# Patient Record
Sex: Female | Born: 1970
Health system: Southern US, Community
[De-identification: ages and names within clinical notes are randomized; demographics above are authoritative.]

## PROBLEM LIST (undated history)

## (undated) DIAGNOSIS — M2550 Pain in unspecified joint: Secondary | ICD-10-CM

## (undated) DIAGNOSIS — E049 Nontoxic goiter, unspecified: Secondary | ICD-10-CM

## (undated) DIAGNOSIS — M549 Dorsalgia, unspecified: Secondary | ICD-10-CM

## (undated) DIAGNOSIS — M7989 Other specified soft tissue disorders: Secondary | ICD-10-CM

## (undated) DIAGNOSIS — K603 Anal fistula, unspecified: Secondary | ICD-10-CM

## (undated) DIAGNOSIS — Z9889 Other specified postprocedural states: Secondary | ICD-10-CM

## (undated) DIAGNOSIS — H43392 Other vitreous opacities, left eye: Secondary | ICD-10-CM

## (undated) DIAGNOSIS — F32A Depression, unspecified: Secondary | ICD-10-CM

## (undated) DIAGNOSIS — I878 Other specified disorders of veins: Secondary | ICD-10-CM

## (undated) DIAGNOSIS — E282 Polycystic ovarian syndrome: Secondary | ICD-10-CM

## (undated) DIAGNOSIS — R112 Nausea with vomiting, unspecified: Secondary | ICD-10-CM

## (undated) DIAGNOSIS — K611 Rectal abscess: Secondary | ICD-10-CM

## (undated) DIAGNOSIS — J449 Chronic obstructive pulmonary disease, unspecified: Secondary | ICD-10-CM

## (undated) DIAGNOSIS — I10 Essential (primary) hypertension: Secondary | ICD-10-CM

## (undated) DIAGNOSIS — R1310 Dysphagia, unspecified: Secondary | ICD-10-CM

## (undated) DIAGNOSIS — G4730 Sleep apnea, unspecified: Secondary | ICD-10-CM

## (undated) DIAGNOSIS — Z973 Presence of spectacles and contact lenses: Secondary | ICD-10-CM

## (undated) DIAGNOSIS — F419 Anxiety disorder, unspecified: Secondary | ICD-10-CM

## (undated) DIAGNOSIS — J302 Other seasonal allergic rhinitis: Secondary | ICD-10-CM

## (undated) DIAGNOSIS — J45909 Unspecified asthma, uncomplicated: Secondary | ICD-10-CM

## (undated) DIAGNOSIS — F329 Major depressive disorder, single episode, unspecified: Secondary | ICD-10-CM

## (undated) HISTORY — DX: Depression, unspecified: F32.A

## (undated) HISTORY — DX: Pain in unspecified joint: M25.50

## (undated) HISTORY — PX: WISDOM TOOTH EXTRACTION: SHX21

## (undated) HISTORY — DX: Sleep apnea, unspecified: G47.30

## (undated) HISTORY — DX: Chronic obstructive pulmonary disease, unspecified: J44.9

## (undated) HISTORY — DX: Dorsalgia, unspecified: M54.9

## (undated) HISTORY — DX: Dysphagia, unspecified: R13.10

## (undated) HISTORY — DX: Major depressive disorder, single episode, unspecified: F32.9

## (undated) HISTORY — DX: Anxiety disorder, unspecified: F41.9

## (undated) HISTORY — DX: Rectal abscess: K61.1

## (undated) HISTORY — DX: Unspecified asthma, uncomplicated: J45.909

## (undated) HISTORY — DX: Essential (primary) hypertension: I10

## (undated) HISTORY — DX: Nontoxic goiter, unspecified: E04.9

## (undated) HISTORY — DX: Polycystic ovarian syndrome: E28.2

## (undated) HISTORY — DX: Other specified soft tissue disorders: M79.89

---
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
## 2000-06-13 ENCOUNTER — Other Ambulatory Visit: Admission: RE | Admit: 2000-06-13 | Discharge: 2000-06-13 | Payer: Self-pay | Admitting: Obstetrics and Gynecology

## 2001-07-28 ENCOUNTER — Other Ambulatory Visit: Admission: RE | Admit: 2001-07-28 | Discharge: 2001-07-28 | Payer: Self-pay | Admitting: Obstetrics and Gynecology

## 2003-02-11 ENCOUNTER — Other Ambulatory Visit: Admission: RE | Admit: 2003-02-11 | Discharge: 2003-02-11 | Payer: Self-pay | Admitting: Gynecology

## 2004-10-24 ENCOUNTER — Other Ambulatory Visit: Admission: RE | Admit: 2004-10-24 | Discharge: 2004-10-24 | Payer: Self-pay | Admitting: Gynecology

## 2005-08-22 ENCOUNTER — Ambulatory Visit (HOSPITAL_BASED_OUTPATIENT_CLINIC_OR_DEPARTMENT_OTHER): Admission: RE | Admit: 2005-08-22 | Discharge: 2005-08-22 | Payer: Self-pay | Admitting: Gynecology

## 2005-08-22 ENCOUNTER — Encounter (INDEPENDENT_AMBULATORY_CARE_PROVIDER_SITE_OTHER): Payer: Self-pay | Admitting: *Deleted

## 2005-08-22 HISTORY — PX: HYSTEROSCOPY WITH D & C: SHX1775

## 2010-02-22 ENCOUNTER — Ambulatory Visit: Payer: Self-pay | Admitting: Family Medicine

## 2010-02-22 DIAGNOSIS — F418 Other specified anxiety disorders: Secondary | ICD-10-CM | POA: Insufficient documentation

## 2010-02-22 DIAGNOSIS — F329 Major depressive disorder, single episode, unspecified: Secondary | ICD-10-CM

## 2010-02-22 DIAGNOSIS — F411 Generalized anxiety disorder: Secondary | ICD-10-CM | POA: Insufficient documentation

## 2010-02-22 DIAGNOSIS — L03319 Cellulitis of trunk, unspecified: Secondary | ICD-10-CM

## 2010-02-22 DIAGNOSIS — I10 Essential (primary) hypertension: Secondary | ICD-10-CM | POA: Insufficient documentation

## 2010-02-22 DIAGNOSIS — L02219 Cutaneous abscess of trunk, unspecified: Secondary | ICD-10-CM | POA: Insufficient documentation

## 2010-02-25 ENCOUNTER — Encounter: Payer: Self-pay | Admitting: Family Medicine

## 2010-02-25 ENCOUNTER — Ambulatory Visit: Payer: Self-pay | Admitting: Emergency Medicine

## 2010-02-28 ENCOUNTER — Telehealth (INDEPENDENT_AMBULATORY_CARE_PROVIDER_SITE_OTHER): Payer: Self-pay | Admitting: *Deleted

## 2010-03-02 ENCOUNTER — Ambulatory Visit: Payer: Self-pay | Admitting: Family Medicine

## 2010-03-03 ENCOUNTER — Encounter: Payer: Self-pay | Admitting: Family Medicine

## 2010-03-05 LAB — CONVERTED CEMR LAB
AST: 19 units/L (ref 0–37)
Albumin: 4 g/dL (ref 3.5–5.2)
Alkaline Phosphatase: 73 units/L (ref 39–117)
Potassium: 4.3 meq/L (ref 3.5–5.3)
Sodium: 137 meq/L (ref 135–145)
Total Bilirubin: 0.3 mg/dL (ref 0.3–1.2)
Total Protein: 6.7 g/dL (ref 6.0–8.3)

## 2010-03-06 ENCOUNTER — Encounter: Payer: Self-pay | Admitting: Family Medicine

## 2010-03-07 LAB — CONVERTED CEMR LAB
LDL Cholesterol: 109 mg/dL — ABNORMAL HIGH (ref 0–99)
Total CHOL/HDL Ratio: 3.9
VLDL: 20 mg/dL (ref 0–40)

## 2010-04-06 ENCOUNTER — Ambulatory Visit: Payer: Self-pay | Admitting: Family Medicine

## 2010-05-08 ENCOUNTER — Encounter: Payer: Self-pay | Admitting: Family Medicine

## 2010-05-08 ENCOUNTER — Ambulatory Visit
Admission: RE | Admit: 2010-05-08 | Discharge: 2010-05-08 | Payer: Self-pay | Source: Home / Self Care | Attending: Family Medicine | Admitting: Family Medicine

## 2010-05-08 LAB — CONVERTED CEMR LAB
BUN: 13 mg/dL (ref 6–23)
Calcium: 9.2 mg/dL (ref 8.4–10.5)
Potassium: 3.8 meq/L (ref 3.5–5.3)
Sodium: 136 meq/L (ref 135–145)

## 2010-06-05 NOTE — Assessment & Plan Note (Signed)
Summary: HTN   Vital Signs:  Patient profile:   40 year old female Height:      64 inches Weight:      284 pounds Pulse rate:   81 / minute BP sitting:   147 / 99  (right arm) Cuff size:   regular  Vitals Entered By: Avon Gully CMA, Duncan Dull) (April 06, 2010 8:10 AM) CC: f/u BP   CC:  f/u BP.  Current Medications (verified): 1)  Mirena 20 Mcg/24hr Iud (Levonorgestrel) 2)  Lisinopril-Hydrochlorothiazide 20-25 Mg Tabs (Lisinopril-Hydrochlorothiazide) .... Take 1 Tablet By Mouth Once A Day  Allergies (verified): No Known Drug Allergies  Comments:  Nurse/Medical Assistant: The patient's medications and allergies were reviewed with the patient and were updated in the Medication and Allergy Lists. Avon Gully CMA, Duncan Dull) (April 06, 2010 8:11 AM)  Physical Exam  General:  Well-developed,well-nourished,in no acute distress; alert,appropriate and cooperative throughout examination Head:  Normocephalic and atraumatic without obvious abnormalities. No apparent alopecia or balding. Lungs:  Normal respiratory effort, chest expands symmetrically. Lungs are clear to auscultation, no crackles or wheezes. Heart:  Normal rate and regular rhythm. S1 and S2 normal without gallop, murmur, click, rub or other extra sounds.   Impression & Recommendations:  Problem # 1:  HYPERTENSION (ICD-401.9) Congratulated her on her weight loss.  Her BP is improved and not improved. Discussed options. She has not had a chance to look at the [facility] diet. She will go over this and continue to work on her weight and f/u in one month. She also didn't take her medsd this AM.  Her updated medication list for this problem includes:    Lisinopril-hydrochlorothiazide 20-25 Mg Tabs (Lisinopril-hydrochlorothiazide) .Marland Kitchen... Take 1 tablet by mouth once a day  Complete Medication List: 1)  Mirena 20 Mcg/24hr Iud (Levonorgestrel) 2)  Lisinopril-hydrochlorothiazide 20-25 Mg Tabs (Lisinopril-hydrochlorothiazide)  .... Take 1 tablet by mouth once a day  Patient Instructions: 1)  Check out the DASH diet.  2)  Please schedule a follow-up appointment in 1 month for blood pressure.  Prescriptions: LISINOPRIL-HYDROCHLOROTHIAZIDE 20-25 MG TABS (LISINOPRIL-HYDROCHLOROTHIAZIDE) Take 1 tablet by mouth once a day  #30 x 2   Entered and Authorized by:   Nani Gasser MD   Signed by:   Nani Gasser MD on 04/06/2010   Method used:   Print then Give to Patient   RxID:   [id number]    Orders Added: 1)  Est. Patient Level III [02542]

## 2010-06-05 NOTE — Assessment & Plan Note (Signed)
Summary: NOV: HTN   Vital Signs:  Patient profile:   40 year old female Height:      64 inches Weight:      287 pounds Pulse rate:   89 / minute BP sitting:   157 / 115  (right arm) Cuff size:   regular  Vitals Entered By: Avon Gully CMA, Duncan Dull) (March 02, 2010 11:10 AM) CC: NP--elevated BOP   CC:  NP--elevated BOP.  History of Present Illness: Has been on and off BP meds depending on her health insurance. Now can afford to be back on meds. Has been off for 4 years.    Habits & Providers  Alcohol-Tobacco-Diet     Alcohol drinks/day: <1     Tobacco Status: quit  Exercise-Depression-Behavior     Does Patient Exercise: no     STD Risk: never     Drug Use: no     Seat Belt Use: always  Allergies: No Known Drug Allergies  Past History:  Past Surgical History: None  Family History: Mother with Lung Ca, HTN, DM, thyroid Fathe with depresssion Sibling with depression  Social History: Consulting civil engineer. Married ot [place] with no kids.  Former Smoker Alcohol use-yes Drug use-no Regular exercise-no Caffine 2-4 cups daily. Smoking Status:  quit Does Patient Exercise:  no STD Risk:  never Drug Use:  no Seat Belt Use:  always  Review of Systems       No fever/sweats/weakness, unexplained weight loss/gain.  No vison changes.  No difficulty hearing/ringing in ears, hay fever/allergies.  No chest pain/discomfort, palpitations.  No Br lump/nipple discharge.  No cough/wheeze.  No blood in BM, nausea/vomiting/diarrhea.  No nighttime urination, leaking urine, unusual vaginal bleeding, discharge (penis or vagina).  No muscle/joint pain. No rash, change in mole.  No HA, memory loss.  No anxiety, sleep d/o, depression.  No easy bruising/bleeding, unexplained lump   Physical Exam  General:  Well-developed,well-nourished,in no acute distress; alert,appropriate and cooperative throughout examination Head:  Normocephalic and atraumatic without obvious abnormalities. No apparent  alopecia or balding. Eyes:  No corneal or conjunctival inflammation noted. EOMI. Perrla. Neck:  No deformities, masses, or tenderness noted. No TM.  Lungs:  Normal respiratory effort, chest expands symmetrically. Lungs are clear to auscultation, no crackles or wheezes. Heart:  Normal rate and regular rhythm. S1 and S2 normal without gallop, murmur, click, rub or other extra sounds. No carotid or abdominal bruits.  Pulses:  Radila 2+ bilat.  Neurologic:  alert & oriented X3.   Skin:  Hair growth in a beard type distrubution.  Cervical Nodes:  No lymphadenopathy noted Psych:  Cognition and judgment appear intact. Alert and cooperative with normal attention span and concentration. No apparent delusions, illusions, hallucinations   Impression & Recommendations:  Problem # 1:  HYPERTENSION (ICD-401.9) Not at goal. Also due for labs as has been 4 years. Has been 5 years since last choleserol check Stat combo med and f/u in 4-6 weeks.  Call if any SE or problems. Warned of potential SE of the meds.  Screen for DM.  Her updated medication list for this problem includes:    Lisinopril-hydrochlorothiazide 20-25 Mg Tabs (Lisinopril-hydrochlorothiazide) .Marland Kitchen... Take 1 tablet by mouth once a day  Orders: T-Comprehensive Metabolic Panel [id number]) T-TSH [id number]) T-Lipid Profile [id number]) T-Glucose, Blood ([id number])  BP today: 157/115 Prior BP: 170/119 (02/25/2010)  Complete Medication List: 1)  Minocycline Hcl 100 Mg Caps (Minocycline hcl) .Marland Kitchen.. 1 by mouth two times a day 2)  Fluconazole 150 Mg Tabs (Fluconazole) .Marland KitchenMarland KitchenMarland Kitchen  One by mouth 3)  Bactrim Ds 800-160 Mg Tabs (Sulfamethoxazole-trimethoprim) .Marland Kitchen.. 1 tab by mouth two times a day for 8 days 4)  Mirena 20 Mcg/24hr Iud (Levonorgestrel) 5)  Lisinopril-hydrochlorothiazide 20-25 Mg Tabs (Lisinopril-hydrochlorothiazide) .... Take 1 tablet by mouth once a day  Patient Instructions: 1)  Start your BP pill in the morning.  2)  Google DASH  diet ( [email or web address].net/ website)    3)  Please schedule a follow-up appointment in 4-6 weeks.  Prescriptions: LISINOPRIL-HYDROCHLOROTHIAZIDE 20-25 MG TABS (LISINOPRIL-HYDROCHLOROTHIAZIDE) Take 1 tablet by mouth once a day  #30 x 1   Entered and Authorized by:   Nani Gasser MD   Signed by:   Nani Gasser MD on 03/02/2010   Method used:   Electronically to        [facility] [phone]* (retail)       [address]       [place], [place]  [address]       Ph: [phone] or [phone]       Fax: [phone]   RxID:   [id number]    Orders Added: 1)  T-Comprehensive Metabolic Panel [[id number]] 2)  T-TSH [[id number]] 3)  T-Lipid Profile [[id number]] 4)  T-Glucose, Blood [[id number]] 5)  New Patient Level III [67341]

## 2010-06-05 NOTE — Assessment & Plan Note (Signed)
Summary: Cyst/boil - L side pevic/inner thigh x 2-3 dys rm 4   Vital Signs:  Patient Profile:   40 Years Old Female CC:      Cyst x 2-3 dys Height:     64 inches Weight:      288 pounds O2 Sat:      100 % O2 treatment:    Room Air Temp:     98.8 degrees F oral Pulse rate:   91 / minute Pulse rhythm:   regular Resp:     16 per minute BP sitting:   148 / 102  (left arm) Cuff size:   regular  Vitals Entered By: Areta Haber CMA (February 22, 2010 7:31 PM)                  Current Allergies: No known allergies History of Present Illness Chief Complaint: Cyst x 2-3 dys History of Present Illness:  Subjective:  Patient complains of 2 day history of tender area left groin.  No drainage.  Has not felt well but no fever.  Current Problems: CELLULITIS, GROIN, LEFT (ICD-682.2) HYPERTENSION (ICD-401.9) DEPRESSION (ICD-311) ANXIETY (ICD-300.00)   Current Meds MINOCYCLINE HCL 100 MG CAPS (MINOCYCLINE HCL) 1 by mouth two times a day FLUCONAZOLE 150 MG TABS (FLUCONAZOLE) One by mouth  REVIEW OF SYSTEMS Constitutional Symptoms      Denies fever, chills, night sweats, weight loss, weight gain, and fatigue.  Eyes       Denies change in vision, eye pain, eye discharge, glasses, contact lenses, and eye surgery. Ear/Nose/Throat/Mouth       Denies hearing loss/aids, change in hearing, ear pain, ear discharge, dizziness, frequent runny nose, frequent nose bleeds, sinus problems, sore throat, hoarseness, and tooth pain or bleeding.  Respiratory       Denies dry cough, productive cough, wheezing, shortness of breath, asthma, bronchitis, and emphysema/COPD.  Cardiovascular       Denies murmurs, chest pain, and tires easily with exhertion.    Gastrointestinal       Denies stomach pain, nausea/vomiting, diarrhea, constipation, blood in bowel movements, and indigestion. Genitourniary       Denies painful urination, kidney stones, and loss of urinary control. Neurological  Denies paralysis, seizures, and fainting/blackouts. Musculoskeletal       Denies muscle pain, joint pain, joint stiffness, decreased range of motion, redness, swelling, muscle weakness, and gout.  Skin       Denies bruising, unusual mles/lumps or sores, and hair/skin or nail changes.      Comments: Cyst/boil - pelvic/inner thigh L side x 2-3 dys Psych       Denies mood changes, temper/anger issues, anxiety/stress, speech problems, depression, and sleep problems.  Past History:  Past Medical History: Anxiety Depression Hypertension   Objective:  No acute distress  Skin:  Left mid inguinal intertriginous region reveals tender indurated erythematous area about 2cm by 4cm; not fluctuant. Assessment New Problems: CELLULITIS, GROIN, LEFT (ICD-682.2) HYPERTENSION (ICD-401.9) DEPRESSION (ICD-311) ANXIETY (ICD-300.00)  ? MRSA  Plan New Medications/Changes: FLUCONAZOLE 150 MG TABS (FLUCONAZOLE) One by mouth  #1 x 0, 02/22/2010, Donna Christen MD MINOCYCLINE HCL 100 MG CAPS (MINOCYCLINE HCL) 1 by mouth two times a day  #20 x 0, 02/22/2010, Donna Christen MD  New Orders: New Patient Level III [phone] Planning Comments:   Begin minocycline.  Begin warm compresses/soaks.  Avoid sun exposure. She states that she has a history of yeast infections after antibiotics; given Rx for Diflucan to hold. Return for increasing pain/swelling/fluctuance. Follow-up  with PCP for hypertension   The patient and/or caregiver has been counseled thoroughly with regard to medications prescribed including dosage, schedule, interactions, rationale for use, and possible side effects and they verbalize understanding.  Diagnoses and expected course of recovery discussed and will return if not improved as expected or if the condition worsens. Patient and/or caregiver verbalized understanding.  Prescriptions: FLUCONAZOLE 150 MG TABS (FLUCONAZOLE) One by mouth  #1 x 0   Entered and Authorized by:   Donna Christen MD    Signed by:   Donna Christen MD on 02/22/2010   Method used:   Print then Give to Patient   RxID:   [id number] MINOCYCLINE HCL 100 MG CAPS (MINOCYCLINE HCL) 1 by mouth two times a day  #20 x 0   Entered and Authorized by:   Donna Christen MD   Signed by:   Donna Christen MD on 02/22/2010   Method used:   Print then Give to Patient   RxID:   [id number]   Orders Added: 1)  New Patient Level III [84696]

## 2010-06-05 NOTE — Progress Notes (Signed)
  Phone Note Outgoing Call Call back at [facility] Phone [phone]   Call placed by: Lajean Saver RN,  February 28, 2010 11:59 AM Call placed to: Patient Action Taken: Phone Call Completed Summary of Call: Callback: Patient notified of culture results. Advised to finish antibiotics prescribed.

## 2010-06-05 NOTE — Assessment & Plan Note (Signed)
Summary: Boil - L groin area rm 3   Vital Signs:  Patient Profile:   40 Years Old Female CC:      Boil -beginning to drain Height:     64 inches Weight:      289 pounds O2 Sat:      100 % O2 treatment:    Room Air Temp:     98.3 degrees F oral Pulse rate:   83 / minute Pulse rhythm:   regular Resp:     16 per minute BP sitting:   170 / 119  (left arm) Cuff size:   regular  Vitals Entered By: Areta Haber CMA (February 25, 2010 12:10 PM)                  Current Allergies: No known allergies History of Present Illness History from: patient Chief Complaint: Boil -beginning to drain History of Present Illness: She is here for a follow up of her boil.  She was here 2 days ago, given Minocin that she has been taking.  She states that it is not any worse.  However is starting to drain that took a lot of pressure off.  She hasn't been using a heating pad as much as she was supposed to.  No pain meds used.  She is feeling mild fatigue, but no F/C/N/V.  She states that she is feeling a little better compared to when she was previously here.  Current Problems: CELLULITIS, GROIN, LEFT (ICD-682.2) HYPERTENSION (ICD-401.9) DEPRESSION (ICD-311) ANXIETY (ICD-300.00)   Current Meds MINOCYCLINE HCL 100 MG CAPS (MINOCYCLINE HCL) 1 by mouth two times a day FLUCONAZOLE 150 MG TABS (FLUCONAZOLE) One by mouth BACTRIM DS 800-160 MG TABS (SULFAMETHOXAZOLE-TRIMETHOPRIM) 1 tab by mouth two times a day for 8 days  REVIEW OF SYSTEMS Constitutional Symptoms      Denies fever, chills, night sweats, weight loss, weight gain, and fatigue.  Eyes       Denies change in vision, eye pain, eye discharge, glasses, contact lenses, and eye surgery. Ear/Nose/Throat/Mouth       Denies hearing loss/aids, change in hearing, ear pain, ear discharge, dizziness, frequent runny nose, frequent nose bleeds, sinus problems, sore throat, hoarseness, and tooth pain or bleeding.  Respiratory       Denies dry cough,  productive cough, wheezing, shortness of breath, asthma, bronchitis, and emphysema/COPD.  Cardiovascular       Denies murmurs, chest pain, and tires easily with exhertion.    Gastrointestinal       Denies stomach pain, nausea/vomiting, diarrhea, constipation, blood in bowel movements, and indigestion. Genitourniary       Denies painful urination, kidney stones, and loss of urinary control. Neurological       Complains of headaches.      Denies paralysis, seizures, and fainting/blackouts. Musculoskeletal       Denies muscle pain, joint pain, joint stiffness, decreased range of motion, redness, swelling, muscle weakness, and gout.  Skin       Complains of unusual moles/lumps or sores.      Denies bruising and hair/skin or nail changes.      Comments: Boil - L groin area  Psych       Denies mood changes, temper/anger issues, anxiety/stress, speech problems, depression, and sleep problems. Other Comments: Pt states boil is beginning to drain but she is experiencing more pain in that area and wanted to make sure everything is alright.    Past History:  Past Medical History: Last updated: 02/22/2010  Anxiety Depression Hypertension Physical Exam General appearance: well developed, well nourished, no acute distress Chest/Lungs: no rales, wheezes, or rhonchi bilateral, breath sounds equal without effort Heart: regular rate and  rhythm, no murmur GU: see below Skin: see below MSE: oriented to time, place, and person Skin: Left mid inguinal intertriginous region reveals mildly tender indurated erythematous area about 2cm by 3cm; not fluctuant.  Drainage is present (serosanguinous, mild purulence) that is cultured.  No other lesions seen  Patient Education: Demonstrates willingness to comply.  Plan New Medications/Changes: BACTRIM DS 800-160 MG TABS (SULFAMETHOXAZOLE-TRIMETHOPRIM) 1 tab by mouth two times a day for 8 days  #16 x 0, 02/25/2010, Hoyt Koch MD  New Orders: Est.  Patient Level IV [95188] T-Culture, Wound [[id number]] Planning Comments:   As per the nurse and previous physician's note, the wound looks better and is draining.  I don't feel that an I&D would help at this point.  Encouraged to do more heating pad, keep area clean and dry.  I added Bactrim to the Minocin for better coverage.  A wound culture is pending and we will call the patient in a few days with culture results.  Ibuprofen for pain.  Seek medical help with worsening symptoms, fever, worsening pain or redness, confusion, etc.   The patient and/or caregiver has been counseled thoroughly with regard to medications prescribed including dosage, schedule, interactions, rationale for use, and possible side effects and they verbalize understanding.  Diagnoses and expected course of recovery discussed and will return if not improved as expected or if the condition worsens. Patient and/or caregiver verbalized understanding.  Prescriptions: BACTRIM DS 800-160 MG TABS (SULFAMETHOXAZOLE-TRIMETHOPRIM) 1 tab by mouth two times a day for 8 days  #16 x 0   Entered and Authorized by:   Hoyt Koch MD   Signed by:   Hoyt Koch MD on 02/25/2010   Method used:   Print then Give to Patient   RxID:   [id number]   Orders Added: 1)  Est. Patient Level IV [35573] 2)  T-Culture, Wound [[id number]]

## 2010-06-07 NOTE — Assessment & Plan Note (Signed)
Summary: F/UP HTN   Vital Signs:  Patient profile:   40 year old female Height:      64 inches Weight:      282 pounds Pulse rate:   73 / minute BP sitting:   130 / 85  (left arm) Cuff size:   large  Vitals Entered By: Avon Gully CMA, (AAMA) (May 08, 2010 10:39 AM) CC: f/u BP   CC:  f/u BP.  Current Medications (verified): 1)  Mirena 20 Mcg/24hr Iud (Levonorgestrel) 2)  Lisinopril-Hydrochlorothiazide 20-25 Mg Tabs (Lisinopril-Hydrochlorothiazide) .... Take 1 Tablet By Mouth Once A Day  Allergies (verified): No Known Drug Allergies  Comments:  Nurse/Medical Assistant: The patient's medications and allergies were reviewed with the patient and were updated in the Medication and Allergy Lists. Avon Gully CMA, Duncan Dull) (May 08, 2010 10:40 AM)  Physical Exam  General:  Well-developed,well-nourished,in no acute distress; alert,appropriate and cooperative throughout examination Head:  Normocephalic and atraumatic without obvious abnormalities. No apparent alopecia or balding. Nose:  External nasal examination shows no deformity or inflammation. Nasal mucosa are pink and moist without lesions or exudates. Mouth:  Oral mucosa and oropharynx without lesions or exudates.  Teeth in good repair. Lungs:  Normal respiratory effort, chest expands symmetrically. Lungs are clear to auscultation, no crackles or wheezes. Heart:  Normal rate and regular rhythm. S1 and S2 normal without gallop, murmur, click, rub or other extra sounds. Skin:  no rashes.   Cervical Nodes:  No lymphadenopathy noted Psych:  Cognition and judgment appear intact. Alert and cooperative with normal attention span and concentration. No apparent delusions, illusions, hallucinations   Impression & Recommendations:  Problem # 1:  HYPERTENSION (ICD-401.9)  Dong very well. At goal. Continue to work on weight loss and this will help her BP tremendously.   Her updated medication list for this  problem includes:    Lisinopril-hydrochlorothiazide 20-25 Mg Tabs (Lisinopril-hydrochlorothiazide) .Marland Kitchen... Take 1 tablet by mouth once a day  Orders: T-Basic Metabolic Panel [id number])  BP today: 130/85 Prior BP: 147/99 (04/06/2010)  Labs Reviewed: K+: 4.3 (03/03/2010) Creat: : 0.75 (03/03/2010)   Chol: 174 (03/06/2010)   HDL: 45 (03/06/2010)   LDL: 109 (03/06/2010)   TG: 100 (03/06/2010)  Complete Medication List: 1)  Mirena 20 Mcg/24hr Iud (Levonorgestrel) 2)  Lisinopril-hydrochlorothiazide 20-25 Mg Tabs (Lisinopril-hydrochlorothiazide) .... Take 1 tablet by mouth once a day  Patient Instructions: 1)  Please schedule a follow-up appointment in 3 months for hypertersion.    Orders Added: 1)  T-Basic Metabolic Panel [[id number]] 2)  Est. Patient Level III [09811]

## 2010-07-02 ENCOUNTER — Ambulatory Visit (INDEPENDENT_AMBULATORY_CARE_PROVIDER_SITE_OTHER): Payer: PRIVATE HEALTH INSURANCE | Admitting: Family Medicine

## 2010-07-02 ENCOUNTER — Encounter: Payer: Self-pay | Admitting: Family Medicine

## 2010-07-02 DIAGNOSIS — J209 Acute bronchitis, unspecified: Secondary | ICD-10-CM

## 2010-07-02 DIAGNOSIS — I10 Essential (primary) hypertension: Secondary | ICD-10-CM

## 2010-07-05 ENCOUNTER — Encounter: Payer: Self-pay | Admitting: Family Medicine

## 2010-07-10 ENCOUNTER — Encounter: Payer: Self-pay | Admitting: Family Medicine

## 2010-07-12 NOTE — Assessment & Plan Note (Signed)
Summary: SORE THROAT,COUGH/WB (rm 5)   Vital Signs:  Patient Profile:   40 Years Old Female CC:      productive cough, congestion x 3 weeks Height:     64 inches Weight:      288 pounds O2 Sat:      96 % O2 treatment:    Room Air Temp:     98.6 degrees F oral Pulse rate:   80 / minute Resp:     20 per minute BP sitting:   153 / 112  (left arm) Cuff size:   large  Vitals Entered By: Lajean Saver RN (July 02, 2010 9:53 AM)                  Updated Prior Medication List: MIRENA 20 MCG/24HR IUD (LEVONORGESTREL)  LISINOPRIL-HYDROCHLOROTHIAZIDE 20-25 MG TABS (LISINOPRIL-HYDROCHLOROTHIAZIDE) Take 1 tablet by mouth once a day  Current Allergies: No known allergies History of Present Illness Chief Complaint: productive cough, congestion x 3 weeks History of Present Illness:  Subjective: Patient complains of URI symptoms for about 3 weeks with persistent sinus congestion and cough.  Cough is worse at night and non-productive No sore throat at present. No pleuritic pain No wheezing ? post-nasal drainage No sinus pain/pressure No itchy/red eyes No earache No hemoptysis No SOB No fever/chills No nausea No vomiting No abdominal pain No diarrhea No skin rashes + fatigue No myalgias No headache Used OTC meds without relief   REVIEW OF SYSTEMS Constitutional Symptoms      Denies fever, chills, night sweats, weight loss, weight gain, and fatigue.  Eyes       Denies change in vision, eye pain, eye discharge, glasses, contact lenses, and eye surgery. Ear/Nose/Throat/Mouth       Complains of sinus problems.      Denies hearing loss/aids, change in hearing, ear pain, ear discharge, dizziness, frequent runny nose, frequent nose bleeds, sore throat, hoarseness, and tooth pain or bleeding.      Comments: congestion Respiratory       Complains of productive cough.      Denies dry cough, wheezing, shortness of breath, asthma, bronchitis, and emphysema/COPD.   Cardiovascular       Denies murmurs, chest pain, and tires easily with exhertion.    Gastrointestinal       Denies stomach pain, nausea/vomiting, diarrhea, constipation, blood in bowel movements, and indigestion. Genitourniary       Denies painful urination, kidney stones, and loss of urinary control. Neurological       Denies paralysis, seizures, and fainting/blackouts. Musculoskeletal       Denies muscle pain, joint pain, joint stiffness, decreased range of motion, redness, swelling, muscle weakness, and gout.  Skin       Denies bruising, unusual mles/lumps or sores, and hair/skin or nail changes.  Psych       Denies mood changes, temper/anger issues, anxiety/stress, speech problems, depression, and sleep problems. Other Comments: symptoms x 3 weeks. used Nyquil and dayquil otc, tylenol version. Denies fever   Past History:  Past Medical History: Reviewed history from 02/22/2010 and no changes required. Anxiety Depression Hypertension  Past Surgical History: Reviewed history from 03/02/2010 and no changes required. None  Family History: Reviewed history from 03/02/2010 and no changes required. Mother with Lung Ca, HTN, DM, thyroid Fathe with depresssion Sibling with depression  Social History: Reviewed history from 03/02/2010 and no changes required. Student. Married ot [place] with no kids.  Former Smoker Alcohol use-yes Drug use-no Regular exercise-no Caffine  2-4 cups daily.    Objective:  No acute distress  Eyes:  Pupils are equal, round, and reactive to light and accomdation.  Extraocular movement is intact.  Conjunctivae are not inflamed.  Ears:  Canals normal.  Tympanic membranes normal.   Nose:  Mildly congested; no sinus tenderness Pharynx:  Normal  Neck:  Supple.  No adenopathy is present.  No thyromegaly is present  Lungs:  Clear to auscultation.  Breath sounds are equal.  Heart:  Regular rate and rhythm without murmurs, rubs, or gallops.  Abdomen:   Nontender without masses or hepatosplenomegaly.  Bowel sounds are present.  No CVA or flank tenderness.  Extremities:  No edema.   Assessment  Assessed HYPERTENSION as deteriorated - Donna Christen MD New Problems: ACUTE BRONCHITIS (ICD-466.0)  ? PERTUSSIS NOTE ELEVATED BP  Plan New Medications/Changes: BENZONATATE 200 MG CAPS (BENZONATATE) One by mouth hs as needed cough  #12 x 0, 07/02/2010, Donna Christen MD AZITHROMYCIN 250 MG TABS (AZITHROMYCIN) Two tabs by mouth on day 1, then 1 tab daily on days 2 through 5  #6 tabs x 0, 07/02/2010, Donna Christen MD  New Orders: Pulse Oximetry (single measurment) [94760] Est. Patient Level III [62952] Planning Comments:   Begin Z-pack, expectorant, topical decongestant, cough suppressant at bedtime.  Increase fluid intake Follow-up with PCP for BP Followup with PCP if not improving 7 to 10 days   The patient and/or caregiver has been counseled thoroughly with regard to medications prescribed including dosage, schedule, interactions, rationale for use, and possible side effects and they verbalize understanding.  Diagnoses and expected course of recovery discussed and will return if not improved as expected or if the condition worsens. Patient and/or caregiver verbalized understanding.  Prescriptions: BENZONATATE 200 MG CAPS (BENZONATATE) One by mouth hs as needed cough  #12 x 0   Entered and Authorized by:   Donna Christen MD   Signed by:   Donna Christen MD on 07/02/2010   Method used:   Print then Give to Patient   RxID:   [id number] AZITHROMYCIN 250 MG TABS (AZITHROMYCIN) Two tabs by mouth on day 1, then 1 tab daily on days 2 through 5  #6 tabs x 0   Entered and Authorized by:   Donna Christen MD   Signed by:   Donna Christen MD on 07/02/2010   Method used:   Print then Give to Patient   RxID:   [id number]   Patient Instructions: 1)  Take Mucinex  (guaifenesin) twice daily for congestion. 2)  Increase fluid intake, rest. 3)   May use Afrin nasal spray (or generic oxymetazoline) twice daily for about 5 days.  Also recommend using saline nasal spray several times daily and/or saline nasal irrigation. 4)    5)  Followup with family doctor if not improving 7 to 10 days.   Orders Added: 1)  Pulse Oximetry (single measurment) [94760] 2)  Est. Patient Level III [56433]

## 2010-08-03 ENCOUNTER — Ambulatory Visit (INDEPENDENT_AMBULATORY_CARE_PROVIDER_SITE_OTHER): Payer: Private Health Insurance - Indemnity | Admitting: Family Medicine

## 2010-08-03 DIAGNOSIS — I10 Essential (primary) hypertension: Secondary | ICD-10-CM

## 2010-08-03 MED ORDER — LISINOPRIL-HYDROCHLOROTHIAZIDE 20-25 MG PO TABS: 1.0000 | ORAL_TABLET | Freq: Every day | ORAL | Status: DC

## 2010-08-03 NOTE — Assessment & Plan Note (Signed)
Discussed that her BP looks great today. Would like to have the lower number a little lower.  Congratulated her on the weight loss. Keep up the good work. Not due for labs until November.  Continue to make changes in her diet as able. She feels really restricted on this because of her husband who is not will to make dietary changes and she cooks mostly what he wants.

## 2010-08-03 NOTE — Progress Notes (Signed)
  Subjective:    Patient ID: Stacey Castaneda, female    DOB: 1970-07-06, 40 y.o.   MRN: [id number]  Hypertension This is a chronic problem. The problem is controlled. Associated symptoms include shortness of breath. Pertinent negatives include no blurred vision, chest pain or headaches. There are no associated agents to hypertension. Risk factors for coronary artery disease include obesity. Past treatments include ACE inhibitors and diuretics. The current treatment provides moderate improvement. There are no compliance problems.    Has bronchitis over the winter and cough for over a month. Went to UC and given ABX and does feel some better.Marland Kitchen  Hx or bronchitis before. She also feels the pollen is irritaitng her. Has noticed some SOB with the heavy pollen this week.     Review of Systems  Eyes: Negative for blurred vision.  Respiratory: Positive for shortness of breath.   Cardiovascular: Negative for chest pain.  Neurological: Negative for headaches.       Objective:   Physical Exam  Constitutional: She appears well-developed and well-nourished.  HENT:  Head: Normocephalic and atraumatic.  Cardiovascular: Normal rate, regular rhythm and normal heart sounds.   Pulmonary/Chest: Effort normal and breath sounds normal.          Assessment & Plan:  Allergies - Rec OTC claritin since this worked well for her a few years ago.  Call if not helping

## 2010-08-14 ENCOUNTER — Other Ambulatory Visit: Payer: Self-pay | Admitting: Obstetrics & Gynecology

## 2010-08-14 ENCOUNTER — Encounter (INDEPENDENT_AMBULATORY_CARE_PROVIDER_SITE_OTHER): Payer: Private Health Insurance - Indemnity | Admitting: Obstetrics & Gynecology

## 2010-08-14 DIAGNOSIS — L680 Hirsutism: Secondary | ICD-10-CM

## 2010-08-14 DIAGNOSIS — Z975 Presence of (intrauterine) contraceptive device: Secondary | ICD-10-CM

## 2010-08-14 DIAGNOSIS — Z124 Encounter for screening for malignant neoplasm of cervix: Secondary | ICD-10-CM

## 2010-08-14 DIAGNOSIS — Z01419 Encounter for gynecological examination (general) (routine) without abnormal findings: Secondary | ICD-10-CM

## 2010-08-14 LAB — HM PAP SMEAR: HM Pap smear: NEGATIVE
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
## 2010-08-17 ENCOUNTER — Other Ambulatory Visit: Payer: Private Health Insurance - Indemnity

## 2010-08-28 ENCOUNTER — Ambulatory Visit: Payer: Private Health Insurance - Indemnity | Admitting: Obstetrics & Gynecology

## 2010-09-07 ENCOUNTER — Ambulatory Visit (INDEPENDENT_AMBULATORY_CARE_PROVIDER_SITE_OTHER): Payer: Private Health Insurance - Indemnity | Admitting: Family Medicine

## 2010-09-07 DIAGNOSIS — I10 Essential (primary) hypertension: Secondary | ICD-10-CM

## 2010-09-07 DIAGNOSIS — J40 Bronchitis, not specified as acute or chronic: Secondary | ICD-10-CM

## 2010-09-07 NOTE — Progress Notes (Signed)
  Subjective:    Patient ID: Stacey Castaneda, female    DOB: 05-25-1970, 40 y.o.   MRN: [id number]  HPI See scanned note. Computer wasn't working.    Review of Systems     Objective:   Physical Exam        Assessment & Plan:

## 2010-09-12 ENCOUNTER — Other Ambulatory Visit: Payer: Self-pay | Admitting: Family Medicine

## 2010-09-12 MED ORDER — LISINOPRIL 20 MG PO TABS: 20.0000 mg | ORAL_TABLET | Freq: Every day | ORAL | Status: DC

## 2010-09-12 NOTE — Telephone Encounter (Signed)
Needs RX for new med lisionpril sent to [facility].  Changed at recent office visit. Plan: Notified Dr. Linford Arnold and she placed script in the system since I could not see the actual dose.  Lisinopril 20 mg po daily sent to [facility] by Dr. Linford Arnold. Stacey Newcomer, LPN [place]

## 2010-09-12 NOTE — Telephone Encounter (Signed)
Ok will change to lisinopril 20mg 

## 2010-09-14 ENCOUNTER — Ambulatory Visit
Admission: RE | Admit: 2010-09-14 | Discharge: 2010-09-14 | Disposition: A | Discharge status: Home / Self Care | Payer: Private Health Insurance - Indemnity | Source: Ambulatory Visit | Attending: Obstetrics & Gynecology | Admitting: Obstetrics & Gynecology

## 2010-09-14 ENCOUNTER — Other Ambulatory Visit: Payer: Private Health Insurance - Indemnity

## 2010-09-14 DIAGNOSIS — Z975 Presence of (intrauterine) contraceptive device: Secondary | ICD-10-CM

## 2010-09-21 LAB — HM MAMMOGRAPHY

## 2010-09-21 NOTE — H&P (Signed)
Stacey Castaneda, COZBY           ACCOUNT NO.:  [id number]   MEDICAL RECORD NO.:  [id number]          PATIENT TYPE:  AMB   LOCATION:  [facility]                         FACILITY:  [facility]   PHYSICIAN:  Timothy P. Fontaine, M.D.DATE OF BIRTH:  [birth year]   DATE OF ADMISSION:  08/22/2005  DATE OF DISCHARGE:                                HISTORY & PHYSICAL   CHIEF COMPLAINT:  Irregular bleeding.   HISTORY OF PRESENT ILLNESS:  A 40 year old G0 who presents with a history of  irregular dysfunctional-type bleeding.  The patient had been evaluated  previously, underwent a progesterone withdrawal late the past summer and  then had no bleeding up until February at which time she had irregular  bleeding on and off.  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  She is admitted at this time for hysteroscopy D&C due  to the questionable polyp and her irregular bleeding history.   PAST MEDICAL HISTORY:  Hypertension, anxiety, polycystic ovarian syndrome.   PAST SURGICAL HISTORY:  D&C 2001.   CURRENT MEDICATIONS:  Hydrochlorothiazide and Cartia XT for her  hypertension, Wellbutrin and Celexa for her anxiety disorder.   ALLERGIES:  NONE.   REVIEW OF SYSTEMS:  Noncontributory.   FAMILY HISTORY:  Noncontributory.   SOCIAL HISTORY:  Noncontributory.   ADMISSION PHYSICAL EXAM:  VITAL SIGNS:  Afebrile.  Vital signs are stable.  HEENT:  Normal.  LUNGS:  Clear.  CARDIAC:  Regular rate with rubs, murmurs, or gallops.  ABDOMEN:  Abdominal exam benign.  PELVIC:  External, BUS, vagina normal.  Cervix grossly normal.  Uterus  grossly normal, midline, and mobile.  Adnexa without masses or tenderness.   ASSESSMENT AND PLAN:  A 40 year old G0 polycystic ovary type history,  irregular bleeding.  Ultrasound suggestive of  endometrial polyp.  Endometrial biopsy is benign for hysteroscopy D&C.  I discussed the proposed  surgery with her to include instrumentation, use of the resectoscope and  D&C.  The risks of infection, prolonged antibiotics, hemorrhage  necessitating transfusion and the risks of transfusion were reviewed with  her.  The risks of  uterine perforation, damage to internal organs including bowel, bladder,  ureters, vessels and nerves necessitating major exploratory reparative  surgeries and future reparative surgeries including ostomy formation was all  discussed, understood and accepted.  The patient's questions were answered  and she is ready to proceed with surgery.      Timothy P. Fontaine, M.D.  Electronically Signed     TPF/MEDQ  D:  08/19/2005  T:  08/19/2005  Job:  [id number]

## 2010-09-21 NOTE — Op Note (Signed)
Stacey Castaneda, Stacey Castaneda           ACCOUNT NO.:  [id number]   MEDICAL RECORD NO.:  [id number]          PATIENT TYPE:  AMB   LOCATION:  [facility]                         FACILITY:  [facility]   PHYSICIAN:  Timothy P. Fontaine, M.D.DATE OF BIRTH:  Dec 08, 1970   DATE OF PROCEDURE:  08/22/2005  DATE OF DISCHARGE:                                 OPERATIVE REPORT   PREOPERATIVE DIAGNOSES:  Irregular bleeding.   POSTOPERATIVE DIAGNOSES:  Irregular bleeding.   PROCEDURE:  Hysteroscopy and dilatation and curettage.   SURGEON:  Timothy P. Fontaine, M.D.   ANESTHETIC:  MAC with 1% lidocaine paracervical block.   COMPLICATIONS:  None.   ESTIMATED BLOOD LOSS:  Minimal.   SPECIMEN:  Endometrial curettings.   FINDINGS:  EUA external BUS of vagina grossly normal, cervix normal.  Bimanual uterus grossly normal in size, exam limited by abdominal girth.  Adnexa without gross masses.  Hysteroscopic is normal noting fundus,  anterior and posterior uterine surfaces, lower uterine segment,  endocervical canal, right and left tubal ostia all visualized.   PROCEDURE:  The patient was taken to the operating room, underwent IV MAC  anesthesia and placed in the low dorsal lithotomy position, received  perineal vaginal preparation with Betadine solution.  Bladder emptied with  in-and-out Foley catheterization. EUA performed.  The patient was draped in  usual fashion.  Cervix visualized with a speculum, anterior lip grasped with  a single-tooth tenaculum and a paracervical block using 1% lidocaine was  placed, a total of 10 mL.  The cervix was then gently gradually dilated to  admit the operative hysteroscope.  Hysteroscopy was performed with normal  findings.  A sharp curettage was then performed. Subsequently, repeat  hysteroscopy showed an empty cavity, uniform sampling, good distension, no  evidence of perforation.  Instruments were removed.  Hemostasis visualized.  Speculum removed.  The patient was placed in  the supine position and taken  to the recovery room in good condition having tolerated procedure well.      Timothy P. Fontaine, M.D.  Electronically Signed     TPF/MEDQ  D:  08/22/2005  T:  08/22/2005  Job:  [id number]

## 2010-09-25 ENCOUNTER — Ambulatory Visit (INDEPENDENT_AMBULATORY_CARE_PROVIDER_SITE_OTHER): Payer: Private Health Insurance - Indemnity | Admitting: Obstetrics & Gynecology

## 2010-09-25 DIAGNOSIS — T193XXA Foreign body in uterus, initial encounter: Secondary | ICD-10-CM

## 2010-09-25 DIAGNOSIS — E282 Polycystic ovarian syndrome: Secondary | ICD-10-CM

## 2010-09-27 NOTE — Assessment & Plan Note (Signed)
NAME:  DEAVEN, BARRON NO.:  [id number]  MEDICAL RECORD NO.:  [id number]           PATIENT TYPE:  LOCATION:  [facility] [place]           FACILITY:  PHYSICIAN:  Elsie Lincoln, MD           DATE OF BIRTH:  DATE OF SERVICE:  09/25/2010                                 CLINIC NOTE  The patient is a 40 year old female who presents for IUD removal and insertion.  The patient's IUD has been in for approximately 5 years. The strings were not seen to a transvaginal ultrasound was done, which showed a normal-sized uterus with a 4-mm double layer endometrial lining.  The ovaries were normal with evidence of small less than 1-cm follicle consistent with polycystic ovarian syndrome.  The patient is on spirolactone for hirsutism.  Her blood pressure is normal today, which can be a problem with being on lisinopril and spironolactone.  The patient was consented for IUD removal and insertion.  Risks include but not limited to bleeding, infection, damage to the uterus.  The patient was placed in dorsal lithotomy position.  A speculum was placed through the vagina.  GC and Chlamydia cultures were taken.  Cervix was cleaned with Betadine with the anterior lip of the cervix grasped with single- tooth tenaculum.  The strings were not in the canal.  At the cervix, an IUD was placed to the fundus approximately 6 cm, and the patient was having uncontrollable pain, so we opted to stop.  The patient was taken to the operating room for IUD removal and insertion with the same risks and benefits.  The patient will receive a letter in the mail for surgery in the next 6 weeks          ______________________________ Elsie Lincoln, MD    KL/MEDQ  D:  09/25/2010  T:  09/26/2010  Job:  [id number]

## 2010-10-08 ENCOUNTER — Other Ambulatory Visit: Payer: Self-pay | Admitting: Obstetrics & Gynecology

## 2010-10-08 ENCOUNTER — Encounter (HOSPITAL_COMMUNITY): Payer: Managed Care, Other (non HMO)

## 2010-10-08 LAB — CBC
HCT: 38.9 % (ref 36.0–46.0)
Hemoglobin: 13.3 g/dL (ref 12.0–15.0)
MCH: 30 pg (ref 26.0–34.0)
MCHC: 34.2 g/dL (ref 30.0–36.0)
MCV: 87.8 fL (ref 78.0–100.0)
RBC: 4.43 MIL/uL (ref 3.87–5.11)

## 2010-10-08 LAB — BASIC METABOLIC PANEL
CO2: 24 mEq/L (ref 19–32)
Calcium: 9.2 mg/dL (ref 8.4–10.5)
Chloride: 104 mEq/L (ref 96–112)
GFR calc Af Amer: >60 mL/min (ref >60)
Glucose, Bld: 92 mg/dL (ref 70–99)
Potassium: 3.6 mEq/L (ref 3.5–5.1)
Sodium: 136 mEq/L (ref 135–145)

## 2010-10-22 ENCOUNTER — Ambulatory Visit: Payer: Managed Care, Other (non HMO) | Admitting: Obstetrics & Gynecology

## 2010-10-22 ENCOUNTER — Ambulatory Visit (HOSPITAL_COMMUNITY)
Admission: RE | Admit: 2010-10-22 | Discharge: 2010-10-22 | Disposition: A | Discharge status: Home / Self Care | Payer: Managed Care, Other (non HMO) | Source: Ambulatory Visit | Attending: Obstetrics & Gynecology | Admitting: Obstetrics & Gynecology

## 2010-10-22 DIAGNOSIS — T8389XA Other specified complication of genitourinary prosthetic devices, implants and grafts, initial encounter: Secondary | ICD-10-CM

## 2010-10-22 DIAGNOSIS — Z30430 Encounter for insertion of intrauterine contraceptive device: Secondary | ICD-10-CM

## 2010-10-22 DIAGNOSIS — Z30433 Encounter for removal and reinsertion of intrauterine contraceptive device: Secondary | ICD-10-CM

## 2010-10-22 HISTORY — PX: OTHER SURGICAL HISTORY: SHX169

## 2010-11-11 NOTE — Op Note (Addendum)
  NAME:  Stacey Castaneda, Stacey Castaneda NO.:  [id number]  MEDICAL RECORD NO.:  [id number]  LOCATION:  [facility]                          FACILITY:  [facility]  PHYSICIAN:  Lesly Dukes, M.D. DATE OF BIRTH:  04/23/1971  DATE OF PROCEDURE:  10/22/2010 DATE OF DISCHARGE:                              OPERATIVE REPORT   PREOPERATIVE DIAGNOSES:  A 40 year old G0, P0 female with a Mirena that has been in situ for 5 years, but is now imbedded and unable to be removed in the office.  The patient also desires another IUD for contraception.  POSTOPERATIVE DIAGNOSES:  A 40 year old G0, P19 female with a Mirena that has been in situ for 5 years, but is now imbedded and unable to be removed in the office.  The patient also desires another IUD for contraception.  PROCEDURE:  Dilation of the cervix, IUD removal, and Mirena IUD insertion.  SURGEON:  Lesly Dukes, MD  ANESTHESIA:  General and local.  FINDINGS:  Uterus sounds to 8 cm.  SPECIMEN:  None.  ESTIMATED BLOOD LOSS:  Minimal.  COMPLICATIONS:  None.  PROCEDURE IN DETAIL:  After informed consent was obtained, the patient was taken to the operating room where general anesthesia was induced. The patient was placed in dorsal lithotomy position and prepared and draped in normal sterile fashion.  Exam under anesthesia was performed with no abnormalities found on exam but limited due to body habitus. The bladder was emptied with a Foley catheter.  A bivalve speculum was placed into the patient's vagina, and the anterior lip of the cervix grasped was with single-tooth tenaculum.  The uterus sounded to 8 cm. The uterine os was gently dilated with half-size Hegar dilators to a 7.5 mm.  Polyp forceps was introduced into the uterus several times, and the IUD was removed successfully without complication.  A Marena IUD was then inserted based on uterine sound measurement.  The strings were cut to 3 cm, 7 mL of 1% lidocaine were injected for  postoperative pain management.  All instruments removed from the patient's vagina.  The cervix was bleeding minimally from the single-tooth tenaculum site.  Silver nitrate was placed on this with good hemostasis.  The patient tolerated the procedure well.  Sponge, lap, instrument, and needle counts were correct x2.  The patient went to the recovery room in stable condition.     Lesly Dukes, M.D.     Lora Paula  D:  10/22/2010  T:  10/23/2010  Job:  [id number]  Electronically Signed by Elsie Lincoln M.D. on 11/11/2010 09:05:02 PM

## 2010-11-29 ENCOUNTER — Encounter: Payer: Self-pay | Admitting: *Deleted

## 2010-11-29 DIAGNOSIS — L680 Hirsutism: Secondary | ICD-10-CM | POA: Insufficient documentation

## 2010-12-05 ENCOUNTER — Encounter: Payer: Managed Care, Other (non HMO) | Admitting: Obstetrics & Gynecology

## 2010-12-12 ENCOUNTER — Ambulatory Visit (INDEPENDENT_AMBULATORY_CARE_PROVIDER_SITE_OTHER): Payer: Managed Care, Other (non HMO) | Admitting: Obstetrics & Gynecology

## 2010-12-12 ENCOUNTER — Encounter: Payer: Self-pay | Admitting: Obstetrics & Gynecology

## 2010-12-12 VITALS — BP 132/86 | HR 75 | Resp 17 | Ht 64.0 in | Wt 275.0 lb

## 2010-12-12 DIAGNOSIS — Z975 Presence of (intrauterine) contraceptive device: Secondary | ICD-10-CM

## 2010-12-12 DIAGNOSIS — L680 Hirsutism: Secondary | ICD-10-CM

## 2010-12-12 NOTE — Progress Notes (Signed)
  Subjective:    Patient ID: Stacey Castaneda, female    DOB: 1970/05/14, 40 y.o.   MRN: [id number]  HPI Stacey Castaneda is a 40 year old female, who presents status has IUD removal insertion. Patient has had lungs and menses lasting day. Patient is having no complaints with IUD. Patient still has problems with hirsutism. She is on spironolactone. She reports some improvement. She still needs to undergo extensive laser therapy, but is unable to afford it at the current time.   Review of Systems  HENT:       Hair growth  Respiratory: Negative.   Cardiovascular: Negative.   Gastrointestinal: Negative.   Genitourinary: Negative.   Musculoskeletal: Negative.   Neurological: Negative.   Psychiatric/Behavioral: Negative.        Objective:   Physical Exam  Constitutional: She is oriented to person, place, and time. She appears well-developed and well-nourished. No distress.  HENT:  Head: Normocephalic and atraumatic.  Eyes: Conjunctivae are normal.  Pulmonary/Chest: Effort normal.  Abdominal: Soft.  Genitourinary: Vagina normal and uterus normal.       IUD strings are 2-3 cm.  Musculoskeletal: Normal range of motion.  Neurological: She is alert and oriented to person, place, and time.  Skin: Skin is warm and dry.       Extensive hiruitism  Psychiatric: She has a normal mood and affect. Her behavior is normal.          Assessment & Plan:  Assessment: 40 year old female, status post IUD removal in replacement in the OR. Plan: 1. IUD seen to be in proper placement. The patient has no complaints. 2. Continue spironolactone. Further hirsutism treatment need a dermatological specialist. 3. Healthcare maintenance and is up-to-date. . Return in one year or when necessary

## 2011-01-11 ENCOUNTER — Other Ambulatory Visit: Payer: Self-pay | Admitting: *Deleted

## 2011-01-11 MED ORDER — LISINOPRIL 20 MG PO TABS: 20.0000 mg | ORAL_TABLET | Freq: Every day | ORAL | Status: DC

## 2011-01-17 ENCOUNTER — Encounter: Payer: Self-pay | Admitting: Family Medicine

## 2011-02-01 ENCOUNTER — Encounter: Payer: Self-pay | Admitting: Family Medicine

## 2011-02-01 ENCOUNTER — Ambulatory Visit (INDEPENDENT_AMBULATORY_CARE_PROVIDER_SITE_OTHER): Payer: Managed Care, Other (non HMO) | Admitting: Family Medicine

## 2011-02-01 VITALS — BP 124/87 | HR 84 | Ht 64.0 in | Wt 277.0 lb

## 2011-02-01 DIAGNOSIS — R05 Cough: Secondary | ICD-10-CM

## 2011-02-01 DIAGNOSIS — R058 Other specified cough: Secondary | ICD-10-CM

## 2011-02-01 DIAGNOSIS — I10 Essential (primary) hypertension: Secondary | ICD-10-CM

## 2011-02-01 DIAGNOSIS — R059 Cough, unspecified: Secondary | ICD-10-CM

## 2011-02-01 NOTE — Progress Notes (Signed)
  Subjective:    Patient ID: Stacey Castaneda, female    DOB: July 02, 1970, 40 y.o.   MRN: [id number]  Hypertension This is a chronic problem. The current episode started more than 1 year ago. The problem is unchanged. The problem is controlled. Associated symptoms include chest pain. Pertinent negatives include no shortness of breath. Agents associated with hypertension include NSAIDs. Past treatments include ACE inhibitors and diuretics. The current treatment provides moderate improvement. There are no compliance problems.     Phlegm in her chest in the Am when she wakes up for one week. Has been sneezing in the AM.No History of allergies. No other URI symptoms. No sore throat. No headaches. She is not taking any cough or cold medications.  Review of Systems  Respiratory: Negative for shortness of breath.   Cardiovascular: Positive for chest pain.       Objective:   Physical Exam  Constitutional: She is oriented to person, place, and time. She appears well-developed and well-nourished.  HENT:  Head: Normocephalic and atraumatic.  Right Ear: External ear normal.  Left Ear: External ear normal.  Nose: Nose normal.  Mouth/Throat: Oropharynx is clear and moist.       TMs and canals are clear.   Eyes: Conjunctivae and EOM are normal. Pupils are equal, round, and reactive to light.  Neck: Neck supple. No thyromegaly present.  Cardiovascular: Normal rate, regular rhythm and normal heart sounds.   Pulmonary/Chest: Effort normal and breath sounds normal. She has no wheezes.  Lymphadenopathy:    She has no cervical adenopathy.  Neurological: She is alert and oriented to person, place, and time.  Skin: Skin is warm and dry.  Psychiatric: She has a normal mood and affect. Her behavior is normal.          Assessment & Plan:  HTN- Well controlled. At goal. Followup in 6 months. She will be due for blood work at that point in time. Her last cholesterol check was 03/06/2010.  Sputum  production - I tried to give her reassurance. This is not likely be a sign of infection in her case. I think she can try an over-the-counter antihistamine such as Claritin or Zyrtec for about a week and see if this improves her symptoms. If not she could also consider treating for reflux since her symptoms are predominantly at night after she goes to bed. She denies having any chest pain or heartburn symptoms. If his symptoms persist over the next 2-3 weeks please call her office and consider chest x-ray for further evaluation. She can also call if she has a fever.

## 2011-03-06 ENCOUNTER — Other Ambulatory Visit: Payer: Self-pay | Admitting: *Deleted

## 2011-03-06 DIAGNOSIS — L680 Hirsutism: Secondary | ICD-10-CM

## 2011-03-06 MED ORDER — SPIRONOLACTONE 50 MG PO TABS: 50.0000 mg | ORAL_TABLET | Freq: Two times a day (BID) | ORAL | Status: DC

## 2011-08-01 ENCOUNTER — Ambulatory Visit: Payer: Managed Care, Other (non HMO) | Admitting: Family Medicine

## 2011-08-06 ENCOUNTER — Other Ambulatory Visit: Payer: Self-pay | Admitting: *Deleted

## 2011-08-09 ENCOUNTER — Ambulatory Visit (INDEPENDENT_AMBULATORY_CARE_PROVIDER_SITE_OTHER): Payer: Managed Care, Other (non HMO) | Admitting: Family Medicine

## 2011-08-09 ENCOUNTER — Encounter: Payer: Self-pay | Admitting: Family Medicine

## 2011-08-09 ENCOUNTER — Other Ambulatory Visit: Payer: Self-pay | Admitting: *Deleted

## 2011-08-09 VITALS — BP 118/72 | HR 86 | Ht 64.0 in | Wt 284.0 lb

## 2011-08-09 DIAGNOSIS — I10 Essential (primary) hypertension: Secondary | ICD-10-CM

## 2011-08-09 DIAGNOSIS — J309 Allergic rhinitis, unspecified: Secondary | ICD-10-CM

## 2011-08-09 MED ORDER — AZELASTINE HCL 0.1 % NA SOLN: 1.0000 | Freq: Two times a day (BID) | NASAL | Status: DC

## 2011-08-09 MED ORDER — LISINOPRIL-HYDROCHLOROTHIAZIDE 20-25 MG PO TABS: 1.0000 | ORAL_TABLET | Freq: Every day | ORAL | Status: DC

## 2011-08-09 NOTE — Progress Notes (Signed)
  Subjective:    Patient ID: Stacey Castaneda, female    DOB: [birth year], 41 y.o.   MRN: [id number]  Hypertension This is a chronic problem. The current episode started more than 1 year ago. The problem is unchanged. Pertinent negatives include no peripheral edema or shortness of breath. Agents associated with hypertension include NSAIDs. Past treatments include ACE inhibitors and diuretics. There are no compliance problems.    she was taking spinal I can't help with her hair growth on her chin this as she feels like it has not really helped her so she would like to just go back to lisinopril HCTZ she was previously on. She still had an old bottle so she is ready we started it when she ran out of lisinopril.   Also having nasal allergies, with post nasal srip. Started claritin about 2 weeks ago. Helping some but still some post nasal drip.  No fever.  Mild cough. Does have SOB with cough occ.    Review of Systems  Respiratory: Negative for shortness of breath.        Objective:   Physical Exam  Constitutional: She is oriented to person, place, and time. She appears well-developed and well-nourished.  HENT:  Head: Normocephalic and atraumatic.  Right Ear: External ear normal.  Left Ear: External ear normal.  Nose: Nose normal.  Mouth/Throat: Oropharynx is clear and moist.       TMs and canals are clear.   Eyes: Conjunctivae and EOM are normal. Pupils are equal, round, and reactive to light.  Neck: Neck supple. No thyromegaly present.  Cardiovascular: Normal rate, regular rhythm and normal heart sounds.   Pulmonary/Chest: Effort normal and breath sounds normal. She has no wheezes.  Lymphadenopathy:    She has no cervical adenopathy.  Neurological: She is alert and oriented to person, place, and time.  Skin: Skin is warm and dry.  Psychiatric: She has a normal mood and affect.          Assessment & Plan:  HTN - Stable and controlled. Refill old med.  D/c old lisinopril. Removed  spironolactone from med list. F/U in 6 months.   AR- uncontrolled.  Will add astelin to your claritin. She wants to avoid steroids for now.  Call if not better in week or runs a fever.

## 2011-08-24 IMAGING — US US TRANSVAGINAL NON-OB
1 series · 13 of 25 positions shown · non-contrast
Comparison: None.

CLINICAL DATA: Left lower quadrant pain.  IUD.  Polycystic ovarian
syndrome.

TRANSABDOMINAL AND TRANSVAGINAL ULTRASOUND OF PELVIS
TECHNIQUE: Both transabdominal and transvaginal ultrasound
examinations of the pelvis were performed.  Transabdominal
technique was performed for global imaging of the pelvis including
uterus, ovaries, adnexal regions, and pelvic cul-de-sac.
It was necessary to proceed with endovaginal exam following the
transabdominal exam to visualize the .

[Series 1: us transvaginal non-ob · 0.30mm/px · 13 of 66 slices shown]
[im 1/66]
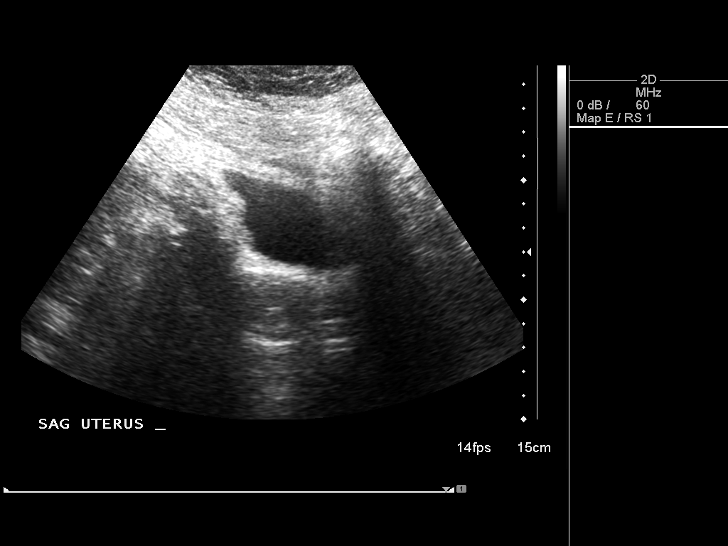
[im 6/66]
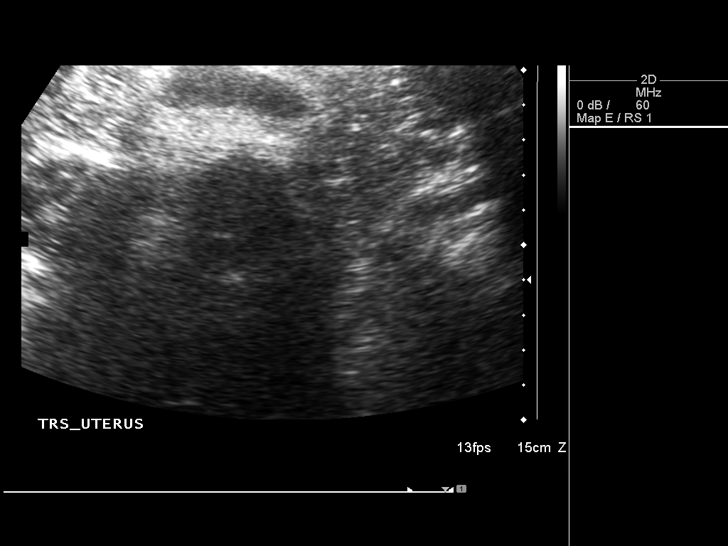
[im 11/66]
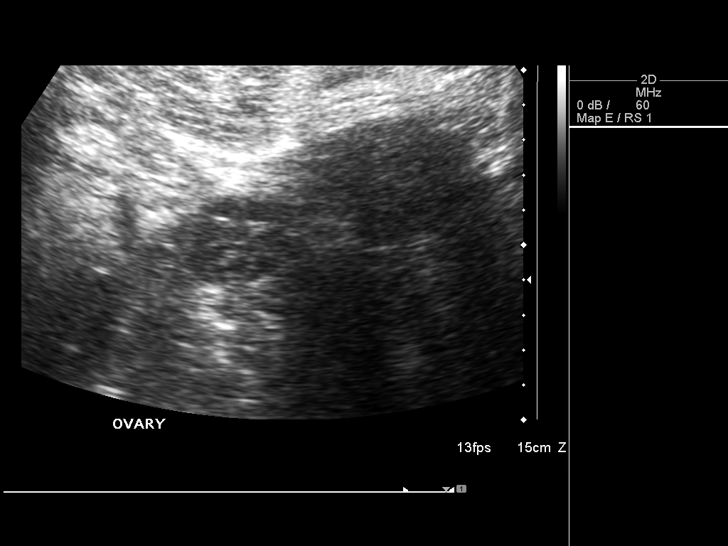
[im 17/66]
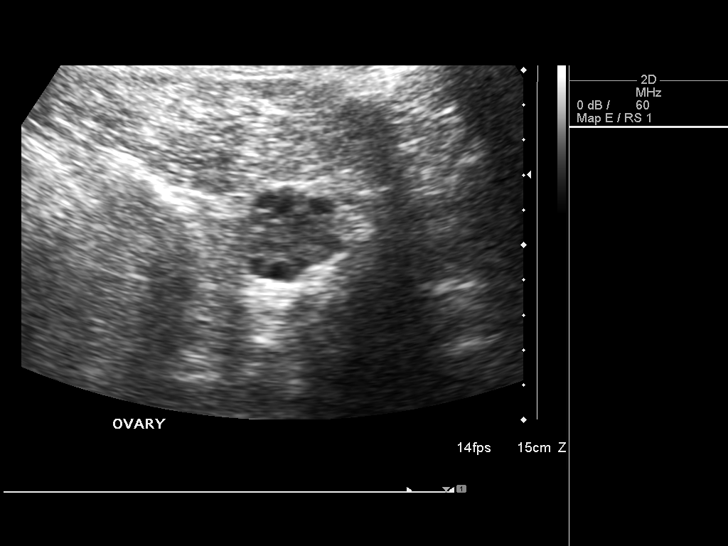
[im 22/66]
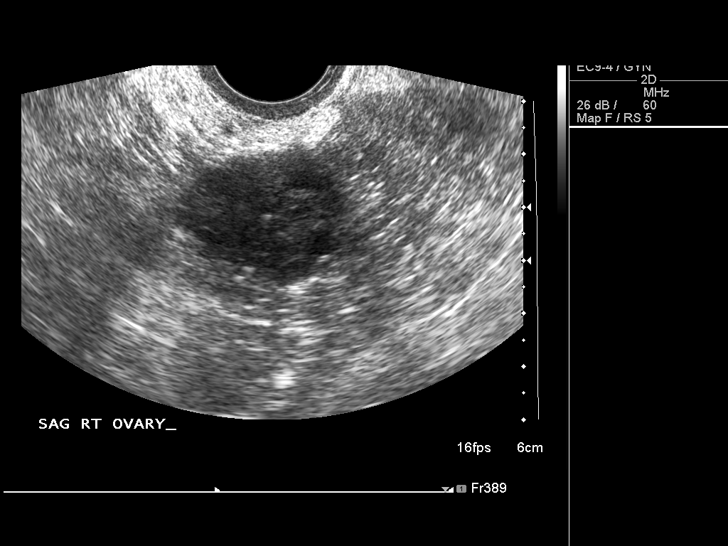
[im 28/66]
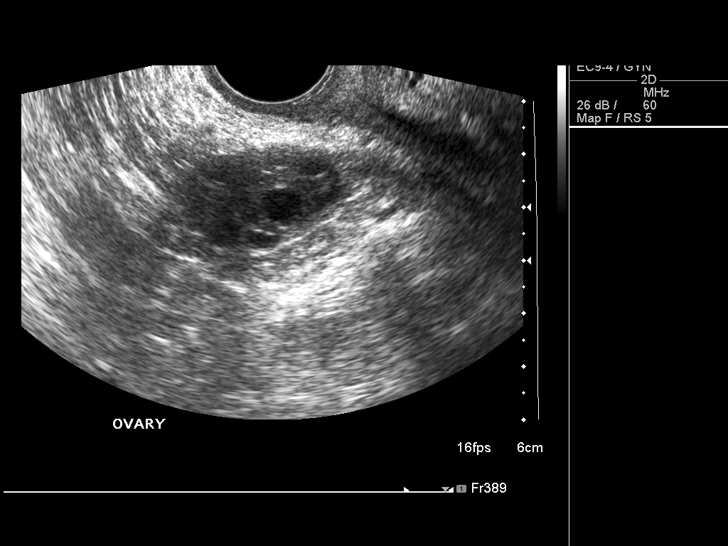
[im 33/66]
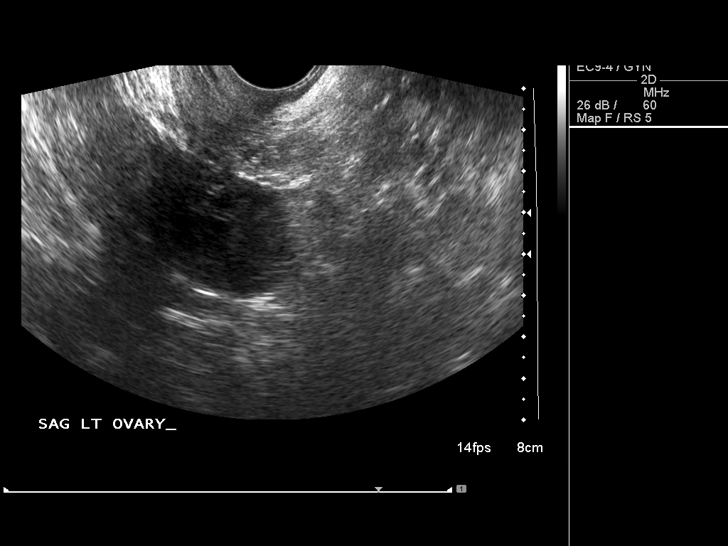
[im 38/66]
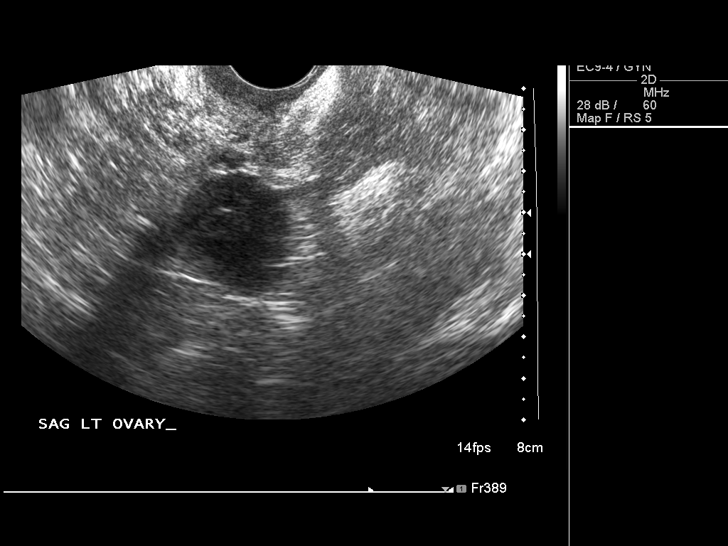
[im 44/66]
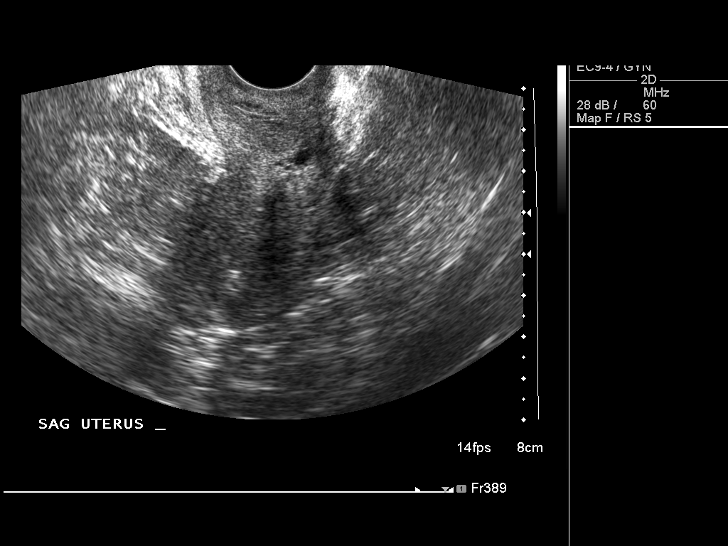
[im 49/66]
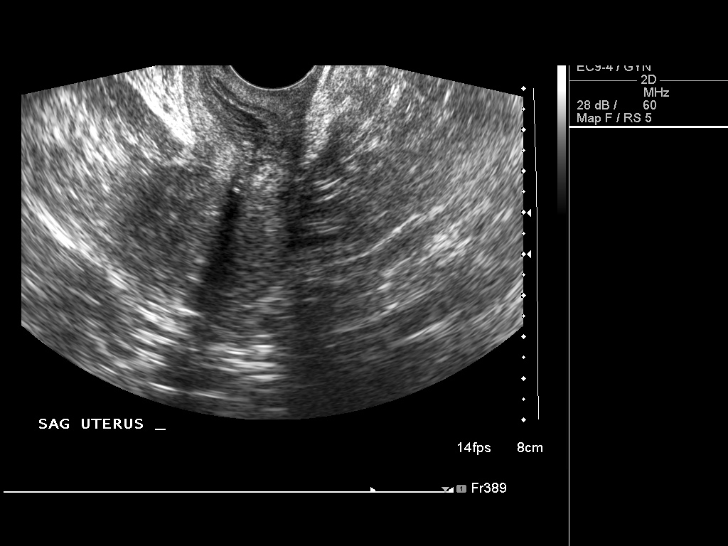
[im 55/66]
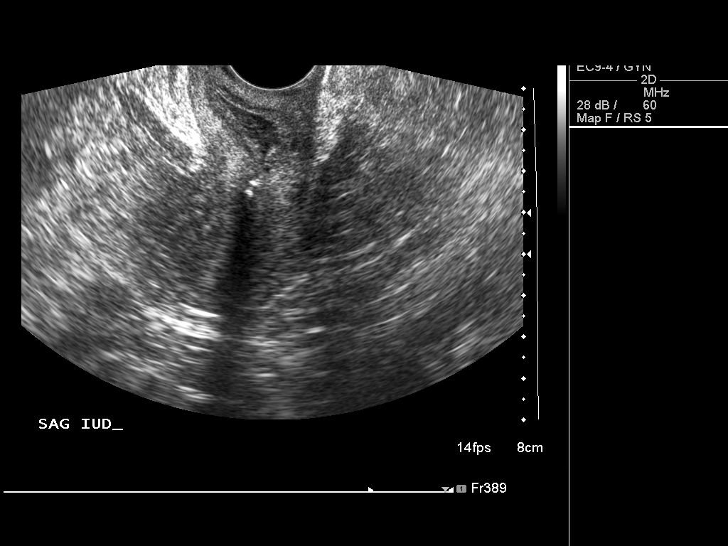
[im 60/66]
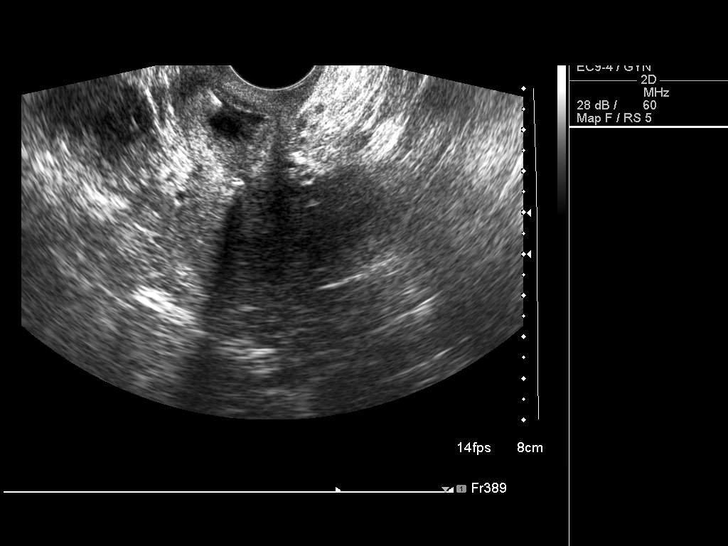
[im 66/66]
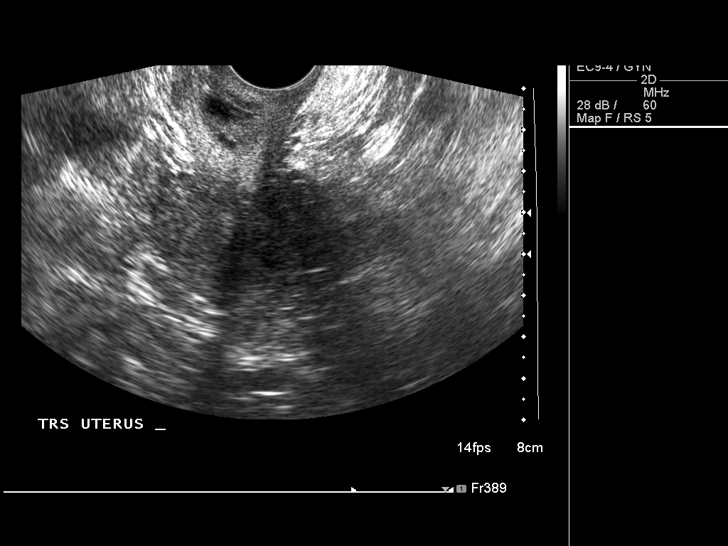

[13 of 25 positions shown; findings below may reference images not displayed]

FINDINGS: Uterus:  6.0 x 4.3 x 4.1 cm.  No fibroids or other uterine masses
identified.

Endometrium:  IUD is seen in normal location in the endometrial
cavity.  4 mm double layer endometrial thickness measured
transvaginally.

Right ovary:  3.4 x 2.5 x 3.0 cm.  No evidence of ovarian mass.
Numerous small, less than 1 cm follicles, without evidence of
dominant follicle or corpus luteum.

Left ovary:  3.0 x 3.1 x 2.6 cm.  No evidence of ovarian mass.
Numerous small, less than 1 cm follicles, without evidence of
dominant follicle or corpus luteum.

Other findings:  No free fluid.
IMPRESSION: 1.  Normal uterus.  IUD in normal location in endometrial cavity.
2.  No evidence of adnexal mass or free fluid.
3.  Numerous small bilateral ovarian follicles, without evidence of
dominant follicle or corpus luteum.  These findings are consistent
with the patient's history ofpolycystic ovary syndrome.

## 2011-09-12 LAB — COMPLETE METABOLIC PANEL WITH GFR
ALT: 30 U/L (ref 0–35)
AST: 29 U/L (ref 0–37)
Chloride: 104 mEq/L (ref 96–112)
Creat: 0.64 mg/dL (ref 0.50–1.10)
Sodium: 137 mEq/L (ref 135–145)
Total Bilirubin: 0.4 mg/dL (ref 0.3–1.2)
Total Protein: 7 g/dL (ref 6.0–8.3)

## 2011-09-12 LAB — LIPID PANEL
HDL: 54 mg/dL (ref >39)
LDL Cholesterol: 125 mg/dL — ABNORMAL HIGH (ref 0–99)
Triglycerides: 90 mg/dL (ref <150)
VLDL: 18 mg/dL (ref 0–40)

## 2011-10-09 ENCOUNTER — Other Ambulatory Visit: Payer: Self-pay | Admitting: Family Medicine

## 2011-11-08 ENCOUNTER — Other Ambulatory Visit: Payer: Self-pay | Admitting: *Deleted

## 2011-11-08 MED ORDER — LISINOPRIL-HYDROCHLOROTHIAZIDE 20-25 MG PO TABS: 1.0000 | ORAL_TABLET | Freq: Every day | ORAL | Status: DC

## 2012-02-07 ENCOUNTER — Ambulatory Visit (INDEPENDENT_AMBULATORY_CARE_PROVIDER_SITE_OTHER): Payer: Managed Care, Other (non HMO) | Admitting: Family Medicine

## 2012-02-07 ENCOUNTER — Encounter: Payer: Self-pay | Admitting: Family Medicine

## 2012-02-07 ENCOUNTER — Other Ambulatory Visit: Payer: Self-pay | Admitting: Family Medicine

## 2012-02-07 VITALS — BP 135/87 | HR 96 | Ht 64.0 in | Wt 286.0 lb

## 2012-02-07 DIAGNOSIS — D229 Melanocytic nevi, unspecified: Secondary | ICD-10-CM

## 2012-02-07 DIAGNOSIS — R05 Cough: Secondary | ICD-10-CM

## 2012-02-07 DIAGNOSIS — E785 Hyperlipidemia, unspecified: Secondary | ICD-10-CM

## 2012-02-07 DIAGNOSIS — J309 Allergic rhinitis, unspecified: Secondary | ICD-10-CM

## 2012-02-07 DIAGNOSIS — D239 Other benign neoplasm of skin, unspecified: Secondary | ICD-10-CM

## 2012-02-07 DIAGNOSIS — R059 Cough, unspecified: Secondary | ICD-10-CM

## 2012-02-07 DIAGNOSIS — I10 Essential (primary) hypertension: Secondary | ICD-10-CM

## 2012-02-07 MED ORDER — LISINOPRIL-HYDROCHLOROTHIAZIDE 20-25 MG PO TABS: 1.0000 | ORAL_TABLET | Freq: Every day | ORAL | Status: DC

## 2012-02-07 MED ORDER — AZELASTINE HCL 0.1 % NA SOLN: 1.0000 | Freq: Two times a day (BID) | NASAL | Status: DC

## 2012-02-07 NOTE — Progress Notes (Signed)
  Subjective:    Patient ID: Stacey Castaneda, female    DOB: 02-04-1971, 41 y.o.   MRN: [id number]  HPI Atypical mole on the left upper shoulder. Say had a dark mole that she feels has changes colors and is concerned.  Has been there for years but has changed. No painful or draining.   Hypertension-no chest pain or shortness of breath. She's taking her medications regularly. She has no problems or concerns.  She's also noticed a dry intermittent cough since she had sinusitis about 5 weeks ago. Says is still hanging around. She otherwise feels well and feels like her sinuses are back to normal. No fevers. The cough is occasionally productive. It does seem to come in fits.  No heartburn or reflux. She is taking an ACE inhibitor.  Review of Systems     Objective:   Physical Exam  Constitutional: She is oriented to person, place, and time. She appears well-developed and well-nourished.  HENT:  Head: Normocephalic and atraumatic.  Cardiovascular: Normal rate, regular rhythm and normal heart sounds.   Pulmonary/Chest: Effort normal and breath sounds normal.  Neurological: She is alert and oriented to person, place, and time.  Skin: Skin is warm and dry.  Psychiatric: She has a normal mood and affect. Her behavior is normal.          Assessment & Plan:  HTN- well controlled. continue current regimen. F/U  In 6 months.  Check BMP.  Cough - new diagnosis. could just be a postviral from her recent sinusitis but could also be related to the ACE inhibitor. Since her lung exam is normal to anchor to get a couple more weeks and see if it resolves. If not then we'll change her to losartan HCTZ.  AR- well-controlled. Needs refill on astelin.    Atypical nevus-discussed options. I recommend biopsy because it has changed color. It him as has the appearance of a venous lake.  Shave Biopsy Procedure Note  Pre-operative Diagnosis: Suspicious lesion  Post-operative Diagnosis:  same  Locations:left upper extremity  Indications: change in color  Anesthesia: Lidocaine 1% with epinephrine without added sodium bicarbonate  Procedure Details  History of allergy to iodine: no  Patient informed of the risks (including bleeding and infection) and benefits of the  procedure and Verbal informed consent obtained.  The lesion and surrounding area were given a sterile prep using betadyne and draped in the usual sterile fashion. A scalpel was used to shave an area of skin approximately 0.3cm by 0.3cm.  Hemostasis achieved with alumuninum chloride. Antibiotic ointment and a sterile dressing applied.  The specimen was sent for pathologic examination. The patient tolerated the procedure well.  EBL: 0 ml  Findings: It was deeper than originallly though so did a second shave to go deeper.   Condition: Stable  Complications: none.  Plan: 1. Instructed to keep the wound dry and covered for 24-48h and clean thereafter. 2. Warning signs of infection were reviewed.   3. Recommended that the patient use OTC acetaminophen as needed for pain.  4. Return in prn  5. F/u wound care instructions give.

## 2012-02-07 NOTE — Patient Instructions (Addendum)
Keep covered for 24 hours.  Clean wound with soap and water. No peroxide or alcohol.   Then place vaseline and cover for one week Then continue to apply vaseline and don't have to cover.  Call if any sign of infection.

## 2012-02-21 LAB — BASIC METABOLIC PANEL WITH GFR
BUN: 12 mg/dL (ref 6–23)
Calcium: 9.4 mg/dL (ref 8.4–10.5)
Creat: 0.56 mg/dL (ref 0.50–1.10)
GFR, Est African American: >89 mL/min
GFR, Est Non African American: >89 mL/min

## 2012-06-11 ENCOUNTER — Ambulatory Visit (INDEPENDENT_AMBULATORY_CARE_PROVIDER_SITE_OTHER): Payer: Managed Care, Other (non HMO) | Admitting: Family Medicine

## 2012-06-11 ENCOUNTER — Encounter: Payer: Self-pay | Admitting: Family Medicine

## 2012-06-11 VITALS — BP 145/96 | HR 90 | Ht 64.0 in | Wt 290.0 lb

## 2012-06-11 DIAGNOSIS — E010 Iodine-deficiency related diffuse (endemic) goiter: Secondary | ICD-10-CM

## 2012-06-11 DIAGNOSIS — E049 Nontoxic goiter, unspecified: Secondary | ICD-10-CM

## 2012-06-11 DIAGNOSIS — R030 Elevated blood-pressure reading, without diagnosis of hypertension: Secondary | ICD-10-CM

## 2012-06-11 DIAGNOSIS — IMO0001 Reserved for inherently not codable concepts without codable children: Secondary | ICD-10-CM

## 2012-06-11 DIAGNOSIS — J329 Chronic sinusitis, unspecified: Secondary | ICD-10-CM

## 2012-06-11 MED ORDER — AMOXICILLIN-POT CLAVULANATE 875-125 MG PO TABS: 1.0000 | ORAL_TABLET | Freq: Two times a day (BID) | ORAL | Status: DC

## 2012-06-11 NOTE — Patient Instructions (Addendum)

## 2012-06-11 NOTE — Progress Notes (Signed)
  Subjective:    Patient ID: Stacey Castaneda, female    DOB: 05/06/1971, 42 y.o.   MRN: [id number]  HPI 4 weeks of sinus congestion, cough.  No bodyaches.  Lots of sneezing initially.  She says feels like had a cold initally but can't get rid of nasal sxs.  Lots of postnasal drip.  Has had facial pain and pressure.  Only had HA a couple of days.  + ear pressure, but no pain.  Says her chest sxs are better overall.  Taking OTC nyquail and dayquail.  She feels like her lower neck is swollen.    Review of Systems     Objective:   Physical Exam  Constitutional: She is oriented to person, place, and time. She appears well-developed and well-nourished.  HENT:  Head: Normocephalic and atraumatic.  Right Ear: External ear normal.  Left Ear: External ear normal.  Nose: Nose normal.  Mouth/Throat: Oropharynx is clear and moist.       TMs and canals are clear.   Eyes: Conjunctivae normal and EOM are normal. Pupils are equal, round, and reactive to light.  Neck: Neck supple. Thyromegaly present.       Thyroid feels larger on the right.   Cardiovascular: Normal rate, regular rhythm and normal heart sounds.   Pulmonary/Chest: Effort normal and breath sounds normal. She has no wheezes.  Musculoskeletal: She exhibits no edema.  Lymphadenopathy:    She has no cervical adenopathy.  Neurological: She is alert and oriented to person, place, and time.  Skin: Skin is warm and dry.  Psychiatric: She has a normal mood and affect.          Assessment & Plan:  Sinusitis - Will tx with augmentin. Call if nto better in one week.  Stop the OTC cough and cold medications since inc BP.  Call sooner she feels she's getting worse or becomes more short of breath or notices any wheezing.Marland Kitchen   HTN- Uncontrolled today. Recheck in 1 month once off cold medicataion.   Thyromegaly-neutrophilic her thyroid gland is larger on the right. We'll check a TSH and schedule for ultrasound for further evaluation. She does  have a family history of thyroid problems.

## 2012-06-12 NOTE — Progress Notes (Signed)
Quick Note:  All labs are normal. ______ 

## 2012-06-19 ENCOUNTER — Telehealth: Payer: Self-pay | Admitting: *Deleted

## 2012-06-19 MED ORDER — AZITHROMYCIN 250 MG PO TABS: ORAL_TABLET | ORAL | Status: DC

## 2012-06-19 NOTE — Telephone Encounter (Signed)
Stacey Castaneda left a message that she was told to call if she wasn't feeling better. Stacey Castaneda states on her message that she is feeling better but now she has green mucus as opposed to clear. She wanted Dr. Linford Arnold to know that.Marland KitchenMarland Kitchen

## 2012-06-19 NOTE — Telephone Encounter (Signed)
Pt.notified

## 2012-06-19 NOTE — Telephone Encounter (Signed)
Ok will change to zpack for atypical coverage. Finish the augmenting as well as taking new ABX.

## 2012-06-25 ENCOUNTER — Ambulatory Visit (HOSPITAL_BASED_OUTPATIENT_CLINIC_OR_DEPARTMENT_OTHER)
Admission: RE | Admit: 2012-06-25 | Discharge: 2012-06-25 | Disposition: A | Discharge status: Home / Self Care | Payer: Managed Care, Other (non HMO) | Source: Ambulatory Visit | Attending: Family Medicine | Admitting: Family Medicine

## 2012-06-25 ENCOUNTER — Telehealth: Payer: Self-pay | Admitting: *Deleted

## 2012-06-25 DIAGNOSIS — E010 Iodine-deficiency related diffuse (endemic) goiter: Secondary | ICD-10-CM

## 2012-06-25 DIAGNOSIS — E042 Nontoxic multinodular goiter: Secondary | ICD-10-CM

## 2012-06-25 NOTE — Telephone Encounter (Signed)
Pt would like the needle bx order sent to [facility] [place] [facility].

## 2012-06-26 NOTE — Telephone Encounter (Signed)
What kind of needles does she need? She is not on insulin. Can you clarify.

## 2012-06-29 NOTE — Telephone Encounter (Signed)
Needle biopsy order.  Sorry.

## 2012-06-29 NOTE — Telephone Encounter (Signed)
Order placed

## 2012-06-30 ENCOUNTER — Other Ambulatory Visit: Payer: Self-pay | Admitting: Family Medicine

## 2012-06-30 DIAGNOSIS — E042 Nontoxic multinodular goiter: Secondary | ICD-10-CM

## 2012-07-07 ENCOUNTER — Ambulatory Visit
Admission: RE | Admit: 2012-07-07 | Discharge: 2012-07-07 | Disposition: A | Discharge status: Home / Self Care | Payer: Managed Care, Other (non HMO) | Source: Ambulatory Visit | Attending: Family Medicine | Admitting: Family Medicine

## 2012-07-07 ENCOUNTER — Other Ambulatory Visit (HOSPITAL_COMMUNITY)
Admission: RE | Admit: 2012-07-07 | Discharge: 2012-07-07 | Disposition: A | Discharge status: Home / Self Care | Payer: Managed Care, Other (non HMO) | Source: Ambulatory Visit | Attending: Interventional Radiology | Admitting: Interventional Radiology

## 2012-07-07 DIAGNOSIS — E042 Nontoxic multinodular goiter: Secondary | ICD-10-CM

## 2012-07-07 DIAGNOSIS — E049 Nontoxic goiter, unspecified: Secondary | ICD-10-CM | POA: Insufficient documentation

## 2012-07-08 ENCOUNTER — Encounter: Payer: Self-pay | Admitting: Family Medicine

## 2012-07-08 DIAGNOSIS — E042 Nontoxic multinodular goiter: Secondary | ICD-10-CM | POA: Insufficient documentation

## 2012-08-11 ENCOUNTER — Ambulatory Visit (INDEPENDENT_AMBULATORY_CARE_PROVIDER_SITE_OTHER): Payer: Managed Care, Other (non HMO) | Admitting: Family Medicine

## 2012-08-11 ENCOUNTER — Encounter: Payer: Self-pay | Admitting: Family Medicine

## 2012-08-11 ENCOUNTER — Other Ambulatory Visit: Payer: Self-pay | Admitting: *Deleted

## 2012-08-11 VITALS — BP 140/87 | HR 85 | Ht 64.0 in | Wt 288.0 lb

## 2012-08-11 DIAGNOSIS — R058 Other specified cough: Secondary | ICD-10-CM

## 2012-08-11 DIAGNOSIS — R05 Cough: Secondary | ICD-10-CM

## 2012-08-11 DIAGNOSIS — I10 Essential (primary) hypertension: Secondary | ICD-10-CM

## 2012-08-11 DIAGNOSIS — R059 Cough, unspecified: Secondary | ICD-10-CM

## 2012-08-11 MED ORDER — ALPRAZOLAM 0.25 MG PO TABS: 0.2500 mg | ORAL_TABLET | Freq: Every day | ORAL | Status: DC | PRN

## 2012-08-11 MED ORDER — LOSARTAN POTASSIUM-HCTZ 100-25 MG PO TABS: 1.0000 | ORAL_TABLET | Freq: Every day | ORAL | Status: DC

## 2012-08-11 NOTE — Progress Notes (Signed)
  Subjective:    Patient ID: Stacey Castaneda, female    DOB: 1971/01/09, 42 y.o.   MRN: [id number]  HPI HTN -  Pt denies chest pain, SOB, dizziness, or heart palpitations.  Taking meds as directed w/o problems.  Denies medication side effects. She does feel like the cough could be coming from her ACE inhibitor. She has had some recurrent bronchitis episodes but has still had a lingering cough. She would like to try switching medications to see if this could be causing it. No shortness of breath. No fever.    Review of Systems     Objective:   Physical Exam  Constitutional: She is oriented to person, place, and time. She appears well-developed and well-nourished.  HENT:  Head: Normocephalic and atraumatic.  Cardiovascular: Normal rate, regular rhythm and normal heart sounds.   Pulmonary/Chest: Effort normal and breath sounds normal.  Neurological: She is alert and oriented to person, place, and time.  Skin: Skin is warm and dry.  Psychiatric: She has a normal mood and affect. Her behavior is normal.          Assessment & Plan:  Hypertension-well-controlled today. We will switch her ACE inhibitor to an ARB. Explained her that it can take several weeks for the cough to resolve if it is coming from the ACE inhibitors. Followup in 60 days to recheck blood pressure make sure that she's tolerating it well. Hopefully her cough will improve significantly.

## 2012-09-11 ENCOUNTER — Ambulatory Visit (INDEPENDENT_AMBULATORY_CARE_PROVIDER_SITE_OTHER): Payer: Managed Care, Other (non HMO) | Admitting: Family Medicine

## 2012-09-11 ENCOUNTER — Encounter: Payer: Self-pay | Admitting: Family Medicine

## 2012-09-11 VITALS — BP 129/82 | HR 98 | Wt 285.0 lb

## 2012-09-11 DIAGNOSIS — J309 Allergic rhinitis, unspecified: Secondary | ICD-10-CM

## 2012-09-11 MED ORDER — FLUTICASONE PROPIONATE 50 MCG/ACT NA SUSP: 2.0000 | Freq: Every day | NASAL | Status: DC

## 2012-09-11 NOTE — Progress Notes (Signed)
  Subjective:    Patient ID: Stacey Castaneda, female    DOB: 1970-05-23, 42 y.o.   MRN: [id number]  HPI Cough x 1 weeks.  Now getting yellow sputum.  Has had clear nasal drianage.  She is taking claritin but plans to change to zyrtec. Tender to get sinus infection. Mild frontal pressure on face.  +some wheezing. No SOB.  No fever.  Mild congestion, worse at night. + postnasal drip.    Review of Systems     Objective:   Physical Exam  Constitutional: She is oriented to person, place, and time. She appears well-developed and well-nourished.  HENT:  Head: Normocephalic and atraumatic.  Cardiovascular: Normal rate, regular rhythm and normal heart sounds.   Pulmonary/Chest: Effort normal and breath sounds normal.  Neurological: She is alert and oriented to person, place, and time.  Skin: Skin is warm and dry.  Psychiatric: She has a normal mood and affect. Her behavior is normal.          Assessment & Plan:  AR - She is changing to zyrtec. Will add nasal steroid spray. She is trying some new cough medicine.   Call if not better in 5 days or  If suddenly worse. If not  Better will tx for sinusitis.  Continue using her nasal saline as well.

## 2012-09-14 ENCOUNTER — Telehealth: Payer: Self-pay | Admitting: *Deleted

## 2012-09-14 MED ORDER — AMOXICILLIN-POT CLAVULANATE 875-125 MG PO TABS: 1.0000 | ORAL_TABLET | Freq: Two times a day (BID) | ORAL | Status: DC

## 2012-09-14 NOTE — Telephone Encounter (Signed)
LMOM that med sent to [facility] in [place]. Barry Dienes, LPN

## 2012-09-14 NOTE — Telephone Encounter (Signed)
Ok will send over ABX.

## 2012-09-14 NOTE — Telephone Encounter (Signed)
Pt calls this morning and states you told her to call back if no better on Monday. Pt states she is not any better, has a lot of phlegm that has turned dark yellow and coughing. Would antibiotic sent to pharmacy. Barry Dienes, LPN

## 2012-10-04 DIAGNOSIS — K611 Rectal abscess: Secondary | ICD-10-CM

## 2012-10-04 HISTORY — DX: Rectal abscess: K61.1

## 2012-10-13 ENCOUNTER — Ambulatory Visit (INDEPENDENT_AMBULATORY_CARE_PROVIDER_SITE_OTHER): Payer: Managed Care, Other (non HMO) | Admitting: Family Medicine

## 2012-10-13 ENCOUNTER — Encounter: Payer: Self-pay | Admitting: Family Medicine

## 2012-10-13 VITALS — BP 122/75 | HR 86 | Wt 289.0 lb

## 2012-10-13 DIAGNOSIS — J309 Allergic rhinitis, unspecified: Secondary | ICD-10-CM

## 2012-10-13 DIAGNOSIS — E785 Hyperlipidemia, unspecified: Secondary | ICD-10-CM

## 2012-10-13 DIAGNOSIS — I10 Essential (primary) hypertension: Secondary | ICD-10-CM

## 2012-10-13 MED ORDER — LOSARTAN POTASSIUM-HCTZ 100-25 MG PO TABS: 1.0000 | ORAL_TABLET | Freq: Every day | ORAL | Status: DC

## 2012-10-13 NOTE — Progress Notes (Signed)
  Subjective:    Patient ID: Stacey Castaneda, female    DOB: 30-Jun-1970, 42 y.o.   MRN: [id number]  HPI HTN - Says dry tickle cough is gone. Doing well on new medication.  No CP, SOB, or palps. Tolerating new med w/o S.E.   AR - Says does feel better on the zyrtec and nasal steroid spray. Says she did stop the saline spray.  Says has been much better up until about 4 days ago. Has noticed a bloody d/c on one side.     Review of Systems     Objective:   Physical Exam  Constitutional: She is oriented to person, place, and time. She appears well-developed and well-nourished.  HENT:  Head: Normocephalic and atraumatic.  Right Ear: External ear normal.  Left Ear: External ear normal.  Nose: Nose normal.  Mouth/Throat: Oropharynx is clear and moist.  TMs and canals are clear.   Eyes: Conjunctivae and EOM are normal. Pupils are equal, round, and reactive to light.  Neck: Neck supple. No thyromegaly present.  Cardiovascular: Normal rate, regular rhythm and normal heart sounds.   Pulmonary/Chest: Effort normal and breath sounds normal. She has no wheezes.  Lymphadenopathy:    She has no cervical adenopathy.  Neurological: She is alert and oriented to person, place, and time.  Skin: Skin is warm and dry.  Psychiatric: She has a normal mood and affect.          Assessment & Plan:  HTN - Well controlled.  F/U in 6 months. BP looks fantastic today.  Due for CMP and lipids.   AR - Make sure using the flonase daily.  Can add back the nasal saline. If mucous increases or facial pain or fever then call and we can tx for acute sinusitis.   Hyperlipidemia- due to recheck lipids. Lab slip given today. She's not currently on any therapy for this.

## 2012-10-30 ENCOUNTER — Encounter (HOSPITAL_COMMUNITY): Payer: Self-pay | Admitting: Anesthesiology

## 2012-10-30 ENCOUNTER — Encounter (INDEPENDENT_AMBULATORY_CARE_PROVIDER_SITE_OTHER): Payer: Self-pay | Admitting: General Surgery

## 2012-10-30 ENCOUNTER — Observation Stay (HOSPITAL_COMMUNITY): Payer: Managed Care, Other (non HMO) | Admitting: Anesthesiology

## 2012-10-30 ENCOUNTER — Observation Stay (HOSPITAL_COMMUNITY)
Admission: AD | Admit: 2012-10-30 | Discharge: 2012-10-31 | Disposition: A | Discharge status: Home / Self Care | Payer: Managed Care, Other (non HMO) | Source: Ambulatory Visit | Attending: General Surgery | Admitting: General Surgery

## 2012-10-30 ENCOUNTER — Encounter (HOSPITAL_COMMUNITY): Admission: AD | Disposition: A | Discharge status: Home / Self Care | Payer: Self-pay | Source: Ambulatory Visit

## 2012-10-30 ENCOUNTER — Encounter: Payer: Self-pay | Admitting: Family Medicine

## 2012-10-30 ENCOUNTER — Ambulatory Visit (INDEPENDENT_AMBULATORY_CARE_PROVIDER_SITE_OTHER): Payer: Managed Care, Other (non HMO) | Admitting: Family Medicine

## 2012-10-30 ENCOUNTER — Ambulatory Visit (INDEPENDENT_AMBULATORY_CARE_PROVIDER_SITE_OTHER): Payer: Private Health Insurance - Indemnity | Admitting: General Surgery

## 2012-10-30 ENCOUNTER — Encounter (HOSPITAL_COMMUNITY): Payer: Self-pay | Admitting: Surgery

## 2012-10-30 VITALS — BP 140/82 | HR 92 | Temp 101.0°F | Wt 288.0 lb

## 2012-10-30 VITALS — BP 140/104 | HR 88 | Temp 102.3°F | Resp 16 | Ht 64.0 in | Wt 288.2 lb

## 2012-10-30 DIAGNOSIS — K612 Anorectal abscess: Secondary | ICD-10-CM

## 2012-10-30 DIAGNOSIS — K611 Rectal abscess: Secondary | ICD-10-CM | POA: Insufficient documentation

## 2012-10-30 DIAGNOSIS — I10 Essential (primary) hypertension: Secondary | ICD-10-CM | POA: Insufficient documentation

## 2012-10-30 HISTORY — PX: INCISION AND DRAINAGE PERIRECTAL ABSCESS: SHX1804

## 2012-10-30 LAB — CBC WITH DIFFERENTIAL/PLATELET
Basophils Absolute: 0 10*3/uL (ref 0.0–0.1)
HCT: 39 % (ref 36.0–46.0)
Lymphocytes Relative: 12 % (ref 12–46)
Neutro Abs: 14.3 10*3/uL — ABNORMAL HIGH (ref 1.7–7.7)
Platelets: 287 10*3/uL (ref 150–400)
RDW: 12.4 % (ref 11.5–15.5)
WBC: 17.9 10*3/uL — ABNORMAL HIGH (ref 4.0–10.5)

## 2012-10-30 LAB — BASIC METABOLIC PANEL
Calcium: 9 mg/dL (ref 8.4–10.5)
Chloride: 99 mEq/L (ref 96–112)
Creatinine, Ser: 0.63 mg/dL (ref 0.50–1.10)
GFR calc Af Amer: >90 mL/min (ref >90)

## 2012-10-30 SURGERY — INCISION AND DRAINAGE, ABSCESS, PERIRECTAL
Anesthesia: General | Site: Rectum | Wound class: Dirty or Infected

## 2012-10-30 MED ORDER — ALPRAZOLAM 0.25 MG PO TABS: 0.2500 mg | ORAL_TABLET | Freq: Every day | ORAL | Status: DC | PRN

## 2012-10-30 MED ORDER — FLUTICASONE PROPIONATE 50 MCG/ACT NA SUSP
2.0000 | Freq: Every day | NASAL | Status: DC
  Filled 2012-10-30: qty 16

## 2012-10-30 MED ORDER — METOCLOPRAMIDE HCL 5 MG/ML IJ SOLN
INTRAMUSCULAR | Status: DC | PRN
  Administered 2012-10-30: 10 mg via INTRAVENOUS

## 2012-10-30 MED ORDER — LOSARTAN POTASSIUM 50 MG PO TABS
100.0000 mg | ORAL_TABLET | Freq: Every day | ORAL | Status: DC
  Administered 2012-10-31: 100 mg via ORAL
  Filled 2012-10-30 (×2): qty 2

## 2012-10-30 MED ORDER — MIDAZOLAM HCL 5 MG/5ML IJ SOLN
INTRAMUSCULAR | Status: DC | PRN
  Administered 2012-10-30: 2 mg via INTRAVENOUS

## 2012-10-30 MED ORDER — LACTATED RINGERS IV SOLN: INTRAVENOUS | Status: DC

## 2012-10-30 MED ORDER — ONDANSETRON HCL 4 MG/2ML IJ SOLN
INTRAMUSCULAR | Status: DC | PRN
  Administered 2012-10-30: 4 mg via INTRAVENOUS

## 2012-10-30 MED ORDER — ONDANSETRON HCL 4 MG/2ML IJ SOLN
4.0000 mg | Freq: Four times a day (QID) | INTRAMUSCULAR | Status: DC | PRN
  Administered 2012-10-30: 4 mg via INTRAVENOUS
  Filled 2012-10-30: qty 2

## 2012-10-30 MED ORDER — HYDROCODONE-ACETAMINOPHEN 5-325 MG PO TABS
1.0000 | ORAL_TABLET | ORAL | Status: DC | PRN
  Administered 2012-10-31 (×2): 1 via ORAL
  Filled 2012-10-30: qty 1
  Filled 2012-10-30: qty 2

## 2012-10-30 MED ORDER — PANTOPRAZOLE SODIUM 40 MG IV SOLR
40.0000 mg | Freq: Every day | INTRAVENOUS | Status: DC
  Filled 2012-10-30: qty 40

## 2012-10-30 MED ORDER — MORPHINE SULFATE 4 MG/ML IJ SOLN: 4.0000 mg | INTRAMUSCULAR | Status: DC | PRN

## 2012-10-30 MED ORDER — DEXTROSE 5 % IV SOLN
2.0000 g | Freq: Four times a day (QID) | INTRAVENOUS | Status: DC
  Administered 2012-10-30 – 2012-10-31 (×4): 2 g via INTRAVENOUS
  Filled 2012-10-30 (×7): qty 2

## 2012-10-30 MED ORDER — 0.9 % SODIUM CHLORIDE (POUR BTL) OPTIME
TOPICAL | Status: DC | PRN
  Administered 2012-10-30: 1000 mL

## 2012-10-30 MED ORDER — LOSARTAN POTASSIUM-HCTZ 100-25 MG PO TABS: 1.0000 | ORAL_TABLET | Freq: Every day | ORAL | Status: DC

## 2012-10-30 MED ORDER — KCL IN DEXTROSE-NACL 20-5-0.9 MEQ/L-%-% IV SOLN
INTRAVENOUS | Status: DC
  Administered 2012-10-30: 22:00:00 via INTRAVENOUS
  Filled 2012-10-30 (×2): qty 1000

## 2012-10-30 MED ORDER — HYDROCHLOROTHIAZIDE 25 MG PO TABS
25.0000 mg | ORAL_TABLET | Freq: Every day | ORAL | Status: DC
  Administered 2012-10-31: 25 mg via ORAL
  Filled 2012-10-30 (×3): qty 1

## 2012-10-30 MED ORDER — LACTATED RINGERS IV SOLN
INTRAVENOUS | Status: DC | PRN
  Administered 2012-10-30: 19:00:00 via INTRAVENOUS

## 2012-10-30 MED ORDER — FENTANYL CITRATE 0.05 MG/ML IJ SOLN
INTRAMUSCULAR | Status: DC | PRN
  Administered 2012-10-30: 50 ug via INTRAVENOUS
  Administered 2012-10-30 (×2): 25 ug via INTRAVENOUS

## 2012-10-30 MED ORDER — ACETAMINOPHEN 325 MG PO TABS: 650.0000 mg | ORAL_TABLET | ORAL | Status: DC | PRN

## 2012-10-30 MED ORDER — LEVONORGESTREL 20 MCG/24HR IU IUD: 1.0000 | INTRAUTERINE_SYSTEM | Freq: Once | INTRAUTERINE | Status: DC

## 2012-10-30 MED ORDER — HYDROMORPHONE HCL PF 1 MG/ML IJ SOLN
0.2500 mg | INTRAMUSCULAR | Status: DC | PRN
  Administered 2012-10-30: 0.5 mg via INTRAVENOUS

## 2012-10-30 MED ORDER — PROMETHAZINE HCL 25 MG/ML IJ SOLN
6.2500 mg | INTRAMUSCULAR | Status: DC | PRN
Start: 2012-10-30 — End: 2012-10-30

## 2012-10-30 MED ORDER — KCL IN DEXTROSE-NACL 20-5-0.9 MEQ/L-%-% IV SOLN
INTRAVENOUS | Status: DC
  Filled 2012-10-30 (×2): qty 1000

## 2012-10-30 MED ORDER — PROPOFOL 10 MG/ML IV BOLUS
INTRAVENOUS | Status: DC | PRN
  Administered 2012-10-30: 200 mg via INTRAVENOUS

## 2012-10-30 MED ORDER — HYDROMORPHONE HCL PF 1 MG/ML IJ SOLN
1.0000 mg | INTRAMUSCULAR | Status: DC | PRN
  Administered 2012-10-31: 1 mg via INTRAVENOUS
  Filled 2012-10-30: qty 1

## 2012-10-30 SURGICAL SUPPLY — 31 items
BLADE HEX COATED 2.75 (ELECTRODE) ×2 IMPLANT
BLADE SURG 15 STRL LF DISP TIS (BLADE) ×1 IMPLANT
BLADE SURG 15 STRL SS (BLADE) ×2
BRIEF STRETCH FOR OB PAD LRG (UNDERPADS AND DIAPERS) ×1 IMPLANT
CANISTER SUCTION 2500CC (MISCELLANEOUS) ×2 IMPLANT
CLOTH BEACON ORANGE TIMEOUT ST (SAFETY) ×2 IMPLANT
COVER SURGICAL LIGHT HANDLE (MISCELLANEOUS) ×3 IMPLANT
DRSG PAD ABDOMINAL 8X10 ST (GAUZE/BANDAGES/DRESSINGS) ×2 IMPLANT
ELECT REM PT RETURN 9FT ADLT (ELECTROSURGICAL) ×2
ELECTRODE REM PT RTRN 9FT ADLT (ELECTROSURGICAL) ×1 IMPLANT
GAUZE PACKING IODOFORM 1 (PACKING) ×2 IMPLANT
GAUZE SPONGE 4X4 16PLY XRAY LF (GAUZE/BANDAGES/DRESSINGS) ×2 IMPLANT
GLOVE BIOGEL PI IND STRL 7.0 (GLOVE) ×1 IMPLANT
GLOVE BIOGEL PI INDICATOR 7.0 (GLOVE)
GLOVE SURG ORTHO 8.0 STRL STRW (GLOVE) ×2 IMPLANT
GOWN STRL NON-REIN LRG LVL3 (GOWN DISPOSABLE) ×1 IMPLANT
GOWN STRL REIN XL XLG (GOWN DISPOSABLE) ×4 IMPLANT
KIT BASIN OR (CUSTOM PROCEDURE TRAY) ×2 IMPLANT
LUBRICANT JELLY K Y 4OZ (MISCELLANEOUS) ×1 IMPLANT
NDL HYPO 25X1 1.5 SAFETY (NEEDLE) IMPLANT
NEEDLE HYPO 25X1 1.5 SAFETY (NEEDLE) IMPLANT
PACK LITHOTOMY IV (CUSTOM PROCEDURE TRAY) ×2 IMPLANT
PAD ABD 7.5X8 STRL (GAUZE/BANDAGES/DRESSINGS) IMPLANT
PENCIL BUTTON HOLSTER BLD 10FT (ELECTRODE) ×2 IMPLANT
SOL PREP PROV IODINE SCRUB 4OZ (MISCELLANEOUS) ×2 IMPLANT
SPONGE GAUZE 4X4 12PLY (GAUZE/BANDAGES/DRESSINGS) ×2 IMPLANT
SWAB COLLECTION DEVICE MRSA (MISCELLANEOUS) ×1 IMPLANT
SYR CONTROL 10ML LL (SYRINGE) IMPLANT
TOWEL OR 17X26 10 PK STRL BLUE (TOWEL DISPOSABLE) ×2 IMPLANT
UNDERPAD 30X30 INCONTINENT (UNDERPADS AND DIAPERS) ×2 IMPLANT
YANKAUER SUCT BULB TIP 10FT TU (MISCELLANEOUS) ×2 IMPLANT

## 2012-10-30 NOTE — Patient Instructions (Addendum)
Refer to [facility].

## 2012-10-30 NOTE — Progress Notes (Signed)
Subjective:     Patient ID: Stacey Castaneda, female   DOB: [birth year], 42 y.o.   MRN: [id number]  HPI We're asked to see the patient in consultation by Dr. Linford Arnold to evaluate her for a perirectal abscess. The patient is a 42 year old white female who began having perirectal pain on Saturday. The pain has gradually worsened her in a week. She has run fevers to 102. She denies any drainage from the area.  Review of Systems  Constitutional: Positive for fever.  HENT: Negative.   Eyes: Negative.   Respiratory: Negative.   Cardiovascular: Negative.   Gastrointestinal: Positive for rectal pain.  Endocrine: Negative.   Genitourinary: Negative.   Musculoskeletal: Negative.   Skin: Negative.   Allergic/Immunologic: Negative.   Neurological: Negative.   Hematological: Negative.   Psychiatric/Behavioral: Negative.        Objective:   Physical Exam  Constitutional: She is oriented to person, place, and time. She appears well-developed and well-nourished.  HENT:  Head: Normocephalic and atraumatic.  Eyes: Conjunctivae and EOM are normal. Pupils are equal, round, and reactive to light.  Neck: Normal range of motion. Neck supple.  Cardiovascular: Normal rate, regular rhythm and normal heart sounds.   Pulmonary/Chest: Effort normal and breath sounds normal.  Abdominal: Soft. Bowel sounds are normal.  Genitourinary:  The patient has a large palpable fullness measuring approximately 5 cm in diameter deep in the left perirectal space. there is no overlying cellulitis  Musculoskeletal: Normal range of motion.  Neurological: She is alert and oriented to person, place, and time.  Skin: Skin is warm and dry.  Psychiatric: She has a normal mood and affect. Her behavior is normal.       Assessment:     The patient appears to have a large deep perirectal abscess in her left perirectal space. Because of the depth of this I think it would be best drained in the operating room. I have discussed  with her in detail the risks and benefits of the operation to this as well as some of the technical aspects and she understands and wishes to proceed     Plan:     Plan to transfer her to [facility] for incision and drainage of perirectal abscess in the operating room

## 2012-10-30 NOTE — H&P (View-Only) (Signed)
Subjective:     Patient ID: Stacey Castaneda, female   DOB: 04/15/1971, 42 y.o.   MRN: [id number]  HPI We're asked to see the patient in consultation by Dr. Metheney to evaluate her for a perirectal abscess. The patient is a 42-year-old white female who began having perirectal pain on Saturday. The pain has gradually worsened her in a week. She has run fevers to 102. She denies any drainage from the area.  Review of Systems  Constitutional: Positive for fever.  HENT: Negative.   Eyes: Negative.   Respiratory: Negative.   Cardiovascular: Negative.   Gastrointestinal: Positive for rectal pain.  Endocrine: Negative.   Genitourinary: Negative.   Musculoskeletal: Negative.   Skin: Negative.   Allergic/Immunologic: Negative.   Neurological: Negative.   Hematological: Negative.   Psychiatric/Behavioral: Negative.        Objective:   Physical Exam  Constitutional: She is oriented to person, place, and time. She appears well-developed and well-nourished.  HENT:  Head: Normocephalic and atraumatic.  Eyes: Conjunctivae and EOM are normal. Pupils are equal, round, and reactive to light.  Neck: Normal range of motion. Neck supple.  Cardiovascular: Normal rate, regular rhythm and normal heart sounds.   Pulmonary/Chest: Effort normal and breath sounds normal.  Abdominal: Soft. Bowel sounds are normal.  Genitourinary:  The patient has a large palpable fullness measuring approximately 5 cm in diameter deep in the left perirectal space. there is no overlying cellulitis  Musculoskeletal: Normal range of motion.  Neurological: She is alert and oriented to person, place, and time.  Skin: Skin is warm and dry.  Psychiatric: She has a normal mood and affect. Her behavior is normal.       Assessment:     The patient appears to have a large deep perirectal abscess in her left perirectal space. Because of the depth of this I think it would be best drained in the operating room. I have discussed  with her in detail the risks and benefits of the operation to this as well as some of the technical aspects and she understands and wishes to proceed     Plan:     Plan to transfer her to [facility] for incision and drainage of perirectal abscess in the operating room       

## 2012-10-30 NOTE — Progress Notes (Signed)
  Subjective:    Patient ID: Stacey Castaneda, female    DOB: 03-20-1971, 42 y.o.   MRN: [id number]  HPI Husband noticed a bump on the rectal area last night. Has had tenderness for 5 days. Worse with ambulation. Used Preparation H last night and helps some. Worse with sitting for a periods. No blood in the BM.  Thinks has hx of internal hemorrhoid. Has been having chills and fever.  Had goose bumps today.     Review of Systems BP 140/82  Pulse 92  Temp(Src) 101 F (38.3 C) (Oral)  Wt 288 lb (130.636 kg)  BMI 49.41 kg/m2    Allergies  Allergen Reactions  . Lisinopril Cough    Past Medical History  Diagnosis Date  . Anxiety   . Hypertension   . Depression     Past Surgical History  Procedure Laterality Date  . Dilation and curettage of uterus    . Wisdom tooth extraction      History   Social History  . Marital Status: Married    Spouse Name: N/A    Number of Children: N/A  . Years of Education: N/A   Occupational History  . Not on file.   Social History Main Topics  . Smoking status: Former Games developer  . Smokeless tobacco: Never Used  . Alcohol Use: Yes  . Drug Use: No  . Sexually Active:    Other Topics Concern  . Not on file   Social History Narrative  . No narrative on file    Family History  Problem Relation Age of Onset  . Diabetes Father   . Diabetes Mother   . Heart disease Father   . Hypertension Mother   . Hypertension Father     Outpatient Encounter Prescriptions as of 10/30/2012  Medication Sig Dispense Refill  . ALPRAZolam (XANAX) 0.25 MG tablet Take 1 tablet (0.25 mg total) by mouth daily as needed for anxiety. And flying  10 tablet  0  . fluticasone (FLONASE) 50 MCG/ACT nasal spray Place 2 sprays into the nose daily.  16 g  2  . levonorgestrel (MIRENA) 20 MCG/24HR IUD 1 each by Intrauterine route once.        Marland Kitchen losartan-hydrochlorothiazide (HYZAAR) 100-25 MG per tablet Take 1 tablet by mouth daily.  90 tablet  1   No  facility-administered encounter medications on file as of 10/30/2012.          Objective:   Physical Exam  Constitutional: She appears well-developed and well-nourished.  HENT:  Head: Normocephalic and atraumatic.  Genitourinary:  She has an approx 4 cm indurated area at the 3 oclock area. No pustule. Mildly erythematous and very tender. Rectally she is also very tender along the wall of the rectum at the 3:00 position. No actual drainage. No open wounds. No blood in the stool. Normal rectal tone.  Skin: Skin is warm and dry.  Psychiatric: She has a normal mood and affect. Her behavior is normal.          Assessment & Plan:  Perirectal abscess- will call to see if we can get her in acutely this afternoon at [facility] surgery in [place]. I am concerned about a perirectal abscess. Absolutely needs to be drained today she started experiencing fevers.Discussed urgentness of treatment.  I could try to drain in office but I worry that I wouldn't be able to follow her up tomorrow to make sure appropriately drained.

## 2012-10-30 NOTE — Interval H&P Note (Signed)
History and Physical Interval Note:  10/30/2012 7:06 PM  Stacey Castaneda  has presented today for surgery, with the diagnosis of rectal abcess.  The various methods of treatment have been discussed with the patient and family. After consideration of risks, benefits and other options for treatment, the patient has consented to    Procedure(s): peri rectal INCISION AND DRAINAGE ABSCESS (N/A) as a surgical intervention .    The patient's history has been reviewed, patient examined, no change in status, stable for surgery.  I have reviewed the patient's chart and labs.  Questions were answered to the patient's satisfaction.    Velora Heckler, MD, [facility] Office: [phone]    Seibert Keeter Judie Petit

## 2012-10-30 NOTE — Anesthesia Preprocedure Evaluation (Addendum)
Anesthesia Evaluation  Patient identified by MRN, date of birth, ID band Patient awake    Reviewed: Allergy & Precautions, H&P , NPO status , Patient's Chart, lab work & pertinent test results  Airway Mallampati: III TM Distance: >3 FB Neck ROM: Full    Dental  (+) Teeth Intact and Dental Advisory Given   Pulmonary neg pulmonary ROS, former smoker,  breath sounds clear to auscultation  Pulmonary exam normal       Cardiovascular hypertension, negative cardio ROS  Rhythm:Regular Rate:Normal     Neuro/Psych negative neurological ROS  negative psych ROS   GI/Hepatic negative GI ROS, Neg liver ROS,   Endo/Other  negative endocrine ROSMorbid obesity  Renal/GU negative Renal ROS  negative genitourinary   Musculoskeletal negative musculoskeletal ROS (+)   Abdominal (+) + obese,   Peds  Hematology negative hematology ROS (+)   Anesthesia Other Findings   Reproductive/Obstetrics Patient denies risk of pregnancy                          Anesthesia Physical Anesthesia Plan  ASA: II and emergent  Anesthesia Plan: General   Post-op Pain Management:    Induction: Intravenous  Airway Management Planned: Oral ETT  Additional Equipment:   Intra-op Plan:   Post-operative Plan: Extubation in OR  Informed Consent: I have reviewed the patients History and Physical, chart, labs and discussed the procedure including the risks, benefits and alternatives for the proposed anesthesia with the patient or authorized representative who has indicated his/her understanding and acceptance.   Dental advisory given  Plan Discussed with: CRNA  Anesthesia Plan Comments:         Anesthesia Quick Evaluation

## 2012-10-30 NOTE — Brief Op Note (Signed)
10/30/2012  7:45 PM  PATIENT:  Stacey Castaneda  42 y.o. female  PRE-OPERATIVE DIAGNOSIS:  Perirectal abcess  POST-OPERATIVE DIAGNOSIS:  Same   PROCEDURE:  1.  Exam under anesthesia  2. Incision, drainage, and open packing of perirectal abscess  SURGEON:  Surgeon(s) and Role:    * Velora Heckler, MD - Primary  ANESTHESIA:   general  EBL:     BLOOD ADMINISTERED:none  DRAINS: none   LOCAL MEDICATIONS USED:  NONE  SPECIMEN:  No Specimen  DISPOSITION OF SPECIMEN:  N/A  COUNTS:  YES  TOURNIQUET:  * No tourniquets in log *  DICTATION: .Other Dictation: Dictation Number [id number]  PLAN OF CARE: Admit for overnight observation  PATIENT DISPOSITION:  PACU - hemodynamically stable.   Delay start of Pharmacological VTE agent (>24hrs) due to surgical blood loss or risk of bleeding: yes  Velora Heckler, MD, [facility] Office: [phone]

## 2012-10-30 NOTE — Transfer of Care (Signed)
Immediate Anesthesia Transfer of Care Note  Patient: Stacey Castaneda  Procedure(s) Performed: Procedure(s): peri rectal INCISION AND DRAINAGE ABSCESS (N/A)  Patient Location: PACU  Anesthesia Type:General  Level of Consciousness: awake, alert , oriented and patient cooperative  Airway & Oxygen Therapy: Patient Spontanous Breathing and Patient connected to face mask oxygen  Post-op Assessment: Report given to PACU RN and Post -op Vital signs reviewed and stable  Post vital signs: Reviewed and stable  Complications: No apparent anesthesia complications

## 2012-10-31 LAB — SURGICAL PCR SCREEN: MRSA, PCR: NEGATIVE

## 2012-10-31 MED ORDER — HYDROCODONE-ACETAMINOPHEN 5-325 MG PO TABS: 1.0000 | ORAL_TABLET | ORAL | Status: DC | PRN

## 2012-10-31 MED ORDER — AMOXICILLIN-POT CLAVULANATE 875-125 MG PO TABS: 1.0000 | ORAL_TABLET | Freq: Two times a day (BID) | ORAL | Status: DC

## 2012-10-31 NOTE — Progress Notes (Signed)
Taught pt and husband how to prepare a sitz bath and do a wet to dry dressing to her peri-rectal incision site.  Changed pt's dressing and instructed husband how to do the dressing.  Both pt and husband verbalized understanding.

## 2012-10-31 NOTE — Op Note (Signed)
Stacey Castaneda, Stacey Castaneda NO.:  [id number]  MEDICAL RECORD NO.:  [id number]  LOCATION:  1526                         FACILITY:  [facility]  PHYSICIAN:  Velora Heckler, MD      DATE OF BIRTH:  Sep 16, 1970  DATE OF PROCEDURE:  10/30/2012                               OPERATIVE REPORT   PREOPERATIVE DIAGNOSIS:  Perirectal abscess.  POSTOPERATIVE DIAGNOSIS:  Perirectal abscess.  PROCEDURE: 1. Examination under anesthesia. 2. Incision, drainage, and open packing of left perirectal abscess.  SURGEON:  Velora Heckler, MD, FACS  ANESTHESIA:  General per Dr. Lucille Passy.  ESTIMATED BLOOD LOSS:  Minimal.  PREPARATION:  Betadine.  COMPLICATIONS:  None.  INDICATIONS:  The patient is a 42 year old female with a 1-week history of perineal pain.  She developed fevers and chills.  She was seen by her primary care physician and referred to Surgery.  She was evaluated at our office by my partner, Dr. Chevis Pretty, who diagnosed perirectal abscess.  The patient is now brought to the operating room for incision and drainage.  BODY OF REPORT:  Procedure was done in OR #1 at the [facility].  The patient was brought to the operating room, placed in a supine position on the operating room table.  Following administration of general anesthesia, the patient was placed in lithotomy and prepped and draped in the usual aseptic fashion.  After ascertaining that an adequate level of anesthesia have been achieved, digital rectal examination was performed.  There was a palpable inflammatory mass in the left buttock extending into the lower left rectum.  Using the electrocautery, the skin on the left buttock was incised for 2.5 cm.  Dissection was carried through subcutaneous tissues.  Abscess cavity was entered and purulent fluid was evacuated. Cultures were submitted to the laboratory.  The cavity was irrigated copiously with warm saline.  Using blunt digital  dissection, the loculations of the abscess cavity were broken down.  Cavity was again irrigated.  Cavity was packed with 1-inch iodoform gauze packing.  Dry gauze dressings were placed. The patient was taken out of lithotomy and awakened from anesthesia. She was brought to the recovery room in stable condition.  The patient tolerated the procedure well.   Velora Heckler, MD, [facility] Office: [phone]    TMG/MEDQ  D:  10/30/2012  T:  10/31/2012  Job:  [id number]  cc:   Nani Gasser, M.D. Fax: [phone]

## 2012-10-31 NOTE — Discharge Summary (Signed)
Physician Discharge Summary [facility]  Patient ID: Stacey Castaneda MRN: [id number] DOB/AGE: 42/10/1970 42 y.o.  Admit date: 10/30/2012 Discharge date: 10/31/2012  Admission Diagnoses:  Perirectal abscess  Discharge Diagnoses:  Principal Problem:   Perirectal abscess   Discharged Condition: good  Hospital Course: patient admitted for IV abx and observation after I&D of perirectal abscess.  Post op course uncomplicated.  Packing removed this AM.  Prepared for discharge home.  Consults: None  Significant Diagnostic Studies: none  Treatments: surgery: incision and drainage of peri-rectal abscess  Discharge Exam: Blood pressure 105/57, pulse 79, temperature 97.5 F (36.4 C), temperature source Oral, resp. rate 18, height 5\' 4"  (1.626 m), weight 288 lb 3.2 oz (130.727 kg), SpO2 99.00%. HEENT - clear Chest - clear Perineum - dressings dry and intact  Disposition: Home with family  Discharge Orders   Future Appointments Provider Department Dept Phone   04/14/2013 8:30 AM Agapito Games, MD [facility] [place] [phone]   Future Orders Complete By Expires     Diet - low sodium heart healthy  As directed     Discharge instructions  As directed     Comments:      [facility] -- DISCHARGE INSTRUCTIONS  REMINDER:   Carry a list of your medications and allergies with you at all times  Call your pharmacy at least 1 week in advance to refill prescriptions  Do not mix any prescribed pain medicine with alcohol  Do not drive any motor vehicles while taking pain medication  Take medications with food unless otherwise directed  Follow-up appointments (date to return to physician): Please call [phone] to confirm your follow up appointment with your surgeon.  Call your Surgeon if you have:  Temperature greater than 101.0  Persistent nausea and vomiting  Severe uncontrolled pain  Redness, tenderness,  or signs of infection (pain, swelling, redness, odor or    green/yellow discharge around the site)  Difficulty breathing, headache or visual disturbances  Hives  Persistent dizziness or light-headedness  Any other questions or concerns you may have after discharge  In an emergency, call 911 or go to an Emergency Department at a nearby hospital.   Diet: Begin with liquids, and if they are tolerated, resume your usual diet.  Avoid spicy, greasy or heavy foods.  If you have nausea or vomiting, go back to liquids.  If you cannot keep liquids down, call your doctor.  Avoid alcohol consumption while on prescription pain medications. Good nutrition promotes healing. Increase fiber and fluids.   ADDITIONAL INSTRUCTIONS: Change dressing once a day and as needed.  Tub soaks once daily and as needed.  Cleanse area with baby wipes after bowel movements.  Velora Heckler, MD, [facility] Office: [phone]    Increase activity slowly  As directed         Medication List         ALPRAZolam 0.25 MG tablet  Commonly known as:  XANAX  Take 1 tablet (0.25 mg total) by mouth daily as needed for anxiety. And flying     amoxicillin-clavulanate 875-125 MG per tablet  Commonly known as:  AUGMENTIN  Take 1 tablet by mouth 2 (two) times daily.     cetirizine 10 MG tablet  Commonly known as:  ZYRTEC  Take 10 mg by mouth daily.     fluticasone 50 MCG/ACT nasal spray  Commonly known as:  FLONASE  Place 2 sprays into  the nose daily.     HYDROcodone-acetaminophen 5-325 MG per tablet  Commonly known as:  NORCO/VICODIN  Take 1-2 tablets by mouth every 4 (four) hours as needed for pain.     ibuprofen 100 MG tablet  Commonly known as:  ADVIL,MOTRIN  Take 200 mg by mouth every 6 (six) hours as needed for fever (pain).     levonorgestrel 20 MCG/24HR IUD  Commonly known as:  MIRENA  1 each by Intrauterine route once.     losartan-hydrochlorothiazide 100-25 MG per tablet   Commonly known as:  HYZAAR  Take 1 tablet by mouth daily.     sodium chloride 0.65 % nasal spray  Commonly known as:  OCEAN  Place 1 spray into the nose as needed for congestion (congrestion).           Follow-up Information   Follow up with [facility],MD, MD. Schedule an appointment as soon as possible for a visit in 1 week.   Contact information:   [address] [place] [address] [phone]       Velora Heckler, MD, [facility] Office: [phone]   Signed: Velora Heckler 10/31/2012, 10:02 AM

## 2012-11-01 NOTE — Anesthesia Postprocedure Evaluation (Signed)
Anesthesia Post Note  Patient: Stacey Castaneda  Procedure(s) Performed: Procedure(s) (LRB): peri rectal INCISION AND DRAINAGE ABSCESS (N/A)  Anesthesia type: General  Patient location: PACU  Post pain: Pain level controlled  Post assessment: Post-op Vital signs reviewed  Last Vitals:  Filed Vitals:   10/31/12 1316  BP: 133/75  Pulse: 74  Temp: 36.4 C  Resp: 18    Post vital signs: Reviewed  Level of consciousness: sedated  Complications: No apparent anesthesia complications

## 2012-11-02 ENCOUNTER — Encounter (HOSPITAL_COMMUNITY): Payer: Self-pay | Admitting: Surgery

## 2012-11-02 ENCOUNTER — Telehealth (INDEPENDENT_AMBULATORY_CARE_PROVIDER_SITE_OTHER): Payer: Self-pay

## 2012-11-02 LAB — CULTURE, ROUTINE-ABSCESS

## 2012-11-02 NOTE — Telephone Encounter (Signed)
Went over wound packing instructions with patient and her husband.  Answered all questions.  Pt will call us back when she decides when to return to work.

## 2012-11-03 ENCOUNTER — Telehealth (INDEPENDENT_AMBULATORY_CARE_PROVIDER_SITE_OTHER): Payer: Self-pay

## 2012-11-03 NOTE — Telephone Encounter (Signed)
Pt needs to clarify exactly what terms she will return to work under.  She will discuss with her employer and contact us for a RTW letter.

## 2012-11-10 ENCOUNTER — Ambulatory Visit (INDEPENDENT_AMBULATORY_CARE_PROVIDER_SITE_OTHER): Payer: BC Managed Care – PPO | Admitting: Internal Medicine

## 2012-11-10 ENCOUNTER — Encounter (INDEPENDENT_AMBULATORY_CARE_PROVIDER_SITE_OTHER): Payer: Self-pay | Admitting: Internal Medicine

## 2012-11-10 ENCOUNTER — Encounter (INDEPENDENT_AMBULATORY_CARE_PROVIDER_SITE_OTHER): Payer: Self-pay | Admitting: General Surgery

## 2012-11-10 VITALS — BP 128/70 | HR 90 | Temp 98.4°F | Resp 18 | Ht 64.0 in | Wt 286.0 lb

## 2012-11-10 DIAGNOSIS — K612 Anorectal abscess: Secondary | ICD-10-CM

## 2012-11-10 DIAGNOSIS — K611 Rectal abscess: Secondary | ICD-10-CM

## 2012-11-10 NOTE — Progress Notes (Signed)
  Subjective: Pt returns to the clinic today after being hospitalized for left perirectal abscess.  The patient underwent incision and drainage of left perirectal abscess on 10/31/12 by Dr. Gerrit Friends.  Returns today for wound check Overall she is doing very well, doing dressing changes twice.  Objective: Vital signs in last 24 hours: Reviewed  PE: Heart: RRR Lungs: CTA bil Abd: soft Skin: left perirectal abscess wound looks good with good granulation, no surrounding induration or drainage  Lab Results:  No results found for this basename: WBC, HGB, HCT, PLT,  in the last 72 hours BMET No results found for this basename: NA, K, CL, CO2, GLUCOSE, BUN, CREATININE, CALCIUM,  in the last 72 hours PT/INR No results found for this basename: LABPROT, INR,  in the last 72 hours CMP     Component Value Date/Time   NA 135 10/30/2012 1820   K 3.1* 10/30/2012 1820   CL 99 10/30/2012 1820   CO2 25 10/30/2012 1820   GLUCOSE 99 10/30/2012 1820   BUN 7 10/30/2012 1820   CREATININE 0.63 10/30/2012 1820   CREATININE 0.56 02/20/2012 0805   CALCIUM 9.0 10/30/2012 1820   PROT 7.0 09/12/2011 0920   ALBUMIN 4.1 09/12/2011 0920   AST 29 09/12/2011 0920   ALT 30 09/12/2011 0920   ALKPHOS 74 09/12/2011 0920   BILITOT 0.4 09/12/2011 0920   GFRNONAA >90 10/30/2012 1820   GFRAA >90 10/30/2012 1820   Lipase  No results found for this basename: lipase       Studies/Results: No results found.  Anti-infectives: Anti-infectives   None       Assessment/Plan  1.  S/P Incisions and drainage of left perirectal abscess: will continue dressing changes twice a day with the one sitz bath from 5-7 more days, then discontinue dressing changes and do sitz bathes three times a day.  Should also try to do sitz bath after every bowel movement. Recheck in 2 weeks.     WHITE, ELIZABETH 11/10/2012

## 2012-11-10 NOTE — Patient Instructions (Addendum)
Continue dressing changes twice a day with the one sitz bath from 5-7 more days, then discontinue dressing changes and do sitz bathes three times a day.   Should also try to do sitz bath after every bowel movement.  Recheck in 2 weeks.  May start part time at up to 20 hours per week starting 11/11/12 Then up to full time after 11/18/12

## 2012-11-16 ENCOUNTER — Encounter (INDEPENDENT_AMBULATORY_CARE_PROVIDER_SITE_OTHER): Payer: Private Health Insurance - Indemnity | Admitting: Surgery

## 2012-11-24 ENCOUNTER — Ambulatory Visit (INDEPENDENT_AMBULATORY_CARE_PROVIDER_SITE_OTHER): Payer: BC Managed Care – PPO | Admitting: General Surgery

## 2012-11-24 ENCOUNTER — Encounter (INDEPENDENT_AMBULATORY_CARE_PROVIDER_SITE_OTHER): Payer: Self-pay | Admitting: General Surgery

## 2012-11-24 VITALS — BP 130/90 | HR 68 | Temp 98.0°F | Resp 15 | Ht 64.0 in | Wt 285.2 lb

## 2012-11-24 DIAGNOSIS — K611 Rectal abscess: Secondary | ICD-10-CM

## 2012-11-24 DIAGNOSIS — K612 Anorectal abscess: Secondary | ICD-10-CM

## 2012-11-24 NOTE — Progress Notes (Signed)
Subjective:     Patient ID: Stacey Castaneda, female   DOB: 1970/06/03, 42 y.o.   MRN: [id number]  HPI Pt here for a wound check. She was hospitalized for a perrectal abscess and underwent I&D by Dr. Gerrit Friends 10/31/12.  She denies any drainage. No longer requiring packing.  She has questions regarding ongoing stool softener use.  She denies changes in bowel pattern, melena, hematochezia or weight loss.   Review of Systems  Constitutional: Negative for fever, chills and unexpected weight change.  Gastrointestinal: Positive for constipation. Negative for nausea, abdominal pain, blood in stool and rectal pain.  Skin: Positive for wound. Negative for color change, pallor and rash.       Objective:   Physical Exam  Constitutional: She appears well-developed and well-nourished.  Abdominal: Soft. Bowel sounds are normal.  Skin: No rash noted. No erythema. No pallor.  Well healed wound to the perirectum       Assessment:     Perirectal abscess S/p I&D Dr. Gerrit Friends 10/31/12    Plan:     Resolved.  She may follow up as needed.  She may continue to take colace.  I provided her with bowel regimen she can follow.  We discussed signs and symptoms that wound require additional work up including weight loss, change in bowel pattern, hematochezia.

## 2012-11-24 NOTE — Patient Instructions (Signed)
Your wound looks great and is healed.  GETTING TO GOOD BOWEL HEALTH. Irregular bowel habits such as constipation and diarrhea can lead to many problems over time.  Having one soft bowel movement a day is the most important way to prevent further problems.  The anorectal canal is designed to handle stretching and feces to safely manage our ability to get rid of solid waste (feces, poop, stool) out of our body.  BUT, hard constipated stools can act like ripping concrete bricks and diarrhea can be a burning fire to this very sensitive area of our body, causing inflamed hemorrhoids, anal fissures, increasing risk is perirectal abscesses, abdominal pain/bloating, an making irritable bowel worse.     The goal: ONE SOFT BOWEL MOVEMENT A DAY!  To have soft, regular bowel movements:    Drink at least 8 tall glasses of water a day.     Take plenty of fiber.  Fiber is the undigested part of plant food that passes into the colon, acting s "natures broom" to encourage bowel motility and movement.  Fiber can absorb and hold large amounts of water. This results in a larger, bulkier stool, which is soft and easier to pass. Work gradually over several weeks up to 6 servings a day of fiber (25g a day even more if needed) in the form of: o Vegetables -- Root (potatoes, carrots, turnips), leafy green (lettuce, salad greens, celery, spinach), or cooked high residue (cabbage, broccoli, etc) o Fruit -- Fresh (unpeeled skin & pulp), Dried (prunes, apricots, cherries, etc ),  or stewed ( applesauce)  o Whole grain breads, pasta, etc (whole wheat)  o Bran cereals    Bulking Agents -- This type of water-retaining fiber generally is easily obtained each day by one of the following:  o Psyllium bran -- The psyllium plant is remarkable because its ground seeds can retain so much water. This product is available as Metamucil, Konsyl, Effersyllium, Per Diem Fiber, or the less expensive generic preparation in drug and health food  stores. Although labeled a laxative, it really is not a laxative.  o Methylcellulose -- This is another fiber derived from wood which also retains water. It is available as Citrucel. o Polyethylene Glycol - and "artificial" fiber commonly called Miralax or Glycolax.  It is helpful for people with gassy or bloated feelings with regular fiber o Flax Seed - a less gassy fiber than psyllium   No reading or other relaxing activity while on the toilet. If bowel movements take longer than 5 minutes, you are too constipated   AVOID CONSTIPATION.  High fiber and water intake usually takes care of this.  Sometimes a laxative is needed to stimulate more frequent bowel movements, but    Laxatives are not a good long-term solution as it can wear the colon out. o Osmotics (Milk of Magnesia, Fleets phosphosoda, Magnesium citrate, MiraLax, GoLytely) are safer than  o Stimulants (Senokot, Castor Oil, Dulcolax, Ex Lax)    o Do not take laxatives for more than 7days in a row.    IF SEVERELY CONSTIPATED, try a Bowel Retraining Program: o Do not use laxatives.  o Eat a diet high in roughage, such as bran cereals and leafy vegetables.  o Drink six (6) ounces of prune or apricot juice each morning.  o Eat two (2) large servings of stewed fruit each day.  o Take one (1) heaping tablespoon of a psyllium-based bulking agent twice a day. Use sugar-free sweetener when possible to avoid excessive calories.  o Eat a normal breakfast.  o Set aside 15 minutes after breakfast to sit on the toilet, but do not strain to have a bowel movement.  o If you do not have a bowel movement by the third day, use an enema and repeat the above steps.    Controlling diarrhea o Switch to liquids and simpler foods for a few days to avoid stressing your intestines further. o Avoid dairy products (especially milk & ice cream) for a short time.  The intestines often can lose the ability to digest lactose when stressed. o Avoid foods that cause  gassiness or bloating.  Typical foods include beans and other legumes, cabbage, broccoli, and dairy foods.  Every person has some sensitivity to other foods, so listen to our body and avoid those foods that trigger problems for you. o Adding fiber (Citrucel, Metamucil, psyllium, Miralax) gradually can help thicken stools by absorbing excess fluid and retrain the intestines to act more normally.  Slowly increase the dose over a few weeks.  Too much fiber too soon can backfire and cause cramping & bloating. o Probiotics (such as active yogurt, Align, etc) may help repopulate the intestines and colon with normal bacteria and calm down a sensitive digestive tract.  Most studies show it to be of mild help, though, and such products can be costly. o Medicines:   Bismuth subsalicylate (ex. Kayopectate, Pepto Bismol) every 30 minutes for up to 6 doses can help control diarrhea.  Avoid if pregnant.   Loperamide (Immodium) can slow down diarrhea.  Start with two tablets (4mg  total) first and then try one tablet every 6 hours.  Avoid if you are having fevers or severe pain.  If you are not better or start feeling worse, stop all medicines and call your doctor for advice o Call your doctor if you are getting worse or not better.  Sometimes further testing (cultures, endoscopy, X-ray studies, bloodwork, etc) may be needed to help diagnose and treat the cause of the diarrhe o

## 2012-12-29 ENCOUNTER — Other Ambulatory Visit: Payer: Self-pay | Admitting: Family Medicine

## 2012-12-29 ENCOUNTER — Encounter: Payer: Self-pay | Admitting: Obstetrics & Gynecology

## 2012-12-29 ENCOUNTER — Ambulatory Visit (INDEPENDENT_AMBULATORY_CARE_PROVIDER_SITE_OTHER): Payer: BC Managed Care – PPO | Admitting: Obstetrics & Gynecology

## 2012-12-29 VITALS — BP 130/82 | HR 72 | Resp 16 | Ht 64.0 in | Wt 287.0 lb

## 2012-12-29 DIAGNOSIS — L293 Anogenital pruritus, unspecified: Secondary | ICD-10-CM

## 2012-12-29 DIAGNOSIS — Z1151 Encounter for screening for human papillomavirus (HPV): Secondary | ICD-10-CM

## 2012-12-29 DIAGNOSIS — N898 Other specified noninflammatory disorders of vagina: Secondary | ICD-10-CM

## 2012-12-29 DIAGNOSIS — Z01419 Encounter for gynecological examination (general) (routine) without abnormal findings: Secondary | ICD-10-CM

## 2012-12-29 DIAGNOSIS — Z124 Encounter for screening for malignant neoplasm of cervix: Secondary | ICD-10-CM

## 2012-12-29 DIAGNOSIS — I10 Essential (primary) hypertension: Secondary | ICD-10-CM

## 2012-12-29 DIAGNOSIS — E785 Hyperlipidemia, unspecified: Secondary | ICD-10-CM

## 2012-12-29 NOTE — Addendum Note (Signed)
Addended by: Granville Lewis on: 12/29/2012 04:26 PM   Modules accepted: Orders

## 2012-12-29 NOTE — Progress Notes (Signed)
  Subjective:     Stacey Castaneda is a 42 y.o. female here for a routine exam.  Current complaints: none.  Pt has IUD in situ and is not having menses.  Personal health questionnaire reviewed: yes.  Pt complaining of vaginal discharge.   Gynecologic History No LMP recorded. Patient is not currently having periods (Reason: IUD). Contraception: IUD Last Pap: 2012. Results were: normal Last mammogram: 2012. Results were: normal  Obstetric History OB History  Gravida Para Term Preterm AB SAB TAB Ectopic Multiple Living  0                  The following portions of the patient's history were reviewed and updated as appropriate: allergies, current medications, past family history, past medical history, past social history, past surgical history and problem list.  Review of Systems Pertinent items noted in HPI   Objective:     Vitals:  WNL General appearance: alert, cooperative and no distress Head: Normocephalic, without obvious abnormality, atraumatic Eyes: negative Throat: lips, mucosa, and tongue normal; teeth and gums normal Lungs: clear to auscultation bilaterally Breasts: normal appearance, no masses or tenderness, No nipple retraction or dimpling, No nipple discharge or bleeding Heart: regular rate and rhythm Abdomen: soft, non-tender; bowel sounds normal; no masses,  no organomegaly Pelvic: cervix normal in appearance, external genitalia normal, no adnexal masses or tenderness, no bladder tenderness, no cervical motion tenderness, perianal skin: no external genital warts noted, rectovaginal septum normal, urethra without abnormality or discharge, uterus and adnexa are difficult to palpate due to habitus, and vagina normal without discharge Extremities: no edema, redness or tenderness in the calves or thighs Skin: no lesions or rash Lymph nodes: Axillary adenopathy: none        Assessment:    Healthy female exam.    Plan:    Education reviewed: self breast  exams. Contraception: IUD. Mammogram ordered. Follow up in: 1 year. pap with HPV BD Affirm

## 2012-12-31 ENCOUNTER — Telehealth: Payer: Self-pay | Admitting: *Deleted

## 2012-12-31 NOTE — Telephone Encounter (Signed)
Pt notified of neg wet prep. 

## 2013-03-01 ENCOUNTER — Encounter: Payer: Self-pay | Admitting: Physician Assistant

## 2013-03-01 ENCOUNTER — Ambulatory Visit (INDEPENDENT_AMBULATORY_CARE_PROVIDER_SITE_OTHER): Payer: BC Managed Care – PPO | Admitting: Physician Assistant

## 2013-03-01 VITALS — BP 129/63 | HR 85 | Wt 291.0 lb

## 2013-03-01 DIAGNOSIS — K612 Anorectal abscess: Secondary | ICD-10-CM

## 2013-03-01 DIAGNOSIS — K611 Rectal abscess: Secondary | ICD-10-CM

## 2013-03-01 MED ORDER — FLUCONAZOLE 150 MG PO TABS: 150.0000 mg | ORAL_TABLET | Freq: Once | ORAL | Status: DC

## 2013-03-01 MED ORDER — AMOXICILLIN-POT CLAVULANATE 875-125 MG PO TABS: 1.0000 | ORAL_TABLET | Freq: Two times a day (BID) | ORAL | Status: DC

## 2013-03-01 NOTE — Patient Instructions (Signed)
Peri-Rectal Abscess °Your caregiver has diagnosed you as having a peri-rectal abscess. This is an infected area near the rectum that is filled with pus. If the abscess is near the surface of the skin, your caregiver may open (incise) the area and drain the pus. °HOME CARE INSTRUCTIONS  °· If your abscess was opened up and drained. A small piece of gauze may be placed in the opening so that it can drain. Do not remove the gauze unless directed by your caregiver. °· A loose dressing may be placed over the abscess site. Change the dressing as often as necessary to keep it clean and dry. °· After the drain is removed, the area may be washed with a gentle antiseptic (soap) four times per day. °· A warm sitz bath, warm packs or heating pad may be used for pain relief, taking care not to burn yourself. °· Return for a wound check in 1 day or as directed. °· An "inflatable doughnut" may be used for sitting with added comfort. These can be purchased at a drugstore or medical supply house. °· To reduce pain and straining with bowel movements, eat a high fiber diet with plenty of fruits and vegetables. Use stool softeners as recommended by your caregiver. This is especially important if narcotic type pain medications were prescribed as these may cause marked constipation. °· Only take over-the-counter or prescription medicines for pain, discomfort, or fever as directed by your caregiver. °SEEK IMMEDIATE MEDICAL CARE IF:  °· You have increasing pain that is not controlled by medication. °· There is increased inflammation (redness), swelling, bleeding, or drainage from the area. °· An oral temperature above 102° F (38.9° C) develops. °· You develop chills or generalized malaise (feel lethargic or feel "washed out"). °· You develop any new symptoms (problems) you feel may be related to your present problem. °Document Released: 04/19/2000 Document Revised: 07/15/2011 Document Reviewed: 04/19/2008 °[employer]® Patient Information  ©2014 [employer]. ° °

## 2013-03-01 NOTE — Progress Notes (Signed)
  Subjective:    Patient ID: Stacey Castaneda, female    DOB: 1971-01-05, 42 y.o.   MRN: [id number]  HPI Patient presents to the clinic for follow up on perirectal abcess. She had surgery done in June 2014. She tolerated procedure well and all f/u appointments were good. In the last week she has started to have some discomfort around the area of incision. Her husband has been periodically checking incision site and not noticed anything until Sunday. He noticed a small blackish nodule coming off incision. Today she had a bowel movement and saw some blood in toilet and in underwear. Denies any fever, chills.continues to have minimal discomfort.      Review of Systems     Objective:   Physical Exam  Genitourinary:             Assessment & Plan:  Perirectal abscess- rectal exam normal. I do not think abscess is forming again but does appear that hematomacyst ruptured and caused the bleeding. There was some area of warmth and erythema with active draining. I did go ahead and treat with augmentin to prevent in cellulitis from forming. Discussed symptomatic care( warm sitz baths, keeping clean and dry). Diflucan was given b/c pt gets yeast infections with abx usage. Call if devleop fever, chills, another nodule, increase in pain. Follow up in 1 week to make sure healing appropriately.

## 2013-03-04 ENCOUNTER — Telehealth: Payer: Self-pay | Admitting: *Deleted

## 2013-03-04 DIAGNOSIS — Z0000 Encounter for general adult medical examination without abnormal findings: Secondary | ICD-10-CM

## 2013-03-04 NOTE — Telephone Encounter (Signed)
Appt made for screening mammogram.

## 2013-03-11 ENCOUNTER — Other Ambulatory Visit: Payer: Self-pay

## 2013-03-12 ENCOUNTER — Telehealth: Payer: Self-pay | Admitting: *Deleted

## 2013-03-12 NOTE — Telephone Encounter (Signed)
Pt left message stating that the abx Lesly Rubenstein gave her helped but now she has another "eruption" & would like another round.  She had surgery for a perirectal abscess back in June.

## 2013-03-14 NOTE — Telephone Encounter (Signed)
Needs appt. She was supposed to f/u around 11/3 to have wound rechecked.

## 2013-03-15 NOTE — Telephone Encounter (Signed)
Spoke with pt this morning & she states that over the wknd the wound was draining some.  She stated that the coloring around the wound looks good.  I transferred her to scheduling in case you need to see her again since you wanted to f/u with her.

## 2013-03-16 ENCOUNTER — Ambulatory Visit (INDEPENDENT_AMBULATORY_CARE_PROVIDER_SITE_OTHER): Payer: BC Managed Care – PPO

## 2013-03-16 ENCOUNTER — Encounter: Payer: Self-pay | Admitting: Family Medicine

## 2013-03-16 ENCOUNTER — Ambulatory Visit (INDEPENDENT_AMBULATORY_CARE_PROVIDER_SITE_OTHER): Payer: BC Managed Care – PPO | Admitting: Family Medicine

## 2013-03-16 VITALS — BP 132/89 | HR 97 | Temp 98.5°F | Wt 292.0 lb

## 2013-03-16 DIAGNOSIS — K611 Rectal abscess: Secondary | ICD-10-CM

## 2013-03-16 DIAGNOSIS — Z1231 Encounter for screening mammogram for malignant neoplasm of breast: Secondary | ICD-10-CM

## 2013-03-16 DIAGNOSIS — Z23 Encounter for immunization: Secondary | ICD-10-CM

## 2013-03-16 DIAGNOSIS — Z0000 Encounter for general adult medical examination without abnormal findings: Secondary | ICD-10-CM

## 2013-03-16 DIAGNOSIS — K612 Anorectal abscess: Secondary | ICD-10-CM

## 2013-03-16 MED ORDER — FLUCONAZOLE 150 MG PO TABS: 150.0000 mg | ORAL_TABLET | Freq: Once | ORAL | Status: DC

## 2013-03-16 MED ORDER — AMOXICILLIN-POT CLAVULANATE 875-125 MG PO TABS: 1.0000 | ORAL_TABLET | Freq: Two times a day (BID) | ORAL | Status: DC

## 2013-03-16 NOTE — Patient Instructions (Signed)
If the area drains again then we need to refer you to the surgeon or if tenderness is not resolving then we need to refer.

## 2013-03-16 NOTE — Progress Notes (Signed)
  Subjective:    Patient ID: Stacey Castaneda, female    DOB: 1970-08-31, 42 y.o.   MRN: [id number]  HPI Thomas Hoff on 03/01/13 for perirectal lesion. Completed ABX on 11/5 (wed and then last thur night (03/11/13)  felt more sore and had her husband check it and he was able to express some stuff from it.  Has not drained since then.  Still feels tender today.  Not sure if fever. No chills or sweats. Has felt more tired.  History of perirectal abscess that required surgery in June.    Review of Systems     Objective:   Physical Exam  Constitutional: She appears well-developed and well-nourished.  Genitourinary:     Skin: Skin is warm and dry.  Psychiatric: She has a normal mood and affect. Her behavior is normal.          Assessment & Plan:  Perirectal abscess-today I do not see any opening or drainage and the wound. Don't feel any induration or nodules underneath the skin. There's a little bit of excoriation over the scar but nothing else worrisome. Overall it actually looks pretty good. I did go ahead and refill the antibiotics and she still a little bit tender just to make sure that we have completely resolved infection. If it does not completely resolve or she notices any drainage again that she's to call the office and we'll refer her back to general surgery for further evaluation. There still could be a track that his left from the previous surgery.

## 2013-04-14 ENCOUNTER — Ambulatory Visit (INDEPENDENT_AMBULATORY_CARE_PROVIDER_SITE_OTHER): Payer: BC Managed Care – PPO | Admitting: Family Medicine

## 2013-04-14 ENCOUNTER — Encounter: Payer: Self-pay | Admitting: Family Medicine

## 2013-04-14 VITALS — BP 118/88 | HR 92 | Resp 17 | Wt 292.0 lb

## 2013-04-14 DIAGNOSIS — I10 Essential (primary) hypertension: Secondary | ICD-10-CM

## 2013-04-14 DIAGNOSIS — R7309 Other abnormal glucose: Secondary | ICD-10-CM

## 2013-04-14 DIAGNOSIS — R5381 Other malaise: Secondary | ICD-10-CM

## 2013-04-14 MED ORDER — LOSARTAN POTASSIUM-HCTZ 100-25 MG PO TABS: 1.0000 | ORAL_TABLET | Freq: Every day | ORAL | Status: DC

## 2013-04-14 NOTE — Progress Notes (Signed)
   Subjective:    Patient ID: Stacey Castaneda, female    DOB: September 02, 1970, 42 y.o.   MRN: [id number]  HPI HTN - Pt denies chest pain, SOB, dizziness, or heart palpitations.  Taking meds as directed w/o problems.  Denies medication side effects. She has felt more fatigued lately but is not sure why. She thinks it may just be a matter of her obesity and age eating. It's nothing specific that she can pinpoint. She does feel like she's felt more cold in the last few months. She's been flaring her close to stay warm which is unusual for her.  Obesity/BMI >40 - She is still struggling with her weight. Would like a nutrition referral. She has been working on her diet but no consistent exercise lately.  Review of Systems     Objective:   Physical Exam  Constitutional: She is oriented to person, place, and time. She appears well-developed and well-nourished.  HENT:  Head: Normocephalic and atraumatic.  Neck: Neck supple. No thyromegaly present.  Cardiovascular: Normal rate, regular rhythm and normal heart sounds.   No carotid bruits.   Pulmonary/Chest: Effort normal and breath sounds normal.  Lymphadenopathy:    She has no cervical adenopathy.  Neurological: She is alert and oriented to person, place, and time.  Skin: Skin is warm and dry.  Psychiatric: She has a normal mood and affect. Her behavior is normal.          Assessment & Plan:  HTN -  Well controlled. Refill meds. F/u in 6 mo. continue current regimen.  Obesity/BMI >40 -I. think a nutrition referral would be absolutely fantastic. Encouraged to keep a food diary and take it with her for the appointment. Will refer to nutrion therapy.  \  Fatigue-we'll check for anemia and thyroid disorder.

## 2013-04-27 LAB — CBC
HCT: 38.3 % (ref 36.0–46.0)
Hemoglobin: 13.1 g/dL (ref 12.0–15.0)
MCV: 85.1 fL (ref 78.0–100.0)
RBC: 4.5 MIL/uL (ref 3.87–5.11)
WBC: 7.8 10*3/uL (ref 4.0–10.5)

## 2013-04-27 LAB — FOLATE: Folate: 19 ng/mL

## 2013-04-27 LAB — LIPID PANEL
HDL: 45 mg/dL (ref >39)
LDL Cholesterol: 90 mg/dL (ref 0–99)
Triglycerides: 107 mg/dL (ref <150)

## 2013-04-27 LAB — COMPLETE METABOLIC PANEL WITH GFR
ALT: 25 U/L (ref 0–35)
CO2: 23 mEq/L (ref 19–32)
Creat: 0.55 mg/dL (ref 0.50–1.10)
GFR, Est African American: >89 mL/min
GFR, Est Non African American: >89 mL/min
Total Bilirubin: 0.5 mg/dL (ref 0.3–1.2)

## 2013-04-30 NOTE — Progress Notes (Signed)
Quick Note:  All labs are normal. ______ 

## 2013-06-01 ENCOUNTER — Encounter: Payer: Self-pay | Admitting: Family Medicine

## 2013-06-01 ENCOUNTER — Ambulatory Visit (INDEPENDENT_AMBULATORY_CARE_PROVIDER_SITE_OTHER): Payer: BC Managed Care – PPO | Admitting: Family Medicine

## 2013-06-01 VITALS — BP 117/67 | HR 96 | Temp 98.0°F | Ht 64.0 in | Wt 292.0 lb

## 2013-06-01 DIAGNOSIS — L0291 Cutaneous abscess, unspecified: Secondary | ICD-10-CM

## 2013-06-01 DIAGNOSIS — L0390 Cellulitis, unspecified: Secondary | ICD-10-CM

## 2013-06-01 MED ORDER — SULFAMETHOXAZOLE-TMP DS 800-160 MG PO TABS: 1.0000 | ORAL_TABLET | Freq: Two times a day (BID) | ORAL | Status: DC

## 2013-06-01 MED ORDER — FLUCONAZOLE 150 MG PO TABS: 150.0000 mg | ORAL_TABLET | Freq: Once | ORAL | Status: DC

## 2013-06-01 NOTE — Addendum Note (Signed)
Addended by: Beatrice Lecher D on: 06/01/2013 11:55 AM   Modules accepted: Orders

## 2013-06-01 NOTE — Progress Notes (Signed)
   Subjective:    Patient ID: Stacey Castaneda, female    DOB: 1970-07-31, 43 y.o.   MRN: [id number]  HPI History of perirectal abscess that was treated surgically back in July 2014. She'll come back in in November with some signs of early perirectal abscess. After 2 rounds of antibiotics her symptoms completely cleared there she was on Augmentin. She's not had any symptoms until a few days ago. She started to notice some soreness but did not notice any changes in the skin. No erythema. No open wounds or drainage. The last couple of days she feels like she feels a little palpable nodule underneath the skin. Season she has been taking his stool softer daily to keep her bowels moving normally. She has not had any treatments. She's been to make sure that she got treatment early if this is a recurrent infection.   Review of Systems     Objective:   Physical Exam  Constitutional: She appears well-developed and well-nourished. No distress.  Genitourinary:     Old scar is well-healed. No tears or irritation. No erythema. I do feel a bit of firm tissue at the distal end of the scar. Unclear if consistent with an early abscess versus scar tissue. It is tender to palpation. She does have external hemorrhoids but they are not inflamed today.  Skin: Skin is warm and dry.  Psychiatric: She has a normal mood and affect. Her behavior is normal.          Assessment & Plan:  Abscess-may be an early perirectal abscess right below the old incision. Consider this could also be a tract that never completely healed from the original surgery. On palpation is difficult to say if there is an actual nodule versus scar tissue. To be on the safe side we'll go ahead and place her on Bactrim for 14 days. If her symptoms do not completely resolve then she will need referral back to general surgery for further evaluation. Recommend sitz baths and keeping the area clean and addition to keeping stools moving regularly.

## 2013-07-03 ENCOUNTER — Other Ambulatory Visit: Payer: Self-pay | Admitting: Family Medicine

## 2013-07-07 ENCOUNTER — Encounter: Payer: Self-pay | Admitting: Family Medicine

## 2013-07-07 ENCOUNTER — Ambulatory Visit (INDEPENDENT_AMBULATORY_CARE_PROVIDER_SITE_OTHER): Payer: BC Managed Care – PPO | Admitting: Family Medicine

## 2013-07-07 VITALS — BP 128/70 | HR 87 | Temp 98.2°F | Wt 288.0 lb

## 2013-07-07 DIAGNOSIS — K611 Rectal abscess: Secondary | ICD-10-CM

## 2013-07-07 DIAGNOSIS — K612 Anorectal abscess: Secondary | ICD-10-CM

## 2013-07-07 DIAGNOSIS — K625 Hemorrhage of anus and rectum: Secondary | ICD-10-CM

## 2013-07-07 MED ORDER — LOSARTAN POTASSIUM-HCTZ 100-25 MG PO TABS: 1.0000 | ORAL_TABLET | Freq: Every day | ORAL | Status: DC

## 2013-07-07 MED ORDER — SULFAMETHOXAZOLE-TMP DS 800-160 MG PO TABS: 1.0000 | ORAL_TABLET | Freq: Two times a day (BID) | ORAL | Status: DC

## 2013-07-07 MED ORDER — FLUTICASONE PROPIONATE 50 MCG/ACT NA SUSP: NASAL | Status: DC

## 2013-07-07 NOTE — Progress Notes (Signed)
Subjective:    Patient ID: Stacey Castaneda, female    DOB: [birth year], 43 y.o.   MRN: [id number]  HPI Rectal bleeding after BM started sunday bright red and heavy, monday moring bleeding was moderate /light bright red, monday night and tuesday morning spotting ting of red/pink. Didn't see any pus.  pt reports that for 2 days she experienced tenderness in that area she had her husband to check for her and he didn't notice anything. she has not strained w/BM's.Felt like it was more internal then external.  Have not had any pain since the bleeding started.  She has not had any bleeding in the last 24 hours. She has been having normal bowel movements. No abdominal pain. No fevers chills or sweats. No worsening or alleviating factors.  Review of Systems BP 128/70  Pulse 87  Temp(Src) 98.2 F (36.8 C)  Wt 288 lb (130.636 kg)  SpO2 96%    Allergies  Allergen Reactions  . Lisinopril Cough    Past Medical History  Diagnosis Date  . Anxiety   . Hypertension   . Depression     Past Surgical History  Procedure Laterality Date  . Dilation and curettage of uterus    . Wisdom tooth extraction    . Incision and drainage perirectal abscess N/A 10/30/2012    Procedure: peri rectal INCISION AND DRAINAGE ABSCESS;  Surgeon: Earnstine Regal, MD;  Location: [facility];  Service: General;  Laterality: N/A;    History   Social History  . Marital Status: Married    Spouse Name: N/A    Number of Children: N/A  . Years of Education: N/A   Occupational History  . Not on file.   Social History Main Topics  . Smoking status: Former Smoker    Quit date: 10/31/2006  . Smokeless tobacco: Never Used  . Alcohol Use: Yes  . Drug Use: No  . Sexual Activity: Not on file   Other Topics Concern  . Not on file   Social History Narrative  . No narrative on file    Family History  Problem Relation Age of Onset  . Diabetes Father   . Heart disease Father   . Hypertension Father   . Diabetes Mother    . Hypertension Mother   . Cancer Mother     Outpatient Encounter Prescriptions as of 07/07/2013  Medication Sig  . cetirizine (ZYRTEC) 10 MG tablet Take 10 mg by mouth daily.  . fluticasone (FLONASE) 50 MCG/ACT nasal spray Use two sprays in each nostril daily  . ibuprofen (ADVIL,MOTRIN) 100 MG tablet Take 200 mg by mouth every 6 (six) hours as needed for fever (pain).  Marland Kitchen levonorgestrel (MIRENA) 20 MCG/24HR IUD 1 each by Intrauterine route once.    Marland Kitchen losartan-hydrochlorothiazide (HYZAAR) 100-25 MG per tablet Take 1 tablet by mouth daily.  . Multiple Vitamin (MULTIVITAMIN) tablet Take 1 tablet by mouth daily.  . [DISCONTINUED] fluticasone (FLONASE) 50 MCG/ACT nasal spray Use two sprays in each nostril daily  . [DISCONTINUED] losartan-hydrochlorothiazide (HYZAAR) 100-25 MG per tablet Take 1 tablet by mouth daily.  Marland Kitchen sulfamethoxazole-trimethoprim (BACTRIM DS) 800-160 MG per tablet Take 1 tablet by mouth 2 (two) times daily.  . [DISCONTINUED] fluconazole (DIFLUCAN) 150 MG tablet Take 1 tablet (150 mg total) by mouth once.  . [DISCONTINUED] sodium chloride (OCEAN) 0.65 % nasal spray Place 1 spray into the nose as needed for congestion (congrestion).  . [DISCONTINUED] sulfamethoxazole-trimethoprim (BACTRIM DS) 800-160 MG per tablet Take 1 tablet by  mouth 2 (two) times daily.          Objective:   Physical Exam  Constitutional: She is oriented to person, place, and time. She appears well-developed and well-nourished.  HENT:  Head: Normocephalic and atraumatic.  Neurological: She is alert and oriented to person, place, and time.  Skin: Skin is warm and dry.  Old scar on the left inner buttock cheek has no induration or erythema but I was able to express some pus. Sample collected and sent for culture. She does have some external hemorrhage but they're not inflamed or irritated. Digital rectal exam was benign. Normal external sphincter in the palpable knots or lesions. Rectal exam did not show  any internal lesions or fissures, to explain the bleeding.  Psychiatric: She has a normal mood and affect. Her behavior is normal.          Assessment & Plan:  Rectal bleeding-unclear if this is from internal hemorrhoid or possibly a perirectal abscess that led. I did not see a source of the bleeding and it has resolved over the last 24 hours.  Perirectal abscess-unfortunately she does have pus coming from her old surgical scar. Will refer back to general surgery for further evaluation and treatment. She is afebrile it is not in any significant pain. No induration around the lesion. Culture was sent. I did give her a prescription for Bactrim to fill if she feels like the area is becoming tender, notices more drainage, or develops fevers chills or sweats before I get the culture back.

## 2013-07-07 NOTE — Addendum Note (Signed)
Addended by: Teddy Spike on: 07/07/2013 01:45 PM   Modules accepted: Orders

## 2013-07-09 ENCOUNTER — Ambulatory Visit (INDEPENDENT_AMBULATORY_CARE_PROVIDER_SITE_OTHER): Payer: BC Managed Care – PPO | Admitting: General Surgery

## 2013-07-09 ENCOUNTER — Encounter (INDEPENDENT_AMBULATORY_CARE_PROVIDER_SITE_OTHER): Payer: Self-pay | Admitting: General Surgery

## 2013-07-09 VITALS — BP 142/86 | HR 80 | Resp 16 | Ht 64.0 in | Wt 288.2 lb

## 2013-07-09 DIAGNOSIS — K603 Anal fistula: Secondary | ICD-10-CM

## 2013-07-09 LAB — HERPES SIMPLEX VIRUS CULTURE: ORGANISM ID, BACTERIA: NOT DETECTED

## 2013-07-09 NOTE — Patient Instructions (Signed)
Will refer to Dr. Marcello Moores to discuss placement of seton vs advancement flap

## 2013-07-11 LAB — WOUND CULTURE
Gram Stain: NONE SEEN
Gram Stain: NONE SEEN
Gram Stain: NONE SEEN

## 2013-07-19 NOTE — Progress Notes (Signed)
Patient ID: Stacey Castaneda, female   DOB: May 09, 1970, 43 y.o.   MRN: [id number]  No chief complaint on file.   HPI Stacey Castaneda is a 43 y.o. female.  We're asked to see the patient in consultation by Dr. Madilyn Fireman to evaluate her for a pararectal abscess. The patient is a 43 year old female who has a history of a perirectal abscess. Over the last week or so she has been having some tenderness in her rectum where her old abscess was. She has also noticed some blood in her stool over the last few days. She denies any fevers or chills.  HPI  Past Medical History  Diagnosis Date  . Anxiety   . Hypertension   . Depression     Past Surgical History  Procedure Laterality Date  . Dilation and curettage of uterus    . Wisdom tooth extraction    . Incision and drainage perirectal abscess N/A 10/30/2012    Procedure: peri rectal INCISION AND DRAINAGE ABSCESS;  Surgeon: Earnstine Regal, MD;  Location: [facility];  Service: General;  Laterality: N/A;    Family History  Problem Relation Age of Onset  . Diabetes Father   . Heart disease Father   . Hypertension Father   . Diabetes Mother   . Hypertension Mother   . Cancer Mother     Social History History  Substance Use Topics  . Smoking status: Former Smoker    Quit date: 10/31/2006  . Smokeless tobacco: Never Used  . Alcohol Use: Yes    Allergies  Allergen Reactions  . Lisinopril Cough    Current Outpatient Prescriptions  Medication Sig Dispense Refill  . fluticasone (FLONASE) 50 MCG/ACT nasal spray Use two sprays in each nostril daily  48 g  3  . ibuprofen (ADVIL,MOTRIN) 100 MG tablet Take 200 mg by mouth every 6 (six) hours as needed for fever (pain).      Marland Kitchen levonorgestrel (MIRENA) 20 MCG/24HR IUD 1 each by Intrauterine route once.        Marland Kitchen losartan-hydrochlorothiazide (HYZAAR) 100-25 MG per tablet Take 1 tablet by mouth daily.  90 tablet  3  . Multiple Vitamin (MULTIVITAMIN) tablet Take 1 tablet by mouth daily.      .  cetirizine (ZYRTEC) 10 MG tablet Take 10 mg by mouth daily.      Marland Kitchen sulfamethoxazole-trimethoprim (BACTRIM DS) 800-160 MG per tablet Take 1 tablet by mouth 2 (two) times daily.  28 tablet  0   No current facility-administered medications for this visit.    Review of Systems Review of Systems  Constitutional: Negative.   HENT: Negative.   Eyes: Negative.   Respiratory: Negative.   Cardiovascular: Negative.   Gastrointestinal: Positive for blood in stool and rectal pain.  Endocrine: Negative.   Genitourinary: Negative.   Musculoskeletal: Negative.   Skin: Negative.   Allergic/Immunologic: Negative.   Neurological: Negative.   Hematological: Negative.   Psychiatric/Behavioral: Negative.     Blood pressure 142/86, pulse 80, resp. rate 16, height 5\' 4"  (1.626 m), weight 288 lb 3.2 oz (130.727 kg).  Physical Exam Physical Exam  Constitutional: She is oriented to person, place, and time. She appears well-developed and well-nourished.  HENT:  Head: Normocephalic and atraumatic.  Eyes: Conjunctivae and EOM are normal. Pupils are equal, round, and reactive to light.  Neck: Normal range of motion. Neck supple.  Cardiovascular: Normal rate, regular rhythm and normal heart sounds.   Pulmonary/Chest: Effort normal and breath sounds normal.  Abdominal: Soft. Bowel sounds  are normal.  Genitourinary:  There does not appear to be any significant undrained abscess at this point but I do see some evidence of a palpable cord heading towards the rectum with some mucousy drainage from the rectum  Musculoskeletal: Normal range of motion.  Lymphadenopathy:    She has no cervical adenopathy.  Neurological: She is alert and oriented to person, place, and time.  Skin: Skin is warm and dry.  Psychiatric: She has a normal mood and affect. Her behavior is normal.    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 At this point I would recommend either  and exam under anesthesia with placement of a seton versus a possible drainage procedure with an advancement flap. I think she should be evaluated by our colorectal specialist to see if this would be possible.     Plan    I will plan to refer her to Dr. Marcello Moores who is our colorectal specialist to see what her options would be. We will then proceed accordingly.        TOTH III,Gerhart Ruggieri S 07/19/2013, 5:23 PM

## 2013-07-22 ENCOUNTER — Ambulatory Visit (INDEPENDENT_AMBULATORY_CARE_PROVIDER_SITE_OTHER): Payer: BC Managed Care – PPO | Admitting: General Surgery

## 2013-07-22 ENCOUNTER — Encounter (INDEPENDENT_AMBULATORY_CARE_PROVIDER_SITE_OTHER): Payer: BC Managed Care – PPO | Admitting: General Surgery

## 2013-07-22 VITALS — BP 130/72 | HR 80 | Temp 98.0°F | Resp 18 | Ht 64.0 in | Wt 285.0 lb

## 2013-07-22 DIAGNOSIS — K603 Anal fistula: Secondary | ICD-10-CM

## 2013-07-22 NOTE — Patient Instructions (Signed)

## 2013-07-22 NOTE — Progress Notes (Signed)
Chief Complaint  Patient presents with  . Follow-up    Anal fistula    HISTORY: Stacey Castaneda is a 43 y.o. female who presents to the office with perirectal drainage.  She is s/p I&D last year at the same spot.  She has had several episodes of draiange since then.  She was seen by Dr Marlou Starks who placed her on Bactrim.  She finished these today. Other symptoms include bloody drainage.  This had been occurring for the last year.  It is intermittent in nature.  Her bowel habits are regular and her bowel movements are usually soft.  Her fiber intake is dietary.  She has never had a colonoscopy.  She denies any fecal leakage or loose stools unless with severe diarrhea.  She has no personal or family h/o IBD.  She has had 1 anorectal procedures in the past, drainage of abscess.  No vaginal deliveries.     Past Medical History  Diagnosis Date  . Anxiety   . Hypertension   . Depression       Past Surgical History  Procedure Laterality Date  . Dilation and curettage of uterus    . Wisdom tooth extraction    . Incision and drainage perirectal abscess N/A 10/30/2012    Procedure: peri rectal INCISION AND DRAINAGE ABSCESS;  Surgeon: Earnstine Regal, MD;  Location: [facility];  Service: General;  Laterality: N/A;        Current Outpatient Prescriptions  Medication Sig Dispense Refill  . cetirizine (ZYRTEC) 10 MG tablet Take 10 mg by mouth daily.      . fluticasone (FLONASE) 50 MCG/ACT nasal spray Use two sprays in each nostril daily  48 g  3  . ibuprofen (ADVIL,MOTRIN) 100 MG tablet Take 200 mg by mouth every 6 (six) hours as needed for fever (pain).      Marland Kitchen levonorgestrel (MIRENA) 20 MCG/24HR IUD 1 each by Intrauterine route once.        Marland Kitchen losartan-hydrochlorothiazide (HYZAAR) 100-25 MG per tablet Take 1 tablet by mouth daily.  90 tablet  3  . Multiple Vitamin (MULTIVITAMIN) tablet Take 1 tablet by mouth daily.      Marland Kitchen sulfamethoxazole-trimethoprim (BACTRIM DS) 800-160 MG per tablet Take 1 tablet by mouth  2 (two) times daily.  28 tablet  0   No current facility-administered medications for this visit.      Allergies  Allergen Reactions  . Lisinopril Cough      Family History  Problem Relation Age of Onset  . Diabetes Father   . Heart disease Father   . Hypertension Father   . Diabetes Mother   . Hypertension Mother   . Cancer Mother     History   Social History  . Marital Status: Married    Spouse Name: N/A    Number of Children: N/A  . Years of Education: N/A   Social History Main Topics  . Smoking status: Former Smoker    Quit date: 10/31/2006  . Smokeless tobacco: Never Used  . Alcohol Use: Yes  . Drug Use: No  . Sexual Activity: Not on file   Other Topics Concern  . Not on file   Social History Narrative  . No narrative on file      REVIEW OF SYSTEMS - PERTINENT POSITIVES ONLY: Review of Systems - General ROS: negative for - chills, fever or weight loss Hematological and Lymphatic ROS: negative for - bleeding problems, blood clots or bruising Respiratory ROS: no cough, shortness  of breath, or wheezing Cardiovascular ROS: no chest pain or dyspnea on exertion Gastrointestinal ROS: no abdominal pain, change in bowel habits, or black or bloody stools Genito-Urinary ROS: no dysuria, trouble voiding, or hematuria  EXAM: Filed Vitals:   07/22/13 1123  BP: 130/72  Pulse: 80  Temp: 98 F (36.7 C)  Resp: 18    General appearance: alert and cooperative Resp: clear to auscultation bilaterally Cardio: regular rate and rhythm GI: normal findings: soft, non-tender  Anal Exam Findings: External opening noted in right anterior perianal region. Internal opening palpated anterior midline in the very proximal anal canal    ASSESSMENT AND PLAN: Stacey Castaneda Is a 43 year old female who is status post I&D of a large perirectal abscess approximately one year ago. 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 Her signs and symptoms and physical  exam are all consistent with this. I have recommended an anal exam under anesthesia with possible fistulotomy versus seton placement. We discussed the risk and benefits of both procedures. She has not had any other injuries to her anal canal and I think she would tolerate a shallow fistulotomy if needed. We discussed that I tend to be very conservative with females and fistulotomy, given problems with fecal incontinence later in life. We discussed seton placement as well as potential for additional procedures in the future. All questions were answered. I believe she understands the risk and benefits of the procedures that are proposed. We will schedule this at her convenience. If she does develop another episode of inflammation, I will call in a prescription for Bactrim to help keep this under control until surgery.    [name], MD Colon and Rectal Surgery / [facility]      Visit Diagnoses: 1. Perianal fistula     Primary Care Physician: Beatrice Lecher, MD

## 2013-08-23 ENCOUNTER — Encounter (HOSPITAL_BASED_OUTPATIENT_CLINIC_OR_DEPARTMENT_OTHER): Payer: Self-pay | Admitting: *Deleted

## 2013-08-24 ENCOUNTER — Encounter (HOSPITAL_BASED_OUTPATIENT_CLINIC_OR_DEPARTMENT_OTHER): Payer: Self-pay | Admitting: *Deleted

## 2013-08-24 NOTE — Progress Notes (Signed)
NPO AFTER MN WITH EXCEPTION CLEAR LIQUIDS UNTIL 0800 (NO CREAM/ MILK PRODUCTS).  ARRIVE AT 1230. NEEDS ISTAT, URINE PREG. AND EKG . WILL TAKE ZYRTEC AND DO FLONASE AM DOS W/ SIPS OF WATER.

## 2013-08-25 ENCOUNTER — Encounter (HOSPITAL_BASED_OUTPATIENT_CLINIC_OR_DEPARTMENT_OTHER): Payer: BC Managed Care – PPO | Admitting: Anesthesiology

## 2013-08-25 ENCOUNTER — Encounter (HOSPITAL_BASED_OUTPATIENT_CLINIC_OR_DEPARTMENT_OTHER)
Admission: RE | Disposition: A | Discharge status: Home / Self Care | Payer: Self-pay | Source: Ambulatory Visit | Attending: General Surgery

## 2013-08-25 ENCOUNTER — Encounter (HOSPITAL_BASED_OUTPATIENT_CLINIC_OR_DEPARTMENT_OTHER): Payer: Self-pay | Admitting: Anesthesiology

## 2013-08-25 ENCOUNTER — Ambulatory Visit (HOSPITAL_BASED_OUTPATIENT_CLINIC_OR_DEPARTMENT_OTHER)
Admission: RE | Admit: 2013-08-25 | Discharge: 2013-08-25 | Disposition: A | Discharge status: Home / Self Care | Payer: BC Managed Care – PPO | Source: Ambulatory Visit | Attending: General Surgery | Admitting: General Surgery

## 2013-08-25 ENCOUNTER — Ambulatory Visit (HOSPITAL_BASED_OUTPATIENT_CLINIC_OR_DEPARTMENT_OTHER): Payer: BC Managed Care – PPO | Admitting: Anesthesiology

## 2013-08-25 DIAGNOSIS — Z538 Procedure and treatment not carried out for other reasons: Secondary | ICD-10-CM | POA: Insufficient documentation

## 2013-08-25 DIAGNOSIS — K603 Anal fistula, unspecified: Secondary | ICD-10-CM | POA: Insufficient documentation

## 2013-08-25 HISTORY — DX: Anal fistula: K60.3

## 2013-08-25 HISTORY — DX: Presence of spectacles and contact lenses: Z97.3

## 2013-08-25 HISTORY — DX: Nontoxic goiter, unspecified: E04.9

## 2013-08-25 HISTORY — DX: Anal fistula, unspecified: K60.30

## 2013-08-25 HISTORY — DX: Other seasonal allergic rhinitis: J30.2

## 2013-08-25 HISTORY — DX: Other specified postprocedural states: Z98.890

## 2013-08-25 HISTORY — DX: Other specified postprocedural states: R11.2

## 2013-08-25 LAB — POCT PREGNANCY, URINE: Preg Test, Ur: NEGATIVE

## 2013-08-25 LAB — POCT I-STAT 4, (NA,K, GLUC, HGB,HCT)
Glucose, Bld: 78 mg/dL (ref 70–99)
HCT: 36 % (ref 36.0–46.0)
Hemoglobin: 12.2 g/dL (ref 12.0–15.0)
POTASSIUM: 4.6 meq/L (ref 3.7–5.3)
SODIUM: 140 meq/L (ref 137–147)

## 2013-08-25 SURGERY — EXAM UNDER ANESTHESIA WITH ANAL FISTULECTOMY
Anesthesia: General

## 2013-08-25 MED ORDER — KETAMINE HCL 50 MG/ML IJ SOLN
INTRAMUSCULAR | Status: AC
  Filled 2013-08-25: qty 10

## 2013-08-25 MED ORDER — FENTANYL CITRATE 0.05 MG/ML IJ SOLN
INTRAMUSCULAR | Status: AC
  Filled 2013-08-25: qty 4

## 2013-08-25 MED ORDER — LACTATED RINGERS IV SOLN
INTRAVENOUS | Status: DC
  Filled 2013-08-25: qty 1000

## 2013-08-25 MED ORDER — MIDAZOLAM HCL 2 MG/2ML IJ SOLN
INTRAMUSCULAR | Status: AC
  Filled 2013-08-25: qty 4

## 2013-08-25 SURGICAL SUPPLY — 49 items
APL SKNCLS STERI-STRIP NONHPOA (GAUZE/BANDAGES/DRESSINGS)
BENZOIN TINCTURE PRP APPL 2/3 (GAUZE/BANDAGES/DRESSINGS) ×1 IMPLANT
BLADE HEX COATED 2.75 (ELECTRODE) ×1 IMPLANT
BLADE SURG 10 STRL SS (BLADE) IMPLANT
BLADE SURG 15 STRL LF DISP TIS (BLADE) ×1 IMPLANT
BLADE SURG 15 STRL SS (BLADE)
BRIEF STRETCH FOR OB PAD LRG (UNDERPADS AND DIAPERS) ×2 IMPLANT
CANISTER SUCTION 2500CC (MISCELLANEOUS) ×1 IMPLANT
CLOTH BEACON ORANGE TIMEOUT ST (SAFETY) ×1 IMPLANT
COVER MAYO STAND STRL (DRAPES) ×1 IMPLANT
COVER TABLE BACK 60X90 (DRAPES) ×1 IMPLANT
DECANTER SPIKE VIAL GLASS SM (MISCELLANEOUS) ×1 IMPLANT
DRAPE LG THREE QUARTER DISP (DRAPES) ×2 IMPLANT
DRAPE PED LAPAROTOMY (DRAPES) ×1 IMPLANT
DRAPE UNDERBUTTOCKS STRL (DRAPE) IMPLANT
ELECT BLADE 6.5 .24CM SHAFT (ELECTRODE) IMPLANT
ELECT REM PT RETURN 9FT ADLT (ELECTROSURGICAL)
ELECTRODE REM PT RTRN 9FT ADLT (ELECTROSURGICAL) ×1 IMPLANT
GAUZE SPONGE 4X4 16PLY XRAY LF (GAUZE/BANDAGES/DRESSINGS) IMPLANT
GAUZE VASELINE 3X9 (GAUZE/BANDAGES/DRESSINGS) IMPLANT
GLOVE BIO SURGEON STRL SZ 6.5 (GLOVE) ×2 IMPLANT
GLOVE INDICATOR 7.0 STRL GRN (GLOVE) ×2 IMPLANT
GOWN PREVENTION PLUS LG XLONG (DISPOSABLE) ×1 IMPLANT
GOWN PREVENTION PLUS XLARGE (GOWN DISPOSABLE) ×1 IMPLANT
GOWN STRL REIN XL XLG (GOWN DISPOSABLE) ×1 IMPLANT
LEGGING LITHOTOMY PAIR STRL (DRAPES) IMPLANT
LOOP VESSEL MAXI BLUE (MISCELLANEOUS) IMPLANT
NDL HYPO 25X1 1.5 SAFETY (NEEDLE) ×1 IMPLANT
NDL SAFETY ECLIPSE 18X1.5 (NEEDLE) IMPLANT
NEEDLE HYPO 18GX1.5 SHARP (NEEDLE)
NEEDLE HYPO 25X1 1.5 SAFETY (NEEDLE) IMPLANT
NS IRRIG 500ML POUR BTL (IV SOLUTION) ×1 IMPLANT
PACK BASIN DAY SURGERY FS (CUSTOM PROCEDURE TRAY) ×1 IMPLANT
PAD ABD 8X10 STRL (GAUZE/BANDAGES/DRESSINGS) IMPLANT
PENCIL BUTTON HOLSTER BLD 10FT (ELECTRODE) ×1 IMPLANT
SPONGE GAUZE 4X4 12PLY (GAUZE/BANDAGES/DRESSINGS) IMPLANT
SPONGE SURGIFOAM ABS GEL 12-7 (HEMOSTASIS) IMPLANT
SUT CHROMIC 2 0 SH (SUTURE) ×1 IMPLANT
SUT CHROMIC 3 0 SH 27 (SUTURE) IMPLANT
SUT ETHIBOND 0 (SUTURE) IMPLANT
SUT MON AB 3-0 SH 27 (SUTURE)
SUT MON AB 3-0 SH27 (SUTURE) ×1 IMPLANT
SUT VIC AB 4-0 P-3 18XBRD (SUTURE) IMPLANT
SUT VIC AB 4-0 P3 18 (SUTURE)
SYR CONTROL 10ML LL (SYRINGE) ×1 IMPLANT
TOWEL OR 17X24 6PK STRL BLUE (TOWEL DISPOSABLE) ×1 IMPLANT
TRAY DSU PREP LF (CUSTOM PROCEDURE TRAY) ×3 IMPLANT
TUBE CONNECTING 12X1/4 (SUCTIONS) ×3 IMPLANT
YANKAUER SUCT BULB TIP NO VENT (SUCTIONS) ×1 IMPLANT

## 2013-08-25 NOTE — Anesthesia Preprocedure Evaluation (Addendum)
Anesthesia Evaluation  Patient identified by MRN, date of birth, ID band Patient awake    Reviewed: Allergy & Precautions, H&P , NPO status , Patient's Chart, lab work & pertinent test results  History of Anesthesia Complications (+) PONV  Airway Mallampati: II TM Distance: <3 FB Neck ROM: Full    Dental no notable dental hx.    Pulmonary neg pulmonary ROS, former smoker,  breath sounds clear to auscultation  + decreased breath sounds      Cardiovascular hypertension, Pt. on medications Rhythm:Regular Rate:Normal     Neuro/Psych negative neurological ROS  negative psych ROS   GI/Hepatic negative GI ROS, Neg liver ROS,   Endo/Other  Morbid obesity  Renal/GU negative Renal ROS  negative genitourinary   Musculoskeletal negative musculoskeletal ROS (+)   Abdominal   Peds negative pediatric ROS (+)  Hematology negative hematology ROS (+)   Anesthesia Other Findings   Reproductive/Obstetrics negative OB ROS                          Anesthesia Physical Anesthesia Plan  ASA: III  Anesthesia Plan: General   Post-op Pain Management:    Induction: Intravenous  Airway Management Planned: LMA and Oral ETT  Additional Equipment:   Intra-op Plan:   Post-operative Plan:   Informed Consent: I have reviewed the patients History and Physical, chart, labs and discussed the procedure including the risks, benefits and alternatives for the proposed anesthesia with the patient or authorized representative who has indicated his/her understanding and acceptance.   Dental advisory given  Plan Discussed with: CRNA and Surgeon  Anesthesia Plan Comments: (Unable to obtain iv access  Will request picc line placement)       Anesthesia Quick Evaluation

## 2013-08-25 NOTE — Progress Notes (Signed)
Unable to start iv on patient  Surgery cancelled and will be done on another day when the patient can get a pic line

## 2013-08-27 ENCOUNTER — Telehealth (INDEPENDENT_AMBULATORY_CARE_PROVIDER_SITE_OTHER): Payer: Self-pay | Admitting: General Surgery

## 2013-08-27 NOTE — Telephone Encounter (Signed)
08/27/13 I left a 2nd message for patient today trying to get her sx rescheduled. I left my direct number. skm

## 2013-08-31 ENCOUNTER — Other Ambulatory Visit (INDEPENDENT_AMBULATORY_CARE_PROVIDER_SITE_OTHER): Payer: Self-pay | Admitting: General Surgery

## 2013-08-31 NOTE — Progress Notes (Signed)
Need orders in EPIC.  Surgery on 09/03/13.  Preop on 09/01/13 at 0900am.  Thank You.

## 2013-08-31 NOTE — Patient Instructions (Addendum)
20     Your procedure is scheduled on:  Friday 09/03/2013  Report to [facility] at 0700 AM.  Call this number if you have problems the night before or morning of surgery:  [phone]   Remember:             IF YOU USE CPAP,BRING MASK AND TUBING AM OF SURGERY!             IF YOU DO NOT HAVE YOUR TYPE AND SCREEN DRAWN AT PRE-ADMIT APPOINTMENT, YOU WILL HAVE IT DRAWN AM OF SURGERY!   Do not eat food or drink liquids AFTER MIDNIGHT!  Take these medicines the morning of surgery with A SIP OF WATER: use Flonase nasal spray    [facility] IS NOT RESPONSIBLE FOR ANY BELONGINGS OR VALUABLES BROUGHT TO HOSPITAL.  Marland Kitchen  Leave suitcase in the car. After surgery it may be brought to your room.  For patients admitted to the hospital, checkout time is 11:00 AM the day of              Discharge.    DO NOT WEAR  JEWELRY,MAKE-UP,LOTIONS,POWDERS,PERFUMES,CONTACTS , DENTURES OR BRIDGEWORK ,AND DO NOT WEAR FALSE EYELASHES                                    Patients discharged the day of surgery will not be allowed to drive home.  If going home the same day of surgery, must have someone stay with you first 24 hrs.at home and arrange for someone to drive you home from the [place]: Spouse-David   Special Instructions:              Please read over the following fact sheets that you were given:             1. [place].Tobin Chad     [phone]                              [facility] - Preparing for Surgery Before surgery, you can play an important role.  Because skin is not sterile, your skin needs to be as free of germs as possible.  You can reduce the number of germs on your skin by washing with CHG (chlorahexidine gluconate) soap before surgery.  CHG is an antiseptic cleaner which kills germs and bonds with the skin to continue  killing germs even after washing. Please DO NOT use if you have an allergy to CHG or antibacterial soaps.  If your skin becomes reddened/irritated stop using the CHG and inform your nurse when you arrive at Short Stay. Do not shave (including legs and underarms) for at least 48 hours prior to the first CHG shower.  You may shave your face. Please follow these instructions carefully:  1.  Shower with CHG Soap the night before surgery and the  morning of Surgery.  2.  If you choose to wash your hair, wash your hair first as usual with your  normal  shampoo.  3.  After you shampoo, rinse your hair and body thoroughly to remove the  shampoo.                           4.  Use CHG as you would any other liquid soap.  You can apply chg directly  to the skin and wash                       Gently with a scrungie or clean washcloth.  5.  Apply the CHG Soap to your body ONLY FROM THE NECK DOWN.   Do not use on open                           Wound or open sores. Avoid contact with eyes, ears mouth and genitals (private parts).                        Genitals (private parts) with your normal soap.             6.  Wash thoroughly, paying special attention to the area where your surgery  will be performed.  7.  Thoroughly rinse your body with warm water from the neck down.  8.  DO NOT shower/wash with your normal soap after using and rinsing off  the CHG Soap.                9.  Pat yourself dry with a clean towel.            10.  Wear clean pajamas.            11.  Place clean sheets on your bed the night of your first shower and do not  sleep with pets. Day of Surgery : Do not apply any lotions/deodorants the morning of surgery.  Please wear clean clothes to the hospital/surgery center.  FAILURE TO FOLLOW THESE INSTRUCTIONS MAY RESULT IN THE CANCELLATION OF YOUR SURGERY PATIENT SIGNATURE_________________________________  NURSE  SIGNATURE__________________________________  ________________________________________________________________________

## 2013-09-01 ENCOUNTER — Other Ambulatory Visit (INDEPENDENT_AMBULATORY_CARE_PROVIDER_SITE_OTHER): Payer: Self-pay | Admitting: General Surgery

## 2013-09-01 ENCOUNTER — Other Ambulatory Visit: Payer: Self-pay

## 2013-09-01 ENCOUNTER — Telehealth (INDEPENDENT_AMBULATORY_CARE_PROVIDER_SITE_OTHER): Payer: Self-pay

## 2013-09-01 ENCOUNTER — Encounter (HOSPITAL_COMMUNITY): Payer: Self-pay | Admitting: Pharmacy Technician

## 2013-09-01 ENCOUNTER — Encounter (HOSPITAL_COMMUNITY): Payer: Self-pay

## 2013-09-01 ENCOUNTER — Encounter (HOSPITAL_COMMUNITY)
Admission: RE | Admit: 2013-09-01 | Discharge: 2013-09-01 | Disposition: A | Discharge status: Home / Self Care | Payer: BC Managed Care – PPO | Source: Ambulatory Visit | Attending: General Surgery | Admitting: General Surgery

## 2013-09-01 ENCOUNTER — Ambulatory Visit (HOSPITAL_COMMUNITY)
Admission: RE | Admit: 2013-09-01 | Discharge: 2013-09-01 | Disposition: A | Discharge status: Home / Self Care | Payer: BC Managed Care – PPO | Source: Ambulatory Visit | Attending: Anesthesiology | Admitting: Anesthesiology

## 2013-09-01 DIAGNOSIS — Z01812 Encounter for preprocedural laboratory examination: Secondary | ICD-10-CM | POA: Insufficient documentation

## 2013-09-01 DIAGNOSIS — Z01818 Encounter for other preprocedural examination: Secondary | ICD-10-CM | POA: Insufficient documentation

## 2013-09-01 DIAGNOSIS — K603 Anal fistula: Principal | ICD-10-CM

## 2013-09-01 DIAGNOSIS — K602 Anal fissure, unspecified: Secondary | ICD-10-CM

## 2013-09-01 LAB — CBC
HCT: 40.1 % (ref 36.0–46.0)
Hemoglobin: 14.1 g/dL (ref 12.0–15.0)
MCH: 29.9 pg (ref 26.0–34.0)
MCHC: 35.2 g/dL (ref 30.0–36.0)
MCV: 85 fL (ref 78.0–100.0)
PLATELETS: 267 10*3/uL (ref 150–400)
RBC: 4.72 MIL/uL (ref 3.87–5.11)
RDW: 12.6 % (ref 11.5–15.5)
WBC: 7.9 10*3/uL (ref 4.0–10.5)

## 2013-09-01 LAB — BASIC METABOLIC PANEL
BUN: 11 mg/dL (ref 6–23)
CALCIUM: 9.4 mg/dL (ref 8.4–10.5)
CO2: 27 mEq/L (ref 19–32)
CREATININE: 0.58 mg/dL (ref 0.50–1.10)
Chloride: 99 mEq/L (ref 96–112)
GFR calc Af Amer: >90 mL/min (ref >90)
GFR calc non Af Amer: >90 mL/min (ref >90)
Glucose, Bld: 100 mg/dL — ABNORMAL HIGH (ref 70–99)
Potassium: 3.5 mEq/L — ABNORMAL LOW (ref 3.7–5.3)
Sodium: 138 mEq/L (ref 137–147)

## 2013-09-01 LAB — HCG, SERUM, QUALITATIVE: Preg, Serum: NEGATIVE

## 2013-09-01 NOTE — Progress Notes (Signed)
09/01/13 1036  OBSTRUCTIVE SLEEP APNEA  Have you ever been diagnosed with sleep apnea through a sleep study? No  Do you snore loudly (loud enough to be heard through closed doors)?  0  Do you often feel tired, fatigued, or sleepy during the daytime? 1  Has anyone observed you stop breathing during your sleep? 0  Do you have, or are you being treated for high blood pressure? 1  BMI more than 35 kg/m2? 1  Age over 43 years old? 0  Neck circumference greater than 40 cm/16 inches? 1  Gender: 0  Obstructive Sleep Apnea Score 4  Score 4 or greater  Results sent to PCP

## 2013-09-01 NOTE — Telephone Encounter (Signed)
LMOM for patient to call back and ask for [facility]. Patient is schedule for a IR at [facility] on 09/02/13 @ 2:00 arrive time for an 2:30 apt. She will need to go to the 1 st Floor at [facility]

## 2013-09-01 NOTE — Telephone Encounter (Signed)
[name] called and asked for [place] to put in orders for IR Pic before surgery. She gave [place] the phone number [phone]. And I got the patient an apt for 09-02-13 @ 2:00 for an 2:30 pm apt. I told the people over IR that per Dr Marcello Moores she wanted 2-3 hour pro to surgery, they stated that they did not have the time, so that why she is going tomorrow.

## 2013-09-01 NOTE — Progress Notes (Signed)
09/01/13 0900  OBSTRUCTIVE SLEEP APNEA  Have you ever been diagnosed with sleep apnea through a sleep study? No  Do you snore loudly (loud enough to be heard through closed doors)?  0  Do you often feel tired, fatigued, or sleepy during the daytime? 1  Has anyone observed you stop breathing during your sleep? 0  Do you have, or are you being treated for high blood pressure? 1  BMI more than 35 kg/m2? 1  Age over 43 years old? 0  Neck circumference greater than 40 cm/16 inches? 1  Gender: 0  Obstructive Sleep Apnea Score 4  Score 4 or greater  Results sent to PCP

## 2013-09-02 ENCOUNTER — Ambulatory Visit (HOSPITAL_COMMUNITY)
Admission: RE | Admit: 2013-09-02 | Discharge: 2013-09-02 | Disposition: A | Discharge status: Home / Self Care | Payer: BC Managed Care – PPO | Source: Ambulatory Visit | Attending: General Surgery | Admitting: General Surgery

## 2013-09-02 ENCOUNTER — Other Ambulatory Visit (INDEPENDENT_AMBULATORY_CARE_PROVIDER_SITE_OTHER): Payer: Self-pay | Admitting: General Surgery

## 2013-09-02 ENCOUNTER — Telehealth: Payer: Self-pay | Admitting: Family Medicine

## 2013-09-02 DIAGNOSIS — K602 Anal fissure, unspecified: Secondary | ICD-10-CM

## 2013-09-02 DIAGNOSIS — K603 Anal fistula, unspecified: Secondary | ICD-10-CM | POA: Insufficient documentation

## 2013-09-02 MED ORDER — HEPARIN SOD (PORK) LOCK FLUSH 100 UNIT/ML IV SOLN
INTRAVENOUS | Status: AC
  Filled 2013-09-02: qty 5

## 2013-09-02 MED ORDER — LIDOCAINE HCL 1 % IJ SOLN
INTRAMUSCULAR | Status: AC
  Filled 2013-09-02: qty 20

## 2013-09-02 NOTE — Telephone Encounter (Signed)
Pt stated that she would like to think about it for now. And discuss at her f/u in June.Stacey Castaneda

## 2013-09-02 NOTE — Discharge Instructions (Signed)
PICC Insertion, Care After °Refer to this sheet in the next few weeks. These instructions provide you with information on caring for yourself after your procedure. Your health care provider may also give you more specific instructions. Your treatment has been planned according to current medical practices, but problems sometimes occur. Call your health care provider if you have any problems or questions after your procedure. °WHAT TO EXPECT AFTER THE PROCEDURE °After your procedure, it is typical to have the following: °· Mild discomfort at the insertion site. This should not last more than a day. °HOME CARE INSTRUCTIONS °· Rest at home for the remainder of the day after the procedure. °· You may bend your arm and move it freely. If your PICC is near or at the bend of your elbow, avoid activity with repeated motion at the elbow. °· Avoid lifting heavy objects as instructed by your health care provider. °· Avoid using a crutch with the arm on the same side as your PICC. You may need to use a walker. °Bandage Care °· Keep your PICC bandage (dressing) clean and dry to prevent infection. °· Ask your health care provider when you may shower. To keep the dressing dry, cover the PICC with plastic wrap and tape before showering. If the dressing does become wet, replace it right after the shower. °· Do not soak in the bath, swim, or use hot tubs when you have a PICC. °· Change the PICC dressing as instructed by your health care provider. °· Change your PICC dressing if it becomes loose or wet. °General PICC Care °· Check the PICC insertion site daily for leakage, redness, swelling, or pain. °· Flush the PICC as directed by your health care provider. Let your health care provider know right away if the PICC °is difficult to flush or does not flush. Do not use force to flush the PICC.  °· Do not use a syringe that is less than 10 mL to flush the PICC. °· Never pull or tug on the PICC. °· Avoid blood pressure checks on the arm  with the PICC. °· Keep your PICC identification card with you at all times. °· Do not take the PICC out yourself. Only a trained health care professional should remove the °PICC.  °SEEK MEDICAL CARE IF: °· You have pain in your arm, ear, face, or teeth. °· You have fever or chills. °· You have drainage from the PICC insertion site. °· You have redness or palpate a "cord" around the PICC insertion site. °· You cannot flush the catheter. °SEEK IMMEDIATE MEDICAL CARE IF: °· You have swelling in the arm in which the PICC is inserted. °Document Released: 02/10/2013 Document Reviewed: 12/28/2012 °ExitCare® Patient Information ©2014 [employer]. ° °

## 2013-09-02 NOTE — Telephone Encounter (Signed)
Please call patient: We did receive a copy of the stopping questionnaire that was done prior to her surgery. She did score high risk for possible sleep apnea. If she would like rest investigate this further we cannot should order a home sleep study. Please let me know she would like to move forward with this or not.

## 2013-09-02 NOTE — Procedures (Signed)
Placement of right arm basilic PICC.  Tip in lower SVC and ready to use.

## 2013-09-03 ENCOUNTER — Ambulatory Visit (HOSPITAL_COMMUNITY): Payer: BC Managed Care – PPO | Admitting: Certified Registered Nurse Anesthetist

## 2013-09-03 ENCOUNTER — Encounter (HOSPITAL_COMMUNITY): Payer: BC Managed Care – PPO | Admitting: Certified Registered Nurse Anesthetist

## 2013-09-03 ENCOUNTER — Ambulatory Visit (HOSPITAL_BASED_OUTPATIENT_CLINIC_OR_DEPARTMENT_OTHER)
Admission: RE | Admit: 2013-09-03 | Discharge: 2013-09-03 | Disposition: A | Discharge status: Home / Self Care | Payer: BC Managed Care – PPO | Source: Ambulatory Visit | Attending: General Surgery | Admitting: General Surgery

## 2013-09-03 ENCOUNTER — Encounter (HOSPITAL_COMMUNITY): Payer: Self-pay | Admitting: *Deleted

## 2013-09-03 ENCOUNTER — Encounter (HOSPITAL_COMMUNITY)
Admission: RE | Disposition: A | Discharge status: Home / Self Care | Payer: Self-pay | Source: Ambulatory Visit | Attending: General Surgery

## 2013-09-03 DIAGNOSIS — Z87891 Personal history of nicotine dependence: Secondary | ICD-10-CM | POA: Insufficient documentation

## 2013-09-03 DIAGNOSIS — Z79899 Other long term (current) drug therapy: Secondary | ICD-10-CM | POA: Insufficient documentation

## 2013-09-03 DIAGNOSIS — I10 Essential (primary) hypertension: Secondary | ICD-10-CM | POA: Insufficient documentation

## 2013-09-03 DIAGNOSIS — F329 Major depressive disorder, single episode, unspecified: Secondary | ICD-10-CM | POA: Insufficient documentation

## 2013-09-03 DIAGNOSIS — K603 Anal fistula, unspecified: Secondary | ICD-10-CM | POA: Insufficient documentation

## 2013-09-03 DIAGNOSIS — F411 Generalized anxiety disorder: Secondary | ICD-10-CM | POA: Insufficient documentation

## 2013-09-03 DIAGNOSIS — Z888 Allergy status to other drugs, medicaments and biological substances status: Secondary | ICD-10-CM | POA: Insufficient documentation

## 2013-09-03 DIAGNOSIS — K648 Other hemorrhoids: Secondary | ICD-10-CM | POA: Insufficient documentation

## 2013-09-03 DIAGNOSIS — F3289 Other specified depressive episodes: Secondary | ICD-10-CM | POA: Insufficient documentation

## 2013-09-03 HISTORY — PX: EVALUATION UNDER ANESTHESIA WITH ANAL FISTULECTOMY: SHX5621

## 2013-09-03 HISTORY — PX: PLACEMENT OF SETON: SHX6029

## 2013-09-03 SURGERY — EXAM UNDER ANESTHESIA WITH ANAL FISTULECTOMY
Anesthesia: General | Site: Rectum

## 2013-09-03 SURGERY — EXAM UNDER ANESTHESIA WITH ANAL FISTULECTOMY
Anesthesia: Choice | Site: Rectum

## 2013-09-03 MED ORDER — BUPIVACAINE-EPINEPHRINE (PF) 0.25% -1:200000 IJ SOLN
INTRAMUSCULAR | Status: AC
  Filled 2013-09-03: qty 30

## 2013-09-03 MED ORDER — PROPOFOL 10 MG/ML IV BOLUS
INTRAVENOUS | Status: AC
  Filled 2013-09-03: qty 20

## 2013-09-03 MED ORDER — OXYCODONE HCL 5 MG PO TABS: 5.0000 mg | ORAL_TABLET | ORAL | Status: DC | PRN

## 2013-09-03 MED ORDER — FENTANYL CITRATE 0.05 MG/ML IJ SOLN: 25.0000 ug | INTRAMUSCULAR | Status: DC | PRN

## 2013-09-03 MED ORDER — SODIUM CHLORIDE 0.9 % IV SOLN
250.0000 mL | INTRAVENOUS | Status: DC | PRN
  Administered 2013-09-03: 11:00:00 via INTRAVENOUS

## 2013-09-03 MED ORDER — ONDANSETRON HCL 4 MG/2ML IJ SOLN
INTRAMUSCULAR | Status: AC
  Filled 2013-09-03: qty 2

## 2013-09-03 MED ORDER — FENTANYL CITRATE 0.05 MG/ML IJ SOLN
INTRAMUSCULAR | Status: DC | PRN
  Administered 2013-09-03 (×5): 50 ug via INTRAVENOUS

## 2013-09-03 MED ORDER — DEXAMETHASONE SODIUM PHOSPHATE 10 MG/ML IJ SOLN
INTRAMUSCULAR | Status: AC
  Filled 2013-09-03: qty 1

## 2013-09-03 MED ORDER — LIDOCAINE 5 % EX OINT: 1.0000 "application " | TOPICAL_OINTMENT | CUTANEOUS | Status: DC | PRN

## 2013-09-03 MED ORDER — ATROPINE SULFATE 0.4 MG/ML IJ SOLN
INTRAMUSCULAR | Status: AC
  Filled 2013-09-03: qty 1

## 2013-09-03 MED ORDER — NEOSTIGMINE METHYLSULFATE 10 MG/10ML IV SOLN
INTRAVENOUS | Status: AC
  Filled 2013-09-03: qty 1

## 2013-09-03 MED ORDER — EPHEDRINE SULFATE 50 MG/ML IJ SOLN
INTRAMUSCULAR | Status: AC
  Filled 2013-09-03: qty 1

## 2013-09-03 MED ORDER — SODIUM CHLORIDE 0.9 % IJ SOLN: 3.0000 mL | Freq: Two times a day (BID) | INTRAMUSCULAR | Status: DC

## 2013-09-03 MED ORDER — FENTANYL CITRATE 0.05 MG/ML IJ SOLN
INTRAMUSCULAR | Status: AC
  Filled 2013-09-03: qty 5

## 2013-09-03 MED ORDER — LACTATED RINGERS IV SOLN
INTRAVENOUS | Status: DC | PRN
  Administered 2013-09-03: 08:00:00 via INTRAVENOUS

## 2013-09-03 MED ORDER — ROCURONIUM BROMIDE 100 MG/10ML IV SOLN
INTRAVENOUS | Status: DC | PRN
  Administered 2013-09-03: 30 mg via INTRAVENOUS

## 2013-09-03 MED ORDER — OXYCODONE HCL 5 MG PO TABS
5.0000 mg | ORAL_TABLET | ORAL | Status: DC | PRN
  Administered 2013-09-03: 5 mg via ORAL
  Filled 2013-09-03: qty 1

## 2013-09-03 MED ORDER — LACTATED RINGERS IV SOLN
INTRAVENOUS | Status: DC
Start: 2013-09-03 — End: 2013-09-03

## 2013-09-03 MED ORDER — LIDOCAINE HCL (CARDIAC) 20 MG/ML IV SOLN
INTRAVENOUS | Status: DC | PRN
  Administered 2013-09-03: 100 mg via INTRAVENOUS

## 2013-09-03 MED ORDER — LIDOCAINE HCL (CARDIAC) 20 MG/ML IV SOLN
INTRAVENOUS | Status: AC
  Filled 2013-09-03: qty 5

## 2013-09-03 MED ORDER — MIDAZOLAM HCL 5 MG/5ML IJ SOLN
INTRAMUSCULAR | Status: DC | PRN
  Administered 2013-09-03: 2 mg via INTRAVENOUS

## 2013-09-03 MED ORDER — GLYCOPYRROLATE 0.2 MG/ML IJ SOLN
INTRAMUSCULAR | Status: AC
  Filled 2013-09-03: qty 3

## 2013-09-03 MED ORDER — PROMETHAZINE HCL 25 MG/ML IJ SOLN: 6.2500 mg | INTRAMUSCULAR | Status: DC | PRN

## 2013-09-03 MED ORDER — METOCLOPRAMIDE HCL 5 MG/ML IJ SOLN
INTRAMUSCULAR | Status: DC | PRN
  Administered 2013-09-03: 10 mg via INTRAVENOUS

## 2013-09-03 MED ORDER — DOCUSATE SODIUM 100 MG PO CAPS: 100.0000 mg | ORAL_CAPSULE | Freq: Two times a day (BID) | ORAL | Status: DC

## 2013-09-03 MED ORDER — BUPIVACAINE-EPINEPHRINE 0.25% -1:200000 IJ SOLN
INTRAMUSCULAR | Status: DC | PRN
  Administered 2013-09-03: 20 mL

## 2013-09-03 MED ORDER — GLYCOPYRROLATE 0.2 MG/ML IJ SOLN
INTRAMUSCULAR | Status: DC | PRN
  Administered 2013-09-03: .8 mg via INTRAVENOUS

## 2013-09-03 MED ORDER — DEXAMETHASONE SODIUM PHOSPHATE 10 MG/ML IJ SOLN
INTRAMUSCULAR | Status: DC | PRN
  Administered 2013-09-03: 10 mg via INTRAVENOUS

## 2013-09-03 MED ORDER — METOCLOPRAMIDE HCL 5 MG/ML IJ SOLN
INTRAMUSCULAR | Status: AC
  Filled 2013-09-03: qty 2

## 2013-09-03 MED ORDER — SODIUM CHLORIDE 0.9 % IJ SOLN
INTRAMUSCULAR | Status: AC
  Filled 2013-09-03: qty 10

## 2013-09-03 MED ORDER — ACETAMINOPHEN 325 MG PO TABS: 650.0000 mg | ORAL_TABLET | ORAL | Status: DC | PRN

## 2013-09-03 MED ORDER — ACETAMINOPHEN 650 MG RE SUPP
650.0000 mg | RECTAL | Status: DC | PRN
  Filled 2013-09-03: qty 1

## 2013-09-03 MED ORDER — SUCCINYLCHOLINE CHLORIDE 20 MG/ML IJ SOLN
INTRAMUSCULAR | Status: DC | PRN
  Administered 2013-09-03: 150 mg via INTRAVENOUS

## 2013-09-03 MED ORDER — ROCURONIUM BROMIDE 100 MG/10ML IV SOLN
INTRAVENOUS | Status: AC
  Filled 2013-09-03: qty 1

## 2013-09-03 MED ORDER — ONDANSETRON HCL 4 MG/2ML IJ SOLN
INTRAMUSCULAR | Status: DC | PRN
  Administered 2013-09-03: 4 mg via INTRAVENOUS

## 2013-09-03 MED ORDER — SODIUM CHLORIDE 0.9 % IJ SOLN: 3.0000 mL | INTRAMUSCULAR | Status: DC | PRN

## 2013-09-03 MED ORDER — PROPOFOL 10 MG/ML IV BOLUS
INTRAVENOUS | Status: DC | PRN
  Administered 2013-09-03: 200 mg via INTRAVENOUS

## 2013-09-03 MED ORDER — NEOSTIGMINE METHYLSULFATE 10 MG/10ML IV SOLN
INTRAVENOUS | Status: DC | PRN
  Administered 2013-09-03: 5 mg via INTRAVENOUS

## 2013-09-03 MED ORDER — MEPERIDINE HCL 50 MG/ML IJ SOLN: 6.2500 mg | INTRAMUSCULAR | Status: DC | PRN

## 2013-09-03 MED ORDER — MIDAZOLAM HCL 2 MG/2ML IJ SOLN
INTRAMUSCULAR | Status: AC
  Filled 2013-09-03: qty 2

## 2013-09-03 MED ORDER — 0.9 % SODIUM CHLORIDE (POUR BTL) OPTIME
TOPICAL | Status: DC | PRN
  Administered 2013-09-03: 1000 mL

## 2013-09-03 SURGICAL SUPPLY — 38 items
BLADE HEX COATED 2.75 (ELECTRODE) ×2 IMPLANT
BLADE SURG 15 STRL LF DISP TIS (BLADE) ×1 IMPLANT
BLADE SURG 15 STRL SS (BLADE) ×2
BRIEF STRETCH FOR OB PAD LRG (UNDERPADS AND DIAPERS) ×2 IMPLANT
CANISTER SUCTION 2500CC (MISCELLANEOUS) ×2 IMPLANT
DECANTER SPIKE VIAL GLASS SM (MISCELLANEOUS) ×1 IMPLANT
DRAPE LAPAROSCOPIC ABDOMINAL (DRAPES) ×1 IMPLANT
DRSG PAD ABDOMINAL 8X10 ST (GAUZE/BANDAGES/DRESSINGS) IMPLANT
ELECT REM PT RETURN 9FT ADLT (ELECTROSURGICAL) ×2
ELECTRODE REM PT RTRN 9FT ADLT (ELECTROSURGICAL) ×1 IMPLANT
GAUZE PACKING IODOFORM 1 (PACKING) ×1 IMPLANT
GAUZE SPONGE 4X4 16PLY XRAY LF (GAUZE/BANDAGES/DRESSINGS) ×2 IMPLANT
GLOVE BIOGEL M STRL SZ7.5 (GLOVE) ×1 IMPLANT
GLOVE BIOGEL PI IND STRL 7.0 (GLOVE) ×1 IMPLANT
GLOVE BIOGEL PI INDICATOR 7.0 (GLOVE) ×1
GOWN STRL REUS W/TWL LRG LVL3 (GOWN DISPOSABLE) ×2 IMPLANT
GOWN STRL REUS W/TWL XL LVL3 (GOWN DISPOSABLE) ×4 IMPLANT
KIT BASIN OR (CUSTOM PROCEDURE TRAY) ×2 IMPLANT
LOOP VESSEL MAXI BLUE (MISCELLANEOUS) ×1 IMPLANT
LUBRICANT JELLY K Y 4OZ (MISCELLANEOUS) ×2 IMPLANT
NDL HYPO 25X1 1.5 SAFETY (NEEDLE) ×1 IMPLANT
NDL SAFETY ECLIPSE 18X1.5 (NEEDLE) IMPLANT
NEEDLE HYPO 18GX1.5 SHARP (NEEDLE)
NEEDLE HYPO 25X1 1.5 SAFETY (NEEDLE) ×2 IMPLANT
NS IRRIG 1000ML POUR BTL (IV SOLUTION) ×2 IMPLANT
PACK LITHOTOMY IV (CUSTOM PROCEDURE TRAY) ×2 IMPLANT
PAD ABD 8X10 STRL (GAUZE/BANDAGES/DRESSINGS) ×1 IMPLANT
PENCIL BUTTON HOLSTER BLD 10FT (ELECTRODE) ×2 IMPLANT
SPONGE GAUZE 4X4 12PLY (GAUZE/BANDAGES/DRESSINGS) ×2 IMPLANT
SPONGE SURGIFOAM ABS GEL 100 (HEMOSTASIS) IMPLANT
SPONGE SURGIFOAM ABS GEL 12-7 (HEMOSTASIS) IMPLANT
SUT CHROMIC 2 0 SH (SUTURE) IMPLANT
SUT CHROMIC 3 0 SH 27 (SUTURE) IMPLANT
SUT ETHIBOND 0 36 GRN (SUTURE) ×1 IMPLANT
SUT SILK 2 0 SH (SUTURE) ×1 IMPLANT
SYR CONTROL 10ML LL (SYRINGE) ×2 IMPLANT
UNDERPAD 30X30 INCONTINENT (UNDERPADS AND DIAPERS) ×2 IMPLANT
YANKAUER SUCT BULB TIP 10FT TU (MISCELLANEOUS) ×2 IMPLANT

## 2013-09-03 NOTE — Discharge Instructions (Signed)
ANORECTAL SURGERY: POST OP INSTRUCTIONS °1. Take your usually prescribed home medications unless otherwise directed. °2. DIET: During the first few hours after surgery sip on some liquids until you are able to urinate.  It is normal to not urinate for several hours after this surgery.  If you feel uncomfortable, please contact the office for instructions.  After you are able to urinate,you may eat, if you feel like it.  Follow a light bland diet the first 24 hours after arrival home, such as soup, liquids, crackers, etc.  Be sure to include lots of fluids daily (6-8 glasses).  Avoid fast food or heavy meals, as your are more likely to get nauseated.  Eat a low fat diet the next few days after surgery.  Limit caffeine intake to 1-2 servings a day. °3. PAIN CONTROL: °a. Pain is best controlled by a usual combination of several different methods TOGETHER: °i. Muscle relaxation °1.  Soak in a warm bath (or Sitz bath) three times a day and after bowel movements.  Continue to do this until all pain is resolved. °ii. Over the counter pain medication °iii. Prescription pain medication °b. Most patients will experience some swelling and discomfort in the anus/rectal area and incisions.  Heat such as warm towels, sitz baths, warm baths, etc to help relax tight/sore spots and speed recovery.  Some people prefer to use ice, especially in the first couple days after surgery, as it may decrease the pain and swelling, or alternate between ice & heat.  Experiment to what works for you.  Swelling and bruising can take several weeks to resolve.  Pain can take even longer to completely resolve. °c. It is helpful to take an over-the-counter pain medication regularly for the first few weeks.  Choose one of the following that works best for you: °i. Naproxen (Aleve, etc)  Two 220mg tabs twice a day °ii. Ibuprofen (Advil, etc) Three 200mg tabs four times a day (every meal & bedtime) °d. A  prescription for pain medication (such as  percocet, oxycodone, hydrocodone, etc) should be given to you upon discharge.  Take your pain medication as prescribed.  °i. If you are having problems/concerns with the prescription medicine (does not control pain, nausea, vomiting, rash, itching, etc), please call us [phone] to see if we need to switch you to a different pain medicine that will work better for you and/or control your side effect better. °ii. If you need a refill on your pain medication, please contact your pharmacy.  They will contact our office to request authorization. Prescriptions will not be filled after 5 pm or on week-ends. °4. KEEP YOUR BOWELS REGULAR and AVOID CONSTIPATION °a. The goal is one to two soft bowel movements a day.  You should at least have a bowel movement every other day. °b. Avoid getting constipated.  Between the surgery and the pain medications, it is common to experience some constipation. This can be very painful after rectal surgery.  Increasing fluid intake and taking a fiber supplement (such as Metamucil, Citrucel, FiberCon, etc) 1-2 times a day regularly will usually help prevent this problem from occurring.  A stool softener like colace is also recommended.  This can be purchased over the counter at your pharmacy.  You can take it up to 3 times a day.  If you do not have a bowel movement after 24 hrs since your surgery, take one does of milk of magnesia.  If you still haven't had a bowel movement 8-12   hours after that dose, take another dose.  If you don't have a bowel movement 48 hrs after surgery, purchase a Fleets enema from the drug store and administer gently per package instructions.  If you still are having trouble with your bowel movements after that, please call the office for further instructions. °c. If you develop diarrhea or have many loose bowel movements, simplify your diet to bland foods & liquids for a few days.  Stop any stool softeners and decrease your fiber supplement.  Switching to mild  anti-diarrheal medications (Kayopectate, Pepto Bismol) can help.  If this worsens or does not improve, please call us. ° °5. Wound Care °a. Remove your bandages before your first bowel movement or 8 hours after surgery.     °b. Remove any wound packing material at this tim,e as well.  You do not need to repack the wound unless instructed otherwise.  Wear an absorbent pad or soft cotton gauze in your underwear to catch any drainage and help keep the area clean. You should change this every 2-3 hours while awake. °c. Keep the area clean and dry.  Bathe / shower every day, especially after bowel movements.  Keep the area clean by showering / bathing over the incision / wound.   It is okay to soak an open wound to help wash it.  Wet wipes or showers / gentle washing after bowel movements is often less traumatic than regular toilet paper. °d. You may have some styrofoam-like soft packing in the rectum which will come out with the first bowel movement.  °e. You will often notice bleeding with bowel movements.  This should slow down by the end of the first week of surgery °f. Expect some drainage.  This should slow down, too, by the end of the first week of surgery.  Wear an absorbent pad or soft cotton gauze in your underwear until the drainage stops. °g. Do Not sit on a rubber or pillow ring.  This can make you symptoms worse.  You may sit on a soft pillow if needed.  °6. ACTIVITIES as tolerated:   °a. You may resume regular (light) daily activities beginning the next day--such as daily self-care, walking, climbing stairs--gradually increasing activities as tolerated.  If you can walk 30 minutes without difficulty, it is safe to try more intense activity such as jogging, treadmill, bicycling, low-impact aerobics, swimming, etc. °b. Save the most intensive and strenuous activity for last such as sit-ups, heavy lifting, contact sports, etc  Refrain from any heavy lifting or straining until you are off narcotics for pain  control.   °c. You may drive when you are no longer taking prescription pain medication, you can comfortably sit for long periods of time, and you can safely maneuver your car and apply brakes. °d. You may have sexual intercourse when it is comfortable.  °7. FOLLOW UP in our office °a. Please call [facility] at [phone] to set up an appointment to see your surgeon in the office for a follow-up appointment approximately 3-4 weeks after your surgery. °b. Make sure that you call for this appointment the day you arrive home to insure a convenient appointment time. °10. IF YOU HAVE DISABILITY OR FAMILY LEAVE FORMS, BRING THEM TO THE OFFICE FOR PROCESSING.  DO NOT GIVE THEM TO YOUR DOCTOR. ° ° ° ° °WHEN TO CALL US [phone]: °1. Poor pain control °2. Reactions / problems with new medications (rash/itching, nausea, etc)  °3. Fever over 101.5 F (38.5   C) 4. Inability to urinate 5. Nausea and/or vomiting 6. Worsening swelling or bruising 7. Continued bleeding from incision. 8. Increased pain, redness, or drainage from the incision  The clinic staff is available to answer your questions during regular business hours (8:30am-5pm).  Please dont hesitate to call and ask to speak to one of our nurses for clinical concerns.   A surgeon from [facility] is always on call at the hospitals   If you have a medical emergency, go to the nearest emergency room or call 911.    [facility], [place], [place], [place], [place]  [address] ? MAIN: [phone] ? TOLL FREE: [phone] ? FAX [phone] [email or web address]

## 2013-09-03 NOTE — Anesthesia Preprocedure Evaluation (Signed)
Anesthesia Evaluation  Patient identified by MRN, date of birth, ID band Patient awake    Reviewed: Allergy & Precautions, H&P , NPO status , Patient's Chart, lab work & pertinent test results  History of Anesthesia Complications (+) PONV  Airway Mallampati: II TM Distance: >3 FB Neck ROM: Full    Dental no notable dental hx. (+) Teeth Intact   Pulmonary former smoker,  breath sounds clear to auscultation  Pulmonary exam normal       Cardiovascular hypertension, Pt. on medications Rhythm:Regular Rate:Normal     Neuro/Psych negative neurological ROS  negative psych ROS   GI/Hepatic negative GI ROS, Neg liver ROS,   Endo/Other  Morbid obesity  Renal/GU negative Renal ROS  negative genitourinary   Musculoskeletal negative musculoskeletal ROS (+)   Abdominal   Peds negative pediatric ROS (+)  Hematology negative hematology ROS (+)   Anesthesia Other Findings   Reproductive/Obstetrics negative OB ROS                           Anesthesia Physical Anesthesia Plan  ASA: III  Anesthesia Plan: General   Post-op Pain Management:    Induction: Intravenous  Airway Management Planned: Oral ETT  Additional Equipment:   Intra-op Plan:   Post-operative Plan: Extubation in OR  Informed Consent: I have reviewed the patients History and Physical, chart, labs and discussed the procedure including the risks, benefits and alternatives for the proposed anesthesia with the patient or authorized representative who has indicated his/her understanding and acceptance.   Dental advisory given  Plan Discussed with: CRNA  Anesthesia Plan Comments:         Anesthesia Quick Evaluation

## 2013-09-03 NOTE — Op Note (Signed)
09/03/2013  11:06 AM  PATIENT:  Stacey Castaneda  43 y.o. female  Patient Care Team: Hali Marry, MD as PCP - General  PRE-OPERATIVE DIAGNOSIS:  PERIANAL FISTULA  POST-OPERATIVE DIAGNOSIS:  PERIANAL FISTULA  PROCEDURE:  EXAM UNDER ANESTHESIA  PLACEMENT OF SETON  SURGEON:  Surgeon(s): Leighton Ruff, MD  ASSISTANT: none   ANESTHESIA:   general  SPECIMEN:  No Specimen  DISPOSITION OF SPECIMEN:  N/A  COUNTS:  YES  PLAN OF CARE: Discharge to home after PACU  PATIENT DISPOSITION:  PACU - hemodynamically stable.  INDICATION: This is a 43 y.o. 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     OR FINDINGS: L anterior anal fistula with internal opening located at posterior midline  DESCRIPTION: the patient was identified in the preoperative holding area and taken to the OR where they were laid on the operating room table.  General anesthesia was induced without difficulty. The patient was then positioned in prone jackknife position with buttocks gently taped apart.  The patient was then prepped and draped in usual sterile fashion.  SCDs were noted to be in place prior to the initiation of anesthesia. A surgical timeout was performed indicating the correct patient, procedure, positioning and need for preoperative antibiotics.  I began with a digital rectal exam.  There were no abnormalities palpated except for soft, non-inflamed internal hemorrhoids.  I then placed a Hill-Ferguson anoscope into the anal canal and evaluated this completely.  There were no other abnormalities except for a posterior midline opening.  I placed an S shaped fistula probe into the external opening and positioned this to the internal opening.  It appeared to encompass the entire external sphincter.  I decided to place a seton.  A vessel loop was placed through the fistula.  This was secured with Ethibond suture in 3 places.  The external opening was enlarged to encourage drainage.   Hemostasis was achieved with electrocautery.  A rectal block was placed with Marcaine with epi.  The patient was awakened from anesthesia and sent to the PACU in stable condition.  All counts correct per OR staff.

## 2013-09-03 NOTE — Transfer of Care (Signed)
Immediate Anesthesia Transfer of Care Note  Patient: Stacey Castaneda  Procedure(s) Performed: Procedure(s) (LRB): EXAM UNDER ANESTHESIA  (N/A) PLACEMENT OF SETON (N/A)  Patient Location: PACU  Anesthesia Type: General  Level of Consciousness: sedated, patient cooperative and responds to stimulation  Airway & Oxygen Therapy: Patient Spontanous Breathing and Patient connected to face mask oxgen  Post-op Assessment: Report given to PACU RN and Post -op Vital signs reviewed and stable  Post vital signs: Reviewed and stable  Complications: No apparent anesthesia complications

## 2013-09-03 NOTE — Anesthesia Postprocedure Evaluation (Signed)
  Anesthesia Post-op Note  Patient: Stacey Castaneda  Procedure(s) Performed: Procedure(s) (LRB): EXAM UNDER ANESTHESIA  (N/A) PLACEMENT OF SETON (N/A)  Patient Location: PACU  Anesthesia Type: General  Level of Consciousness: awake and alert   Airway and Oxygen Therapy: Patient Spontanous Breathing  Post-op Pain: mild  Post-op Assessment: Post-op Vital signs reviewed, Patient's Cardiovascular Status Stable, Respiratory Function Stable, Patent Airway and No signs of Nausea or vomiting  Last Vitals:  Filed Vitals:   09/03/13 1229  BP: 138/79  Pulse: 78  Temp: 36.3 C  Resp: 16    Post-op Vital Signs: stable   Complications: No apparent anesthesia complications

## 2013-09-03 NOTE — H&P (Signed)
HISTORY: Stacey Castaneda is a 43 y.o. female who presents to the office with perirectal drainage. She is s/p I&D last year at the same spot. She has had several episodes of draiange since then. She was seen by Dr Marlou Starks who placed her on Bactrim. She finished these today. Other symptoms include bloody drainage. This had been occurring for the last year. It is intermittent in nature. Her bowel habits are regular and her bowel movements are usually soft. Her fiber intake is dietary. She has never had a colonoscopy. She denies any fecal leakage or loose stools unless with severe diarrhea. She has no personal or family h/o IBD. She has had 1 anorectal procedures in the past, drainage of abscess. No vaginal deliveries.  Past Medical History   Diagnosis  Date   .  Anxiety    .  Hypertension    .  Depression     Past Surgical History   Procedure  Laterality  Date   .  Dilation and curettage of uterus     .  Wisdom tooth extraction     .  Incision and drainage perirectal abscess  N/A  10/30/2012     Procedure: peri rectal INCISION AND DRAINAGE ABSCESS; Surgeon: Earnstine Regal, MD; Location: [facility]; Service: General; Laterality: N/A;    Current Outpatient Prescriptions   Medication  Sig  Dispense  Refill   .  cetirizine (ZYRTEC) 10 MG tablet  Take 10 mg by mouth daily.     .  fluticasone (FLONASE) 50 MCG/ACT nasal spray  Use two sprays in each nostril daily  48 g  3   .  ibuprofen (ADVIL,MOTRIN) 100 MG tablet  Take 200 mg by mouth every 6 (six) hours as needed for fever (pain).     Marland Kitchen  levonorgestrel (MIRENA) 20 MCG/24HR IUD  1 each by Intrauterine route once.     Marland Kitchen  losartan-hydrochlorothiazide (HYZAAR) 100-25 MG per tablet  Take 1 tablet by mouth daily.  90 tablet  3   .  Multiple Vitamin (MULTIVITAMIN) tablet  Take 1 tablet by mouth daily.     Marland Kitchen  sulfamethoxazole-trimethoprim (BACTRIM DS) 800-160 MG per tablet  Take 1 tablet by mouth 2 (two) times daily.  28 tablet  0    No current  facility-administered medications for this visit.    Allergies   Allergen  Reactions   .  Lisinopril  Cough    Family History   Problem  Relation  Age of Onset   .  Diabetes  Father    .  Heart disease  Father    .  Hypertension  Father    .  Diabetes  Mother    .  Hypertension  Mother    .  Cancer  Mother     History    Social History   .  Marital Status:  Married     Spouse Name:  N/A     Number of Children:  N/A   .  Years of Education:  N/A    Social History Main Topics   .  Smoking status:  Former Smoker     Quit date:  10/31/2006   .  Smokeless tobacco:  Never Used   .  Alcohol Use:  Yes   .  Drug Use:  No   .  Sexual Activity:  Not on file    Other Topics  Concern   .  Not on file    Social History Narrative   .  No narrative on file   REVIEW OF SYSTEMS - PERTINENT POSITIVES ONLY:  Review of Systems - General ROS: negative for - chills, fever or weight loss  Hematological and Lymphatic ROS: negative for - bleeding problems, blood clots or bruising  Respiratory ROS: no cough, shortness of breath, or wheezing  Cardiovascular ROS: no chest pain or dyspnea on exertion  Gastrointestinal ROS: no abdominal pain, change in bowel habits, or black or bloody stools  Genito-Urinary ROS: no dysuria, trouble voiding, or hematuria  EXAM:  Filed Vitals:   09/03/13 0645  BP: 156/96  Pulse: 81  Temp: 98 F (36.7 C)  Resp: 18   General appearance: alert and cooperative  Resp: clear to auscultation bilaterally  Cardio: regular rate and rhythm  GI: normal findings: soft, non-tender  Anal Exam Findings: External opening noted in right anterior perianal region. Internal opening palpated anterior midline in the very proximal anal canal  ASSESSMENT AND PLAN:  Stacey Castaneda Is a 44 year old female who is status post I&D of a large perirectal abscess approximately one year ago. 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 Her signs and symptoms and physical  exam are all consistent with this. I have recommended an anal exam under anesthesia with possible fistulotomy versus seton placement. We discussed the risk and benefits of both procedures. She has not had any other injuries to her anal canal and I think she would tolerate a shallow fistulotomy if needed. We discussed that I tend to be very conservative with females and fistulotomy, given problems with fecal incontinence later in life. We discussed seton placement as well as potential for additional procedures in the future. All questions were answered. I believe she understands the risk and benefits of the procedures that are proposed. We will schedule this at her convenience. If she does develop another episode of inflammation, I will call in a prescription for Bactrim to help keep this under control until surgery.  [name], MD  Colon and Rectal Surgery / [facility]

## 2013-09-07 ENCOUNTER — Encounter (HOSPITAL_COMMUNITY): Payer: Self-pay | Admitting: General Surgery

## 2013-10-13 ENCOUNTER — Encounter (INDEPENDENT_AMBULATORY_CARE_PROVIDER_SITE_OTHER): Payer: Self-pay | Admitting: General Surgery

## 2013-10-13 ENCOUNTER — Ambulatory Visit (INDEPENDENT_AMBULATORY_CARE_PROVIDER_SITE_OTHER): Payer: BC Managed Care – PPO | Admitting: General Surgery

## 2013-10-13 VITALS — BP 124/82 | HR 72 | Temp 97.6°F | Resp 14 | Ht 64.0 in | Wt 277.6 lb

## 2013-10-13 DIAGNOSIS — K605 Anorectal fistula: Secondary | ICD-10-CM

## 2013-10-13 DIAGNOSIS — K603 Anal fistula: Secondary | ICD-10-CM

## 2013-10-13 NOTE — Patient Instructions (Signed)
Return to the office in 6 weeks

## 2013-10-13 NOTE — Progress Notes (Signed)
Stacey Castaneda is a 43 y.o. female who is status post a seton placement on 5/1.  She is doing well.  Her drainage in decreasing.  Her pain is minimal.  She is having regular BM's.  Objective: Filed Vitals:   10/13/13 1539  BP: 124/82  Pulse: 72  Temp: 97.6 F (36.4 C)  Resp: 14    General appearance: alert GI: normal findings: soft, non-tender  Incision: healing well   Assessment: s/p  Patient Active Problem List   Diagnosis Date Noted  . 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 Perirectal abscess 10/30/2012  . 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 Obesity, Class III, BMI 40-49.9 (morbid obesity) 02/07/2012  . Hirsutism 11/29/2010  . ANXIETY 02/22/2010  . DEPRESSION 02/22/2010  . HYPERTENSION 02/22/2010    Plan: Doing well after surgery. Return to the office in 6 weeks for a recheck and discussion about for definitive surgical management in early August.    .Taela Charbonneau C Sherice Ijames, [facility], [place] [phone]   10/13/2013 3:57 PM

## 2013-10-14 ENCOUNTER — Ambulatory Visit (INDEPENDENT_AMBULATORY_CARE_PROVIDER_SITE_OTHER): Payer: BC Managed Care – PPO | Admitting: Family Medicine

## 2013-10-14 ENCOUNTER — Encounter: Payer: Self-pay | Admitting: Family Medicine

## 2013-10-14 VITALS — BP 108/58 | HR 100 | Ht 64.0 in | Wt 278.0 lb

## 2013-10-14 DIAGNOSIS — I10 Essential (primary) hypertension: Secondary | ICD-10-CM

## 2013-10-14 DIAGNOSIS — E042 Nontoxic multinodular goiter: Secondary | ICD-10-CM

## 2013-10-14 DIAGNOSIS — G4710 Hypersomnia, unspecified: Secondary | ICD-10-CM

## 2013-10-14 LAB — COMPLETE METABOLIC PANEL WITH GFR
ALT: 21 U/L (ref 0–35)
AST: 18 U/L (ref 0–37)
Albumin: 4.1 g/dL (ref 3.5–5.2)
Alkaline Phosphatase: 68 U/L (ref 39–117)
BUN: 12 mg/dL (ref 6–23)
CO2: 29 meq/L (ref 19–32)
Calcium: 9.2 mg/dL (ref 8.4–10.5)
Chloride: 101 mEq/L (ref 96–112)
Creat: 0.56 mg/dL (ref 0.50–1.10)
GFR, Est Non African American: >89 mL/min
GLUCOSE: 122 mg/dL — AB (ref 70–99)
Potassium: 4.1 mEq/L (ref 3.5–5.3)
SODIUM: 139 meq/L (ref 135–145)
TOTAL PROTEIN: 6.7 g/dL (ref 6.0–8.3)
Total Bilirubin: 0.4 mg/dL (ref 0.2–1.2)

## 2013-10-14 LAB — HEMOGLOBIN A1C
HEMOGLOBIN A1C: 5.4 % (ref <5.7)
Mean Plasma Glucose: 108 mg/dL (ref <117)

## 2013-10-14 MED ORDER — LOSARTAN POTASSIUM-HCTZ 100-25 MG PO TABS: 1.0000 | ORAL_TABLET | Freq: Every morning | ORAL | Status: DC

## 2013-10-14 MED ORDER — LOSARTAN POTASSIUM-HCTZ 100-12.5 MG PO TABS: 1.0000 | ORAL_TABLET | Freq: Every day | ORAL | Status: DC

## 2013-10-14 NOTE — Progress Notes (Signed)
STOP BANG Questionnaire pt answered yes to 4 items. She is at [facility] risk for OSA. Epworth Sleepiness scall pt is likely to doze off or fall asleep her score was 5 which places her at a high chance of dozing.Stacey Castaneda

## 2013-10-14 NOTE — Progress Notes (Signed)
Spoke with laura @ [employer] and asked that she cancel the losartan hctz 100-25.[name] [place]

## 2013-10-14 NOTE — Progress Notes (Signed)
   Subjective:    Patient ID: Stacey Castaneda, female    DOB: 07-09-1970, 43 y.o.   MRN: [id number]  HPI Hypertension- Pt denies chest pain, SOB, dizziness, or heart palpitations.  Taking meds as directed w/o problems.  Denies medication side effects.    Needs to be screened for sleep apnea.  Was recommended her during her pre-op. They were going to arrange a sleep study but she wanted them to hold off until she had chance to address with me. She denies snoring but does complain of daytime fatigue. She is obese and has a neck circumference greater than 40 cm.  Multinodular goiter-she just wants to make sure that we are keeping a close eye on her thyroid labs. No recent symptoms was skin or hair changes or weight changes. Review of Systems     Objective:   Physical Exam  Constitutional: She is oriented to person, place, and time. She appears well-developed and well-nourished.  HENT:  Head: Normocephalic and atraumatic.  Cardiovascular: Normal rate, regular rhythm and normal heart sounds.   Pulmonary/Chest: Effort normal and breath sounds normal.  Neurological: She is alert and oriented to person, place, and time.  Skin: Skin is warm and dry.  Psychiatric: She has a normal mood and affect. Her behavior is normal.          Assessment & Plan:  HTN- well controlled.  Infact BP is low.  Will dec diuretic component. F/U in 3-4 months for BP check.   Screen for sleep apnea - not paying an Epworth Sleepiness Scale were positive. We'll refer for sleep study for further evaluation. She wants to check with her insurance coverage first and then will contact us.  Multinodular goiter-we'll recheck thyroid labs Lab Results  Component Value Date   TSH 1.120 04/27/2013

## 2013-10-15 LAB — TSH: TSH: 0.902 u[IU]/mL (ref 0.350–4.500)

## 2013-10-15 LAB — T3, FREE: T3, Free: 3.8 pg/mL (ref 2.3–4.2)

## 2013-10-15 LAB — T4, FREE: Free T4: 1.32 ng/dL (ref 0.80–1.80)

## 2013-11-19 ENCOUNTER — Telehealth: Payer: Self-pay

## 2013-11-19 ENCOUNTER — Other Ambulatory Visit: Payer: Self-pay | Admitting: Family Medicine

## 2013-11-19 DIAGNOSIS — R0683 Snoring: Secondary | ICD-10-CM

## 2013-11-19 NOTE — Telephone Encounter (Signed)
Order placed

## 2013-11-19 NOTE — Telephone Encounter (Signed)
Pt called and stated to go ahead and set up the sleep study with whom ever you chose.Marland KitchenEstil Daft

## 2013-11-24 ENCOUNTER — Encounter (INDEPENDENT_AMBULATORY_CARE_PROVIDER_SITE_OTHER): Payer: BC Managed Care – PPO | Admitting: General Surgery

## 2013-12-13 ENCOUNTER — Ambulatory Visit (INDEPENDENT_AMBULATORY_CARE_PROVIDER_SITE_OTHER): Payer: BC Managed Care – PPO | Admitting: General Surgery

## 2013-12-13 VITALS — BP 126/70 | HR 94 | Temp 98.3°F | Ht 64.0 in | Wt 285.0 lb

## 2013-12-13 DIAGNOSIS — K603 Anal fistula, unspecified: Secondary | ICD-10-CM

## 2013-12-13 NOTE — Progress Notes (Signed)
Chief Complaint  Patient presents with  . Follow-up    HISTORY: Stacey Castaneda is a 43 y.o. female who presents to the office with an anal fistula.  She is s/p seton placement 3 months ago.  She has occasional pain and spotting.  She is here today to discuss definitive repair.   Past Medical History  Diagnosis Date  . Anxiety   . Hypertension   . Depression   . Perianal fistula   . PONV (postoperative nausea and vomiting)   . Seasonal allergic rhinitis   . Thyroid goiter     BENIGN  . Wears contact lenses       Past Surgical History  Procedure Laterality Date  . Wisdom tooth extraction  AGE 30  . Incision and drainage perirectal abscess N/A 10/30/2012    Procedure: peri rectal INCISION AND DRAINAGE ABSCESS;  Surgeon: Earnstine Regal, MD;  Location: [facility];  Service: General;  Laterality: N/A;  . Hysteroscopy w/d&c  08-22-2005  . Dilatation of cervix/  removal and replacement mirena  10-22-2010  . Dilation and curettage of uterus  1990's  . Evaluation under anesthesia with anal fistulectomy N/A 09/03/2013    Procedure: EXAM UNDER ANESTHESIA ;  Surgeon: Leighton Ruff, MD;  Location: [facility];  Service: General;  Laterality: N/A;  . Placement of seton N/A 09/03/2013    Procedure: PLACEMENT OF SETON;  Surgeon: Leighton Ruff, MD;  Location: [facility];  Service: General;  Laterality: N/A;        Current Outpatient Prescriptions  Medication Sig Dispense Refill  . cetirizine (ZYRTEC) 10 MG tablet Take 10 mg by mouth daily.      . fluticasone (FLONASE) 50 MCG/ACT nasal spray Place 2 sprays into both nostrils every morning. Use two sprays in each nostril daily      . ibuprofen (ADVIL,MOTRIN) 200 MG tablet Take 800 mg by mouth every 6 (six) hours as needed for mild pain.       Marland Kitchen lidocaine (XYLOCAINE) 5 % ointment       . losartan-hydrochlorothiazide (HYZAAR) 100-12.5 MG per tablet Take 1 tablet by mouth daily.  90 tablet  1  . Multiple Vitamin (MULTIVITAMIN) tablet Take 1 tablet by mouth daily.  2 GUMMIES PER DAY       No current facility-administered medications for this visit.      Allergies  Allergen Reactions  . Lisinopril Cough      Family History  Problem Relation Age of Onset  . Diabetes Father   . Heart disease Father   . Hypertension Father   . Diabetes Mother   . Hypertension Mother   . Cancer Mother     History   Social History  . Marital Status: Married    Spouse Name: N/A    Number of Children: N/A  . Years of Education: N/A   Social History Main Topics  . Smoking status: Former Smoker -- 1.50 packs/day for 10 years    Types: Cigarettes    Quit date: 10/31/2006  . Smokeless tobacco: Never Used  . Alcohol Use: Yes     Comment: RARE  . Drug Use: No  . Sexual Activity: Yes    Birth Control/ Protection: IUD   Other Topics Concern  . Not on file   Social History Narrative  . No narrative on file      REVIEW OF SYSTEMS - PERTINENT POSITIVES ONLY: Review of Systems - General ROS: negative for - chills, fever or weight loss Hematological and Lymphatic  ROS: negative for - bleeding problems, blood clots or bruising Respiratory ROS: no cough, shortness of breath, or wheezing Cardiovascular ROS: no chest pain or dyspnea on exertion Gastrointestinal ROS: no abdominal pain, change in bowel habits, or black or bloody stools Genito-Urinary ROS: no dysuria, trouble voiding, or hematuria  EXAM: Filed Vitals:   12/13/13 1156  BP: 126/70  Pulse: 94  Temp: 98.3 F (36.8 C)    General appearance: alert and cooperative Resp: clear to auscultation bilaterally Cardio: regular rate and rhythm GI: soft, non-tender; bowel sounds normal; no masses,  no organomegaly  Anal Exam Findings: fistula site well-healed, no drainage expressed.  No fluctuant masses    ASSESSMENT AND PLAN:  Stacey Castaneda Is a 43 year old female who is status post seton placement 3 months ago for an anal fistula. She presents today to discuss surgical repair. We talked about  doing a ligation of internal fistula tract. I briefly discussed the other forms of repair but have recommended a lift procedure.  We discussed the risk of recurrence being approximately 20-25%. We discussed the risk of incontinence to be relatively low. We discussed the main other risk of procedure to be bleeding, infection and recurrence requiring additional surgery. She agrees to proceed with the planned procedure. We will get this scheduled at her convenience.   [name], MD Colon and Rectal Surgery / [facility]      Visit Diagnoses: 1. Anal fistula     Primary Care Physician: Beatrice Lecher, MD

## 2013-12-23 ENCOUNTER — Telehealth: Payer: Self-pay | Admitting: *Deleted

## 2013-12-23 DIAGNOSIS — R5381 Other malaise: Secondary | ICD-10-CM

## 2013-12-23 DIAGNOSIS — R5383 Other fatigue: Principal | ICD-10-CM

## 2013-12-23 NOTE — Telephone Encounter (Signed)
Terry from [employer] sleep study called and stated that this pt will need an MD review.  Please give the following ID [id number] to pull up pt's case information. Once review is done Coralyn Mark is asking that we call her back to let her know of the outcome .[name] [place]

## 2013-12-27 ENCOUNTER — Other Ambulatory Visit (INDEPENDENT_AMBULATORY_CARE_PROVIDER_SITE_OTHER): Payer: Self-pay | Admitting: General Surgery

## 2013-12-28 ENCOUNTER — Telehealth: Payer: Self-pay | Admitting: *Deleted

## 2013-12-28 NOTE — Telephone Encounter (Signed)
Called and spoke with provider from [facility]. They will approve a home sleep study based on her symptoms and current medical history. They will fax over a approval and we can get this scheduled.

## 2013-12-28 NOTE — Telephone Encounter (Signed)
Fax approval rec'd from [facility] that Home Sleep Study is approved [id number] valid thru 12/28/13-02/25/14. Margette Fast, CMA

## 2013-12-28 NOTE — Telephone Encounter (Signed)
Spoke w/Terry and informed her that [facility] will approve for her to do a home sleep study she will contact the patient.[name] [place]

## 2014-01-03 ENCOUNTER — Ambulatory Visit (HOSPITAL_BASED_OUTPATIENT_CLINIC_OR_DEPARTMENT_OTHER): Payer: BC Managed Care – PPO | Attending: Family Medicine | Admitting: *Deleted

## 2014-01-03 DIAGNOSIS — R5381 Other malaise: Secondary | ICD-10-CM

## 2014-01-03 DIAGNOSIS — G4733 Obstructive sleep apnea (adult) (pediatric): Secondary | ICD-10-CM | POA: Insufficient documentation

## 2014-01-03 DIAGNOSIS — R5383 Other fatigue: Secondary | ICD-10-CM

## 2014-01-08 DIAGNOSIS — R5381 Other malaise: Secondary | ICD-10-CM

## 2014-01-08 DIAGNOSIS — R5383 Other fatigue: Secondary | ICD-10-CM

## 2014-01-08 NOTE — Sleep Study (Signed)
    NAME: Stacey Castaneda DATE OF BIRTH:  03-12-1971 MEDICAL RECORD NUMBER [id number]  LOCATION: [facility]  PHYSICIAN: YOUNG,CLINTON D  DATE OF STUDY: 01/03/2014  SLEEP STUDY TYPE: Out of Center Sleep Test-unattended                REFERRING PHYSICIAN: Hali Marry, *  INDICATION FOR STUDY: Hypersomnia with sleep apnea  EPWORTH SLEEPINESS SCORE:   6/24 HEIGHT:   5 feet 4 inches WEIGHT:   278 pounds   BMI 47.7 NECK SIZE:   40 cm  MEDICATIONS: Charted for review  IMPRESSION:  Moderate obstructive sleep apnea/hypopneas syndrome, AHI 24.8 per hour. 208 total events scored including 4 central apneas, 39 obstructive apneas, 6 mixed apneas, 159 hypopneas. Most events were while sleeping supine. Snoring with oxygen desaturation to a nadir of 75% and mean oxygen saturation 93% on room air. Mean heart rate 80.7 per minute    RECOMMENDATION:  Scores of this range are usually addressed first with CPAP unless another intervention is more appropriate on an individual basis. A dedicated CPAP titration study can be scheduled through the [facility] if desired.   Deneise Lever Diplomate, American Board of Sleep Medicine  ELECTRONICALLY SIGNED ON:  01/08/2014, 8:52 AM [place] PH: [phone]   FX: [phone] [place]

## 2014-01-11 ENCOUNTER — Other Ambulatory Visit (INDEPENDENT_AMBULATORY_CARE_PROVIDER_SITE_OTHER): Payer: Self-pay | Admitting: General Surgery

## 2014-01-11 DIAGNOSIS — K603 Anal fistula, unspecified: Secondary | ICD-10-CM

## 2014-01-12 ENCOUNTER — Telehealth: Payer: Self-pay | Admitting: Family Medicine

## 2014-01-12 NOTE — Telephone Encounter (Signed)
Call pt: Sleep study shows she does have moderate sleep apnea.  I would recommend in sleep lab CPAP titration study.Once I get those results back will have her schedule to come in to go over everything or we can go over things before CPAP titration if she would like

## 2014-01-13 NOTE — Telephone Encounter (Signed)
Patient advised to call [facility] at [phone] to schedule an appointment.

## 2014-01-18 ENCOUNTER — Ambulatory Visit: Payer: BC Managed Care – PPO | Admitting: Family Medicine

## 2014-01-19 ENCOUNTER — Encounter (HOSPITAL_BASED_OUTPATIENT_CLINIC_OR_DEPARTMENT_OTHER): Payer: BC Managed Care – PPO

## 2014-01-19 ENCOUNTER — Encounter (HOSPITAL_COMMUNITY): Payer: Self-pay | Admitting: Pharmacy Technician

## 2014-01-24 ENCOUNTER — Ambulatory Visit (HOSPITAL_COMMUNITY)
Admission: RE | Admit: 2014-01-24 | Discharge: 2014-01-24 | Disposition: A | Discharge status: Home / Self Care | Payer: BC Managed Care – PPO | Source: Ambulatory Visit | Attending: General Surgery | Admitting: General Surgery

## 2014-01-24 ENCOUNTER — Encounter: Payer: Self-pay | Admitting: Family Medicine

## 2014-01-24 ENCOUNTER — Ambulatory Visit (HOSPITAL_COMMUNITY): Admission: RE | Admit: 2014-01-24 | Payer: BC Managed Care – PPO | Source: Ambulatory Visit

## 2014-01-24 ENCOUNTER — Ambulatory Visit (INDEPENDENT_AMBULATORY_CARE_PROVIDER_SITE_OTHER): Payer: BC Managed Care – PPO | Admitting: Family Medicine

## 2014-01-24 VITALS — BP 134/89 | Temp 99.2°F | Ht 64.0 in | Wt 281.0 lb

## 2014-01-24 DIAGNOSIS — I10 Essential (primary) hypertension: Secondary | ICD-10-CM

## 2014-01-24 DIAGNOSIS — G4733 Obstructive sleep apnea (adult) (pediatric): Secondary | ICD-10-CM | POA: Diagnosis not present

## 2014-01-24 DIAGNOSIS — K603 Anal fistula: Secondary | ICD-10-CM

## 2014-01-24 NOTE — Progress Notes (Signed)
   Subjective:    Patient ID: Stacey Castaneda, female    DOB: Oct 21, 1970, 43 y.o.   MRN: [id number]  HPI Hypertension- Pt denies chest pain, SOB, dizziness, or heart palpitations.  Taking meds as directed w/o problems.  Denies medication side effects.  She has lost 4lb using [employer] pal.    Has surgery scheduled on Friday for fistula repair. They are putting in  PICC line on Thrusday.    Sleep apena - she says she was contacted to schedule her CPAP titration study the care try to verify with her insurance, whether not it can be done in the lab. Review of Systems     Objective:   Physical Exam  Constitutional: She is oriented to person, place, and time. She appears well-developed and well-nourished.  HENT:  Head: Normocephalic and atraumatic.  Cardiovascular: Normal rate, regular rhythm and normal heart sounds.   Pulmonary/Chest: Effort normal and breath sounds normal.  Neurological: She is alert and oriented to person, place, and time.  Skin: Skin is warm and dry.  Psychiatric: She has a normal mood and affect. Her behavior is normal.       Assessment & Plan:  Hypertension-well-controlled though a little boy borderline elevated diastolic today. I encouraged her to come back in one week to recheck her pressure which is less stressed and after surgery. If she's unable to make it because of her surgery then recommend followup in one month for nurse blood pressure check.  Obstructive sleep apnea-if doesn't hear from sleep lab this weeek, then call us back and will work on getting her shceduled.

## 2014-01-26 ENCOUNTER — Encounter (HOSPITAL_COMMUNITY): Payer: Self-pay

## 2014-01-26 ENCOUNTER — Encounter (HOSPITAL_COMMUNITY)
Admission: RE | Admit: 2014-01-26 | Discharge: 2014-01-26 | Disposition: A | Discharge status: Home / Self Care | Payer: BC Managed Care – PPO | Source: Ambulatory Visit | Attending: General Surgery | Admitting: General Surgery

## 2014-01-26 DIAGNOSIS — Z452 Encounter for adjustment and management of vascular access device: Secondary | ICD-10-CM | POA: Diagnosis present

## 2014-01-26 DIAGNOSIS — K603 Anal fistula: Secondary | ICD-10-CM | POA: Diagnosis not present

## 2014-01-26 HISTORY — DX: Other specified disorders of veins: I87.8

## 2014-01-26 HISTORY — DX: Other vitreous opacities, left eye: H43.392

## 2014-01-26 LAB — BASIC METABOLIC PANEL
Anion gap: 14 (ref 5–15)
BUN: 11 mg/dL (ref 6–23)
CO2: 25 mEq/L (ref 19–32)
Calcium: 9.3 mg/dL (ref 8.4–10.5)
Chloride: 99 mEq/L (ref 96–112)
Creatinine, Ser: 0.69 mg/dL (ref 0.50–1.10)
Glucose, Bld: 130 mg/dL — ABNORMAL HIGH (ref 70–99)
POTASSIUM: 3.5 meq/L — AB (ref 3.7–5.3)
SODIUM: 138 meq/L (ref 137–147)

## 2014-01-26 LAB — CBC
HCT: 41 % (ref 36.0–46.0)
Hemoglobin: 14.1 g/dL (ref 12.0–15.0)
MCH: 29.4 pg (ref 26.0–34.0)
MCHC: 34.4 g/dL (ref 30.0–36.0)
MCV: 85.4 fL (ref 78.0–100.0)
PLATELETS: 281 10*3/uL (ref 150–400)
RBC: 4.8 MIL/uL (ref 3.87–5.11)
RDW: 12.7 % (ref 11.5–15.5)
WBC: 8.9 10*3/uL (ref 4.0–10.5)

## 2014-01-26 LAB — HCG, SERUM, QUALITATIVE: PREG SERUM: NEGATIVE

## 2014-01-26 NOTE — Pre-Procedure Instructions (Signed)
01-26-14 1500 Patient having" PICC line" Placement in interventional Radiology 01-27-14 due to difficult IV access. 01-26-14 EKG/ CXR 4'15 Epic.

## 2014-01-26 NOTE — Patient Instructions (Addendum)
Stacey Castaneda  01/26/2014   Your procedure is scheduled on:   01-28-2014 Friday  Enter through [facility] Entrance and follow signs to [facility]. Arrive at       1000 AM..  Call this number if you have problems the morning of surgery: [phone]  Or Presurgical Testing [phone].   For Living Will and/or Health Care Power Attorney Forms: please provide copy for your medical record,may bring AM of surgery(Forms should be already notarized -we do not provide this service).(01-26-14 No,  information was provided today).  Remember: Follow any bowel prep instructions per MD office.    Do not eat food/ or drink: After Midnight.      Take these medicines the morning of surgery with A SIP OF WATER: Zyrtec. Use Flonase spray.   Do not wear jewelry, make-up or nail polish.  Do not wear lotions, powders, or perfumes. You may not  wear deodorant.  Do not shave 48 hours(2 days) prior to first CHG shower(legs and under arms).(Shaving face and neck okay.)  Do not bring valuables to the hospital.(Hospital is not responsible for lost valuables).  Contacts, dentures or removable bridgework, body piercing, hair pins may not be worn into surgery.  Leave suitcase in the car. After surgery it may be brought to your room.  For patients admitted to the hospital, checkout time is 11:00 AM the day of discharge.(Restricted visitors-Any Persons displaying flu-like symptoms or illness).    Patients discharged the day of surgery will not be allowed to drive home. Must have responsible person with you x 24 hours once discharged.  Name and phone number of your driver: Stacey Castaneda, spouse [phone] cell  Special Instructions: CHG(Chlorhedine 4%-"Hibiclens","Betasept","Aplicare") Shower Use Special Wash: see special instructions.(avoid face and genitals)     ______________________________    [facility] - Preparing for Surgery Before surgery, you can play an important role.  Because  skin is not sterile, your skin needs to be as free of germs as possible.  You can reduce the number of germs on your skin by washing with CHG (chlorahexidine gluconate) soap before surgery.  CHG is an antiseptic cleaner which kills germs and bonds with the skin to continue killing germs even after washing. Please DO NOT use if you have an allergy to CHG or antibacterial soaps.  If your skin becomes reddened/irritated stop using the CHG and inform your nurse when you arrive at Short Stay. Do not shave (including legs and underarms) for at least 48 hours prior to the first CHG shower.  You may shave your face/neck. Please follow these instructions carefully:  1.  Shower with CHG Soap the night before surgery and the  morning of Surgery.  2.  If you choose to wash your hair, wash your hair first as usual with your  normal  shampoo.  3.  After you shampoo, rinse your hair and body thoroughly to remove the  shampoo.                           4.  Use CHG as you would any other liquid soap.  You can apply chg directly  to the skin and wash                       Gently with a scrungie or clean washcloth.  5.  Apply the CHG Soap to your body ONLY FROM THE NECK DOWN.  Do not use on face/ open                           Wound or open sores. Avoid contact with eyes, ears mouth and genitals (private parts).                       Wash face,  Genitals (private parts) with your normal soap.             6.  Wash thoroughly, paying special attention to the area where your surgery  will be performed.  7.  Thoroughly rinse your body with warm water from the neck down.  8.  DO NOT shower/wash with your normal soap after using and rinsing off  the CHG Soap.                9.  Pat yourself dry with a clean towel.            10.  Wear clean pajamas.            11.  Place clean sheets on your bed the night of your first shower and do not  sleep with pets. Day of Surgery : Do not apply any lotions/deodorants the morning of  surgery.  Please wear clean clothes to the hospital/surgery center.  FAILURE TO FOLLOW THESE INSTRUCTIONS MAY RESULT IN THE CANCELLATION OF YOUR SURGERY PATIENT SIGNATURE_________________________________  NURSE SIGNATURE__________________________________  ________________________________________________________________________

## 2014-01-27 ENCOUNTER — Other Ambulatory Visit (INDEPENDENT_AMBULATORY_CARE_PROVIDER_SITE_OTHER): Payer: Self-pay | Admitting: General Surgery

## 2014-01-27 ENCOUNTER — Ambulatory Visit (HOSPITAL_COMMUNITY)
Admission: RE | Admit: 2014-01-27 | Discharge: 2014-01-27 | Disposition: A | Discharge status: Home / Self Care | Payer: BC Managed Care – PPO | Source: Ambulatory Visit | Attending: General Surgery | Admitting: General Surgery

## 2014-01-27 DIAGNOSIS — K603 Anal fistula, unspecified: Secondary | ICD-10-CM | POA: Insufficient documentation

## 2014-01-27 DIAGNOSIS — Z452 Encounter for adjustment and management of vascular access device: Secondary | ICD-10-CM | POA: Insufficient documentation

## 2014-01-27 MED ORDER — HEPARIN SOD (PORK) LOCK FLUSH 100 UNIT/ML IV SOLN
INTRAVENOUS | Status: AC
  Filled 2014-01-27: qty 5

## 2014-01-27 MED ORDER — LIDOCAINE HCL 1 % IJ SOLN
INTRAMUSCULAR | Status: AC
  Filled 2014-01-27: qty 20

## 2014-01-27 NOTE — Procedures (Signed)
Successful LUE DL POWER PICC TIP SVC/RA NO COMP STABLE READY FOR USE  

## 2014-01-27 NOTE — Discharge Instructions (Signed)
PICC Home Guide A peripherally inserted central catheter (PICC) is a long, thin, flexible tube that is inserted into a vein in the upper arm. It is a form of intravenous (IV) access. It is considered to be a "central" line because the tip of the PICC ends in a large vein in your chest. This large vein is called the superior vena cava (SVC). The PICC tip ends in the SVC because there is a lot of blood flow in the SVC. This allows medicines and IV fluids to be quickly distributed throughout the body. The PICC is inserted using a sterile technique by a specially trained nurse or physician. After the PICC is inserted, a chest X-ray exam is done to be sure it is in the correct place.  A PICC may be placed for different reasons, such as:  To give medicines and liquid nutrition that can only be given through a central line. Examples are:  Certain antibiotic treatments.  Chemotherapy.  Total parenteral nutrition (TPN).  To take frequent blood samples.  To give IV fluids and blood products.  If there is difficulty placing a peripheral intravenous (PIV) catheter. If taken care of properly, a PICC can remain in place for several months. A PICC can also allow a person to go home from the hospital early. Medicine and PICC care can be managed at home by a family member or home health care team. WHAT PROBLEMS CAN HAPPEN WHEN I HAVE A PICC? Problems with a PICC can occasionally occur. These may include the following:  A blood clot (thrombus) forming in or at the tip of the PICC. This can cause the PICC to become clogged. A clot-dissolving medicine called tissue plasminogen activator (tPA) can be given through the PICC to help break up the clot.  Inflammation of the vein (phlebitis) in which the PICC is placed. Signs of inflammation may include redness, pain at the insertion site, red streaks, or being able to feel a "cord" in the vein where the PICC is located.  Infection in the PICC or at the insertion  site. Signs of infection may include fever, chills, redness, swelling, or pus drainage from the PICC insertion site.  PICC movement (malposition). The PICC tip may move from its original position due to excessive physical activity, forceful coughing, sneezing, or vomiting.  A break or cut in the PICC. It is important to not use scissors near the PICC.  Nerve or tendon irritation or injury during PICC insertion. WHAT SHOULD I KEEP IN MIND ABOUT ACTIVITIES WHEN I HAVE A PICC?  You may bend your arm and move it freely. If your PICC is near or at the bend of your elbow, avoid activity with repeated motion at the elbow.  Rest at home for the remainder of the day following PICC line insertion.  Avoid lifting heavy objects as instructed by your health care provider.  Avoid using a crutch with the arm on the same side as your PICC. You may need to use a walker. WHAT SHOULD I KNOW ABOUT MY PICC DRESSING?  Keep your PICC bandage (dressing) clean and dry to prevent infection.  Ask your health care provider when you may shower. Ask your health care provider to teach you how to wrap the PICC when you do take a shower.  Change the PICC dressing as instructed by your health care provider.  Change your PICC dressing if it becomes loose or wet. WHAT SHOULD I KNOW ABOUT PICC CARE?  Check the PICC insertion site   daily for leakage, redness, swelling, or pain.  Do not take a bath, swim, or use hot tubs when you have a PICC. Cover PICC line with clear plastic wrap and tape to keep it dry while showering.  Flush the PICC as directed by your health care provider. Let your health care provider know right away if the PICC is difficult to flush or does not flush. Do not use force to flush the PICC.  Do not use a syringe that is less than 10 mL to flush the PICC.  Never pull or tug on the PICC.  Avoid blood pressure checks on the arm with the PICC.  Keep your PICC identification card with you at all  times.  Do not take the PICC out yourself. Only a trained clinical professional should remove the PICC. SEEK IMMEDIATE MEDICAL CARE IF:  Your PICC is accidentally pulled all the way out. If this happens, cover the insertion site with a bandage or gauze dressing. Do not throw the PICC away. Your health care provider will need to inspect it.  Your PICC was tugged or pulled and has partially come out. Do not  push the PICC back in.  There is any type of drainage, redness, or swelling where the PICC enters the skin.  You cannot flush the PICC, it is difficult to flush, or the PICC leaks around the insertion site when it is flushed.  You hear a "flushing" sound when the PICC is flushed.  You have pain, discomfort, or numbness in your arm, shoulder, or jaw on the same side as the PICC.  You feel your heart "racing" or skipping beats.  You notice a hole or tear in the PICC.  You develop chills or a fever. MAKE SURE YOU:   Understand these instructions.  Will watch your condition.  Will get help right away if you are not doing well or get worse. Document Released: 10/27/2002 Document Revised: 09/06/2013 Document Reviewed: 12/28/2012 ExitCare Patient Information 2015 [employer]. This information is not intended to replace advice given to you by your health care provider. Make sure you discuss any questions you have with your health care provider.  

## 2014-01-28 ENCOUNTER — Ambulatory Visit (HOSPITAL_COMMUNITY): Payer: BC Managed Care – PPO | Admitting: Registered Nurse

## 2014-01-28 ENCOUNTER — Encounter (HOSPITAL_COMMUNITY): Payer: Self-pay

## 2014-01-28 ENCOUNTER — Encounter (HOSPITAL_COMMUNITY)
Admission: RE | Disposition: A | Discharge status: Home / Self Care | Payer: Self-pay | Source: Ambulatory Visit | Attending: General Surgery

## 2014-01-28 ENCOUNTER — Encounter (HOSPITAL_COMMUNITY): Payer: BC Managed Care – PPO | Admitting: Registered Nurse

## 2014-01-28 ENCOUNTER — Ambulatory Visit (HOSPITAL_COMMUNITY)
Admission: RE | Admit: 2014-01-28 | Discharge: 2014-01-28 | Disposition: A | Discharge status: Home / Self Care | Payer: BC Managed Care – PPO | Source: Ambulatory Visit | Attending: General Surgery | Admitting: General Surgery

## 2014-01-28 DIAGNOSIS — Z79899 Other long term (current) drug therapy: Secondary | ICD-10-CM | POA: Insufficient documentation

## 2014-01-28 DIAGNOSIS — K603 Anal fistula, unspecified: Secondary | ICD-10-CM | POA: Insufficient documentation

## 2014-01-28 DIAGNOSIS — F329 Major depressive disorder, single episode, unspecified: Secondary | ICD-10-CM | POA: Insufficient documentation

## 2014-01-28 DIAGNOSIS — Z87891 Personal history of nicotine dependence: Secondary | ICD-10-CM | POA: Insufficient documentation

## 2014-01-28 DIAGNOSIS — F411 Generalized anxiety disorder: Secondary | ICD-10-CM | POA: Insufficient documentation

## 2014-01-28 DIAGNOSIS — I10 Essential (primary) hypertension: Secondary | ICD-10-CM | POA: Diagnosis not present

## 2014-01-28 DIAGNOSIS — F3289 Other specified depressive episodes: Secondary | ICD-10-CM | POA: Insufficient documentation

## 2014-01-28 HISTORY — PX: ANAL FISTULECTOMY: SHX1139

## 2014-01-28 SURGERY — FISTULECTOMY, ANAL
Anesthesia: General | Site: Rectum

## 2014-01-28 MED ORDER — NEOSTIGMINE METHYLSULFATE 10 MG/10ML IV SOLN
INTRAVENOUS | Status: DC | PRN
  Administered 2014-01-28: 5 mg via INTRAVENOUS

## 2014-01-28 MED ORDER — OXYCODONE HCL 5 MG PO TABS: 5.0000 mg | ORAL_TABLET | ORAL | Status: DC | PRN

## 2014-01-28 MED ORDER — LACTATED RINGERS IV SOLN
INTRAVENOUS | Status: DC
  Administered 2014-01-28: 1000 mL via INTRAVENOUS
  Administered 2014-01-28: 15:00:00 via INTRAVENOUS

## 2014-01-28 MED ORDER — SUCCINYLCHOLINE CHLORIDE 20 MG/ML IJ SOLN
INTRAMUSCULAR | Status: DC | PRN
  Administered 2014-01-28: 120 mg via INTRAVENOUS

## 2014-01-28 MED ORDER — ROCURONIUM BROMIDE 100 MG/10ML IV SOLN
INTRAVENOUS | Status: AC
  Filled 2014-01-28: qty 1

## 2014-01-28 MED ORDER — OXYCODONE HCL 5 MG PO TABS
5.0000 mg | ORAL_TABLET | ORAL | Status: DC | PRN
  Filled 2014-01-28: qty 1

## 2014-01-28 MED ORDER — FENTANYL CITRATE 0.05 MG/ML IJ SOLN
INTRAMUSCULAR | Status: DC | PRN
  Administered 2014-01-28: 50 ug via INTRAVENOUS
  Administered 2014-01-28 (×2): 100 ug via INTRAVENOUS

## 2014-01-28 MED ORDER — OXYCODONE HCL 5 MG PO TABS
5.0000 mg | ORAL_TABLET | ORAL | Status: DC | PRN
  Administered 2014-01-28: 5 mg via ORAL

## 2014-01-28 MED ORDER — MIDAZOLAM HCL 5 MG/5ML IJ SOLN
INTRAMUSCULAR | Status: DC | PRN
  Administered 2014-01-28 (×2): 1 mg via INTRAVENOUS

## 2014-01-28 MED ORDER — SODIUM CHLORIDE 0.9 % IJ SOLN: 3.0000 mL | INTRAMUSCULAR | Status: DC | PRN

## 2014-01-28 MED ORDER — ROCURONIUM BROMIDE 100 MG/10ML IV SOLN
INTRAVENOUS | Status: DC | PRN
  Administered 2014-01-28: 30 mg via INTRAVENOUS

## 2014-01-28 MED ORDER — FENTANYL CITRATE 0.05 MG/ML IJ SOLN
INTRAMUSCULAR | Status: AC
  Filled 2014-01-28: qty 5

## 2014-01-28 MED ORDER — LIDOCAINE HCL (CARDIAC) 20 MG/ML IV SOLN
INTRAVENOUS | Status: DC | PRN
  Administered 2014-01-28: 100 mg via INTRAVENOUS

## 2014-01-28 MED ORDER — BUPIVACAINE-EPINEPHRINE (PF) 0.25% -1:200000 IJ SOLN
INTRAMUSCULAR | Status: AC
  Filled 2014-01-28: qty 30

## 2014-01-28 MED ORDER — DEXAMETHASONE SODIUM PHOSPHATE 10 MG/ML IJ SOLN
INTRAMUSCULAR | Status: AC
  Filled 2014-01-28: qty 1

## 2014-01-28 MED ORDER — PROMETHAZINE HCL 25 MG/ML IJ SOLN
INTRAMUSCULAR | Status: AC
  Filled 2014-01-28: qty 1

## 2014-01-28 MED ORDER — PROPOFOL 10 MG/ML IV BOLUS
INTRAVENOUS | Status: AC
  Filled 2014-01-28: qty 20

## 2014-01-28 MED ORDER — LIDOCAINE HCL (CARDIAC) 20 MG/ML IV SOLN
INTRAVENOUS | Status: AC
  Filled 2014-01-28: qty 5

## 2014-01-28 MED ORDER — BUPIVACAINE-EPINEPHRINE (PF) 0.5% -1:200000 IJ SOLN
INTRAMUSCULAR | Status: DC | PRN
  Administered 2014-01-28: 20 mL

## 2014-01-28 MED ORDER — ACETAMINOPHEN 650 MG RE SUPP
650.0000 mg | RECTAL | Status: DC | PRN
  Filled 2014-01-28: qty 1

## 2014-01-28 MED ORDER — HYDROGEN PEROXIDE 3 % EX SOLN
CUTANEOUS | Status: AC
  Filled 2014-01-28: qty 473

## 2014-01-28 MED ORDER — PROPOFOL 10 MG/ML IV BOLUS
INTRAVENOUS | Status: DC | PRN
  Administered 2014-01-28: 250 mg via INTRAVENOUS

## 2014-01-28 MED ORDER — MIDAZOLAM HCL 2 MG/2ML IJ SOLN
INTRAMUSCULAR | Status: AC
  Filled 2014-01-28: qty 2

## 2014-01-28 MED ORDER — SODIUM CHLORIDE 0.9 % IV SOLN: 250.0000 mL | INTRAVENOUS | Status: DC | PRN

## 2014-01-28 MED ORDER — GLYCOPYRROLATE 0.2 MG/ML IJ SOLN
INTRAMUSCULAR | Status: AC
  Filled 2014-01-28: qty 4

## 2014-01-28 MED ORDER — HYDROGEN PEROXIDE 3 % EX SOLN
CUTANEOUS | Status: DC | PRN
  Administered 2014-01-28: 1

## 2014-01-28 MED ORDER — ONDANSETRON HCL 4 MG/2ML IJ SOLN
INTRAMUSCULAR | Status: DC | PRN
  Administered 2014-01-28: 4 mg via INTRAVENOUS

## 2014-01-28 MED ORDER — ACETAMINOPHEN 325 MG PO TABS: 650.0000 mg | ORAL_TABLET | ORAL | Status: DC | PRN

## 2014-01-28 MED ORDER — SODIUM CHLORIDE 0.9 % IJ SOLN: 3.0000 mL | Freq: Two times a day (BID) | INTRAMUSCULAR | Status: DC

## 2014-01-28 MED ORDER — ONDANSETRON HCL 4 MG/2ML IJ SOLN
INTRAMUSCULAR | Status: AC
  Filled 2014-01-28: qty 2

## 2014-01-28 MED ORDER — DEXAMETHASONE SODIUM PHOSPHATE 10 MG/ML IJ SOLN
INTRAMUSCULAR | Status: DC | PRN
  Administered 2014-01-28: 10 mg via INTRAVENOUS

## 2014-01-28 MED ORDER — BUPIVACAINE-EPINEPHRINE (PF) 0.5% -1:200000 IJ SOLN
INTRAMUSCULAR | Status: AC
  Filled 2014-01-28: qty 30

## 2014-01-28 MED ORDER — GLYCOPYRROLATE 0.2 MG/ML IJ SOLN
INTRAMUSCULAR | Status: DC | PRN
  Administered 2014-01-28: .8 mg via INTRAVENOUS

## 2014-01-28 MED ORDER — PROMETHAZINE HCL 25 MG/ML IJ SOLN
6.2500 mg | INTRAMUSCULAR | Status: DC | PRN
  Administered 2014-01-28: 12.5 mg via INTRAVENOUS

## 2014-01-28 SURGICAL SUPPLY — 39 items
BLADE HEX COATED 2.75 (ELECTRODE) ×2 IMPLANT
BLADE SURG 15 STRL LF DISP TIS (BLADE) ×1 IMPLANT
BLADE SURG 15 STRL SS (BLADE) ×2
CANISTER SUCTION 2500CC (MISCELLANEOUS) ×2 IMPLANT
DECANTER SPIKE VIAL GLASS SM (MISCELLANEOUS) ×2 IMPLANT
DRSG PAD ABDOMINAL 8X10 ST (GAUZE/BANDAGES/DRESSINGS) IMPLANT
ELECT REM PT RETURN 9FT ADLT (ELECTROSURGICAL) ×2
ELECTRODE REM PT RTRN 9FT ADLT (ELECTROSURGICAL) ×1 IMPLANT
GAUZE SPONGE 4X4 12PLY STRL (GAUZE/BANDAGES/DRESSINGS) ×1 IMPLANT
GAUZE SPONGE 4X4 16PLY XRAY LF (GAUZE/BANDAGES/DRESSINGS) ×2 IMPLANT
GLOVE BIO SURGEON STRL SZ 6.5 (GLOVE) ×2 IMPLANT
GLOVE BIOGEL PI IND STRL 7.0 (GLOVE) ×1 IMPLANT
GLOVE BIOGEL PI INDICATOR 7.0 (GLOVE) ×1
GOWN L4 XXLG W/PAP TWL (GOWN DISPOSABLE) ×2 IMPLANT
GOWN SPEC L4 XLG W/TWL (GOWN DISPOSABLE) ×4 IMPLANT
GOWN STRL REUS W/TWL LRG LVL3 (GOWN DISPOSABLE) ×2 IMPLANT
IV CATH 14GX2 1/4 (CATHETERS) ×1 IMPLANT
KIT BASIN OR (CUSTOM PROCEDURE TRAY) ×2 IMPLANT
LUBRICANT JELLY K Y 4OZ (MISCELLANEOUS) ×2 IMPLANT
NDL HYPO 25X1 1.5 SAFETY (NEEDLE) ×1 IMPLANT
NEEDLE HYPO 25X1 1.5 SAFETY (NEEDLE) ×2 IMPLANT
NS IRRIG 1000ML POUR BTL (IV SOLUTION) ×2 IMPLANT
PACK BASIC VI WITH GOWN DISP (CUSTOM PROCEDURE TRAY) ×2 IMPLANT
PAD ABD 8X10 STRL (GAUZE/BANDAGES/DRESSINGS) ×1 IMPLANT
PENCIL BUTTON HOLSTER BLD 10FT (ELECTRODE) ×2 IMPLANT
RETRACTOR LONE STAR DISPOSABLE (INSTRUMENTS) ×1 IMPLANT
RETRACTOR STAY HOOK 5MM (MISCELLANEOUS) ×1 IMPLANT
SPONGE SURGIFOAM ABS GEL 12-7 (HEMOSTASIS) ×2 IMPLANT
SUT CHROMIC 2 0 SH (SUTURE) ×2 IMPLANT
SUT CHROMIC 3 0 SH 27 (SUTURE) IMPLANT
SUT MON AB 3-0 SH 27 (SUTURE) ×4
SUT MON AB 3-0 SH27 (SUTURE) ×2 IMPLANT
SUT VIC AB 2-0 SH 18 (SUTURE) ×1 IMPLANT
SUT VIC AB 4-0 P-3 18XBRD (SUTURE) IMPLANT
SUT VIC AB 4-0 P3 18 (SUTURE)
SYR CONTROL 10ML LL (SYRINGE) ×2 IMPLANT
SYRINGE 20CC LL (MISCELLANEOUS) ×1 IMPLANT
TOWEL OR 17X26 10 PK STRL BLUE (TOWEL DISPOSABLE) ×2 IMPLANT
YANKAUER SUCT BULB TIP 10FT TU (MISCELLANEOUS) ×2 IMPLANT

## 2014-01-28 NOTE — Anesthesia Preprocedure Evaluation (Addendum)
Anesthesia Evaluation  Patient identified by MRN, date of birth, ID band Patient awake    Reviewed: Allergy & Precautions, H&P , NPO status , Patient's Chart, lab work & pertinent test results  History of Anesthesia Complications (+) PONV  Airway Mallampati: II TM Distance: >3 FB Neck ROM: Full    Dental no notable dental hx. (+) Teeth Intact, Dental Advisory Given   Pulmonary neg pulmonary ROS, former smoker,  breath sounds clear to auscultation  Pulmonary exam normal       Cardiovascular hypertension, Pt. on medications Rhythm:Regular Rate:Normal     Neuro/Psych Anxiety Depression negative neurological ROS  negative psych ROS   GI/Hepatic negative GI ROS, Neg liver ROS,   Endo/Other  Morbid obesityGoiter - tests negative  Renal/GU negative Renal ROS  negative genitourinary   Musculoskeletal negative musculoskeletal ROS (+)   Abdominal (+) + obese,   Peds negative pediatric ROS (+)  Hematology negative hematology ROS (+)   Anesthesia Other Findings   Reproductive/Obstetrics negative OB ROS                          Anesthesia Physical Anesthesia Plan  ASA: II  Anesthesia Plan: General   Post-op Pain Management:    Induction: Intravenous  Airway Management Planned: Oral ETT  Additional Equipment:   Intra-op Plan:   Post-operative Plan: Extubation in OR  Informed Consent: I have reviewed the patients History and Physical, chart, labs and discussed the procedure including the risks, benefits and alternatives for the proposed anesthesia with the patient or authorized representative who has indicated his/her understanding and acceptance.   Dental Advisory Given  Plan Discussed with: CRNA and Surgeon  Anesthesia Plan Comments:         Anesthesia Quick Evaluation

## 2014-01-28 NOTE — Discharge Instructions (Signed)
ANORECTAL SURGERY: POST OP INSTRUCTIONS °1. Take your usually prescribed home medications unless otherwise directed. °2. DIET: During the first few hours after surgery sip on some liquids until you are able to urinate.  It is normal to not urinate for several hours after this surgery.  If you feel uncomfortable, please contact the office for instructions.  After you are able to urinate,you may eat, if you feel like it.  Follow a light bland diet the first 24 hours after arrival home, such as soup, liquids, crackers, etc.  Be sure to include lots of fluids daily (6-8 glasses).  Avoid fast food or heavy meals, as your are more likely to get nauseated.  Eat a low fat diet the next few days after surgery.  Limit caffeine intake to 1-2 servings a day. °3. PAIN CONTROL: °a. Pain is best controlled by a usual combination of several different methods TOGETHER: °i. Muscle relaxation: Soak in a warm bath (or Sitz bath) three times a day and after bowel movements.  Continue to do this until all pain is resolved. °ii. Over the counter pain medication °iii. Prescription pain medication °b. Most patients will experience some swelling and discomfort in the anus/rectal area and incisions.  Heat such as warm towels, sitz baths, warm baths, etc to help relax tight/sore spots and speed recovery.  Some people prefer to use ice, especially in the first couple days after surgery, as it may decrease the pain and swelling, or alternate between ice & heat.  Experiment to what works for you.  Swelling and bruising can take several weeks to resolve.  Pain can take even longer to completely resolve. °c. It is helpful to take an over-the-counter pain medication regularly for the first few weeks.  Choose one of the following that works best for you: °i. Naproxen (Aleve, etc)  Two 220mg tabs twice a day °ii. Ibuprofen (Advil, etc) Three 200mg tabs four times a day (every meal & bedtime) °d. A  prescription for pain medication (such as percocet,  oxycodone, hydrocodone, etc) should be given to you upon discharge.  Take your pain medication as prescribed.  °i. If you are having problems/concerns with the prescription medicine (does not control pain, nausea, vomiting, rash, itching, etc), please call us [phone] to see if we need to switch you to a different pain medicine that will work better for you and/or control your side effect better. °ii. If you need a refill on your pain medication, please contact your pharmacy.  They will contact our office to request authorization. Prescriptions will not be filled after 5 pm or on week-ends. °4. KEEP YOUR BOWELS REGULAR and AVOID CONSTIPATION °a. The goal is one to two soft bowel movements a day.  You should at least have a bowel movement every other day. °b. Avoid getting constipated.  Between the surgery and the pain medications, it is common to experience some constipation. This can be very painful after rectal surgery.  Increasing fluid intake and taking a fiber supplement (such as Metamucil, Citrucel, FiberCon, etc) 1-2 times a day regularly will usually help prevent this problem from occurring.  A stool softener like colace is also recommended.  This can be purchased over the counter at your pharmacy.  You can take it up to 3 times a day.  If you do not have a bowel movement after 24 hrs since your surgery, take one does of milk of magnesia.  If you still haven't had a bowel movement 8-12 hours after   that dose, take another dose.  If you don't have a bowel movement 48 hrs after surgery, purchase a Fleets enema from the drug store and administer gently per package instructions.  If you still are having trouble with your bowel movements after that, please call the office for further instructions. °c. If you develop diarrhea or have many loose bowel movements, simplify your diet to bland foods & liquids for a few days.  Stop any stool softeners and decrease your fiber supplement.  Switching to mild  anti-diarrheal medications (Kayopectate, Pepto Bismol) can help.  If this worsens or does not improve, please call us. ° °5. Wound Care °a. Remove your bandages before your first bowel movement or 8 hours after surgery.     °b. Remove any wound packing material at this tim,e as well.  You do not need to repack the wound unless instructed otherwise.  Wear an absorbent pad or soft cotton gauze in your underwear to catch any drainage and help keep the area clean. You should change this every 2-3 hours while awake. °c. Keep the area clean and dry.  Bathe / shower every day, especially after bowel movements.  Keep the area clean by showering / bathing over the incision / wound.   It is okay to soak an open wound to help wash it.  Wet wipes or showers / gentle washing after bowel movements is often less traumatic than regular toilet paper. °d. You may have some styrofoam-like soft packing in the rectum which will come out with the first bowel movement.  °e. You will often notice bleeding with bowel movements.  This should slow down by the end of the first week of surgery °f. Expect some drainage.  This should slow down, too, by the end of the first week of surgery.  Wear an absorbent pad or soft cotton gauze in your underwear until the drainage stops. °g. Do Not sit on a rubber or pillow ring.  This can make you symptoms worse.  You may sit on a soft pillow if needed.  °6. ACTIVITIES as tolerated:   °a. You may resume regular (light) daily activities beginning the next day--such as daily self-care, walking, climbing stairs--gradually increasing activities as tolerated.  If you can walk 30 minutes without difficulty, it is safe to try more intense activity such as jogging, treadmill, bicycling, low-impact aerobics, swimming, etc. °b. Save the most intensive and strenuous activity for last such as sit-ups, heavy lifting, contact sports, etc  Refrain from any heavy lifting or straining until you are off narcotics for pain  control.   °c. You may drive when you are no longer taking prescription pain medication, you can comfortably sit for long periods of time, and you can safely maneuver your car and apply brakes. °d. You may have sexual intercourse when it is comfortable.  °7. FOLLOW UP in our office °a. Please call [facility] at [phone] to set up an appointment to see your surgeon in the office for a follow-up appointment approximately 3-4 weeks after your surgery. °b. Make sure that you call for this appointment the day you arrive home to insure a convenient appointment time. °10. IF YOU HAVE DISABILITY OR FAMILY LEAVE FORMS, BRING THEM TO THE OFFICE FOR PROCESSING.  DO NOT GIVE THEM TO YOUR DOCTOR. ° ° ° ° °WHEN TO CALL US [phone]: °1. Poor pain control °2. Reactions / problems with new medications (rash/itching, nausea, etc)  °3. Fever over 101.5 F (38.5 C) °4.   Inability to urinate °5. Nausea and/or vomiting °6. Worsening swelling or bruising °7. Continued bleeding from incision. °8. Increased pain, redness, or drainage from the incision ° °The clinic staff is available to answer your questions during regular business hours (8:30am-5pm).  Please don’t hesitate to call and ask to speak to one of our nurses for clinical concerns.   A surgeon from [facility] is always on call at the hospitals °  °If you have a medical emergency, go to the nearest emergency room or call 911. °  ° °[facility] °[address], [address], [place], [place]  [address] ? °MAIN: [phone] ? TOLL FREE: [phone] ? °FAX [phone] °[email or web address] ° ° ° °

## 2014-01-28 NOTE — Anesthesia Postprocedure Evaluation (Signed)
  Anesthesia Post-op Note  Patient: Stacey Castaneda  Procedure(s) Performed: Procedure(s) (LRB): LIGATION OF INTERNAL FISTULA TRACT, REMOVAL OF SETON  (N/A)  Patient Location: PACU  Anesthesia Type: General  Level of Consciousness: awake and alert   Airway and Oxygen Therapy: Patient Spontanous Breathing  Post-op Pain: mild  Post-op Assessment: Post-op Vital signs reviewed, Patient's Cardiovascular Status Stable, Respiratory Function Stable, Patent Airway and No signs of Nausea or vomiting  Last Vitals:  Filed Vitals:   01/28/14 1445  BP: 149/90  Pulse: 72  Temp:   Resp: 15    Post-op Vital Signs: stable   Complications: No apparent anesthesia complications

## 2014-01-28 NOTE — Transfer of Care (Signed)
Immediate Anesthesia Transfer of Care Note  Patient: Stacey Castaneda  Procedure(s) Performed: Procedure(s): LIGATION OF INTERNAL FISTULA TRACT, REMOVAL OF SETON  (N/A)  Patient Location: PACU  Anesthesia Type:General  Level of Consciousness: awake, alert , oriented and patient cooperative  Airway & Oxygen Therapy: Patient Spontanous Breathing and Patient connected to face mask oxygen  Post-op Assessment: Report given to PACU RN, Post -op Vital signs reviewed and stable and Patient moving all extremities  Post vital signs: Reviewed and stable  Complications: No apparent anesthesia complications

## 2014-01-28 NOTE — Op Note (Signed)
01/28/2014  2:12 PM  PATIENT:  Stacey Castaneda  43 y.o. female  Patient Care Team: Hali Marry, MD as PCP - General  PRE-OPERATIVE DIAGNOSIS:  anal fistula  POST-OPERATIVE DIAGNOSIS:  ANAL FISTULA   PROCEDURE:  LIGATION OF INTERNAL FISTULA TRACT  SURGEON:  Surgeon(s): Leighton Ruff, MD  ASSISTANT: none   ANESTHESIA:   general and local  SPECIMEN:  No Specimen  DISPOSITION OF SPECIMEN:  N/A  COUNTS:  YES  PLAN OF CARE: Discharge to home after PACU  PATIENT DISPOSITION:  PACU - hemodynamically stable.  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 A seton was placed and allowed to drain the wound for 3 months. She is now here today for the second stage of her fistula procedure.   OR FINDINGS: Transsphincteric fistula tract  DESCRIPTION: the patient was identified in the preoperative holding area and taken to the OR where they were laid on the operating room table.  Gen. anesthesia was induced without difficulty. The patient was then positioned in prone jackknife position with buttocks gently taped apart.  The patient was then prepped and draped in usual sterile fashion.  SCDs were noted to be in place prior to the initiation of anesthesia. A surgical timeout was performed indicating the correct patient, procedure, positioning and need for preoperative antibiotics.  I began with a digital rectal exam.  There were no abnormal masses.  I then placed a Hill-Ferguson anoscope into the anal canal and evaluated this completely.    The patient had grade 2 hemorrhoid disease. I then made an incision over the groove between the internal sphincter and external sphincter. This was carried down through subcutaneous tissues using blunt dissection. An S. Shape fistula probe was placed into the fistula tract the seton removed. A [place] retractor was used to gain exposure. I dissected  around the fistula tract using a right angle. I placed 2 silk sutures on either side of the fistula tract. I removed the fistula probe. I tied down its 2-0 silk suture and ligated in between. I then reinforced this with a figure-of-eight 2-0 Vicryl suture on both sides. I then tested the closure using hydrogen peroxide on the external opening site. There was a small leak posteriorly. This was close using an additional 2-0 Vicryl suture. I then created a small mucosal advancement flap over the internal opening and sutured this closed using interrupted 3-0 chromic suture. I then closed the subcutaneous layer of tissue in the original dissection plane using interrupted 2-0 Vicryl suture. 2-0 chromic suture was used to close the skin. The external opening was then enlarged using Bovie electric artery. The wound track was curetted. Hemostasis was achieved. Half percent Marcaine with epinephrine was used to create a rectal block. All counts were correct per operating room staff. The patient was extubated and sent to the post anesthesia care unit in stable condition.

## 2014-01-28 NOTE — H&P (Signed)
HISTORY: Stacey Castaneda is a 43 y.o. female who presents to the office with an anal fistula. She is s/p seton placement 3 months ago. She has occasional pain and spotting. She is here today to discuss definitive repair.  Past Medical History   Diagnosis  Date   .  Anxiety    .  Hypertension    .  Depression    .  Perianal fistula    .  PONV (postoperative nausea and vomiting)    .  Seasonal allergic rhinitis    .  Thyroid goiter      BENIGN   .  Wears contact lenses     Past Surgical History   Procedure  Laterality  Date   .  Wisdom tooth extraction   AGE 55   .  Incision and drainage perirectal abscess  N/A  10/30/2012     Procedure: peri rectal INCISION AND DRAINAGE ABSCESS; Surgeon: Earnstine Regal, MD; Location: [facility]; Service: General; Laterality: N/A;   .  Hysteroscopy w/d&c   08-22-2005   .  Dilatation of cervix/ removal and replacement mirena   10-22-2010   .  Dilation and curettage of uterus   1990's   .  Evaluation under anesthesia with anal fistulectomy  N/A  09/03/2013     Procedure: EXAM UNDER ANESTHESIA ; Surgeon: Leighton Ruff, MD; Location: [facility]; Service: General; Laterality: N/A;   .  Placement of seton  N/A  09/03/2013     Procedure: PLACEMENT OF SETON; Surgeon: Leighton Ruff, MD; Location: [facility]; Service: General; Laterality: N/A;    Current Outpatient Prescriptions   Medication  Sig  Dispense  Refill   .  cetirizine (ZYRTEC) 10 MG tablet  Take 10 mg by mouth daily.     .  fluticasone (FLONASE) 50 MCG/ACT nasal spray  Place 2 sprays into both nostrils every morning. Use two sprays in each nostril daily     .  ibuprofen (ADVIL,MOTRIN) 200 MG tablet  Take 800 mg by mouth every 6 (six) hours as needed for mild pain.     Marland Kitchen  lidocaine (XYLOCAINE) 5 % ointment      .  losartan-hydrochlorothiazide (HYZAAR) 100-12.5 MG per tablet  Take 1 tablet by mouth daily.  90 tablet  1   .  Multiple Vitamin (MULTIVITAMIN) tablet  Take 1 tablet by mouth daily. 2 GUMMIES PER DAY       No current facility-administered medications for this visit.    Allergies   Allergen  Reactions   .  Lisinopril  Cough    Family History   Problem  Relation  Age of Onset   .  Diabetes  Father    .  Heart disease  Father    .  Hypertension  Father    .  Diabetes  Mother    .  Hypertension  Mother    .  Cancer  Mother     History    Social History   .  Marital Status:  Married     Spouse Name:  N/A     Number of Children:  N/A   .  Years of Education:  N/A    Social History Main Topics   .  Smoking status:  Former Smoker -- 1.50 packs/day for 10 years     Types:  Cigarettes     Quit date:  10/31/2006   .  Smokeless tobacco:  Never Used   .  Alcohol Use:  Yes  Comment: RARE   .  Drug Use:  No   .  Sexual Activity:  Yes     Birth Control/ Protection:  IUD    Other Topics  Concern   .  Not on file    Social History Narrative   .  No narrative on file   REVIEW OF SYSTEMS - PERTINENT POSITIVES ONLY:  Review of Systems - General ROS: negative for - chills, fever or weight loss  Hematological and Lymphatic ROS: negative for - bleeding problems, blood clots or bruising  Respiratory ROS: no cough, shortness of breath, or wheezing  Cardiovascular ROS: no chest pain or dyspnea on exertion  Gastrointestinal ROS: no abdominal pain, change in bowel habits, or black or bloody stools  Genito-Urinary ROS: no dysuria, trouble voiding, or hematuria  EXAM:  BP 143/82  Pulse 84  Temp(Src) 98.1 F (36.7 C) (Oral)  Resp 18  Ht 5\' 4"  (1.626 m)  Wt 278 lb (126.1 kg)  BMI 47.70 kg/m2 General appearance: alert and cooperative  Resp: clear to auscultation bilaterally  Cardio: regular rate and rhythm  GI: soft, non-tender; bowel sounds normal; no masses, no organomegaly  Anal Exam Findings: fistula site well-healed, no drainage expressed. No fluctuant masses  ASSESSMENT AND PLAN:  Stacey Castaneda Is a 43 year old female who is status post seton placement 3 months ago for an  anal fistula. She presents today to discuss surgical repair. We talked about doing a ligation of internal fistula tract. I briefly discussed the other forms of repair but have recommended a lift procedure. We discussed the risk of recurrence being approximately 20-25%. We discussed the risk of incontinence to be relatively low. We discussed the main other risk of procedure to be bleeding, infection and recurrence requiring additional surgery. She agrees to proceed with the planned procedure. We will get this scheduled at her convenience.  [name], MD  Colon and Rectal Surgery / [facility]

## 2014-01-31 ENCOUNTER — Encounter (HOSPITAL_COMMUNITY): Payer: Self-pay | Admitting: General Surgery

## 2014-02-02 ENCOUNTER — Ambulatory Visit (INDEPENDENT_AMBULATORY_CARE_PROVIDER_SITE_OTHER): Payer: BC Managed Care – PPO | Admitting: Family Medicine

## 2014-02-02 VITALS — BP 102/63 | HR 76 | Temp 98.0°F | Resp 16 | Wt 277.0 lb

## 2014-02-02 DIAGNOSIS — I10 Essential (primary) hypertension: Secondary | ICD-10-CM

## 2014-02-02 NOTE — Progress Notes (Signed)
   Subjective:    Patient ID: Stacey Castaneda, female    DOB: May 20, 1970, 43 y.o.   MRN: [id number]  HPI Here for post-op follow of hypertension. Feeling well at 7 days post op; has had bowel movements (taking bisacodyl); logging BPs, temp and weight and all are wnl. Wound care going well with small amount drainage. Starts working from home tomorrow.  Did have Flu vaccination 8 days ago.    Review of Systems     Objective:   Physical Exam        Assessment & Plan:  BP today wnl. Will make next appointment here and has post op appointment with surgeon February 21, 2014. P.Tyjay Galindo, RN  Agree, blood pressure looks fantastic. Beatrice Lecher, MD

## 2014-02-10 ENCOUNTER — Telehealth: Payer: Self-pay | Admitting: *Deleted

## 2014-02-10 DIAGNOSIS — G4733 Obstructive sleep apnea (adult) (pediatric): Secondary | ICD-10-CM

## 2014-02-10 NOTE — Telephone Encounter (Signed)
Stacey Castaneda from [facility] sleep lab called and lvm stating that pt's insurance denied the in lab titration for her cpap. Will forward to Dr. Madilyn Fireman for advice.[name] [place]

## 2014-02-16 NOTE — Telephone Encounter (Signed)
Please call pt and let her know sleep lab let us know the insurance denied in lab titration so we will order Auto pap setting for CPAP at home. Then we can down load the data after 2 weeks and then set the CPAP off of that .  Order placed, please sign and fax.

## 2014-03-02 MED ORDER — AMBULATORY NON FORMULARY MEDICATION: Status: DC

## 2014-03-10 ENCOUNTER — Other Ambulatory Visit: Payer: Self-pay | Admitting: Family Medicine

## 2014-03-10 DIAGNOSIS — Z1231 Encounter for screening mammogram for malignant neoplasm of breast: Secondary | ICD-10-CM

## 2014-03-16 ENCOUNTER — Telehealth: Payer: Self-pay | Admitting: Family Medicine

## 2014-03-16 ENCOUNTER — Other Ambulatory Visit: Payer: Self-pay | Admitting: Family Medicine

## 2014-03-16 MED ORDER — AMBULATORY NON FORMULARY MEDICATION: Status: DC

## 2014-03-16 NOTE — Telephone Encounter (Signed)
Aden states that she was denied for the CPAP. Margette Fast, RMA

## 2014-03-16 NOTE — Telephone Encounter (Signed)
Please call patient:please see if she's been contacted by supplier for CPAP and if she has been using it. The order was sent about 3 or 4 weeks ago I just wanted to make sure that she has heard from them. It should be temporary to call to get a download of her CPAP for compliance.

## 2014-03-17 NOTE — Telephone Encounter (Signed)
Can you get me her insurance stuff. If they are not covering that is radiculous. [place] [facility] did we use.

## 2014-03-24 ENCOUNTER — Encounter: Payer: Self-pay | Admitting: *Deleted

## 2014-03-24 ENCOUNTER — Ambulatory Visit (INDEPENDENT_AMBULATORY_CARE_PROVIDER_SITE_OTHER): Payer: BC Managed Care – PPO

## 2014-03-24 DIAGNOSIS — Z1231 Encounter for screening mammogram for malignant neoplasm of breast: Secondary | ICD-10-CM

## 2014-03-24 DIAGNOSIS — G4733 Obstructive sleep apnea (adult) (pediatric): Secondary | ICD-10-CM | POA: Insufficient documentation

## 2014-04-04 ENCOUNTER — Other Ambulatory Visit: Payer: Self-pay | Admitting: *Deleted

## 2014-04-04 DIAGNOSIS — G4733 Obstructive sleep apnea (adult) (pediatric): Secondary | ICD-10-CM

## 2014-04-04 MED ORDER — AMBULATORY NON FORMULARY MEDICATION: Status: DC

## 2014-04-05 NOTE — Telephone Encounter (Signed)
I sent Cruzita's sleep study, insurance info and order for CPAP titration to [facility] yesterday. Margette Fast, CMA

## 2014-04-09 ENCOUNTER — Other Ambulatory Visit: Payer: Self-pay | Admitting: Family Medicine

## 2014-04-11 ENCOUNTER — Encounter: Payer: Self-pay | Admitting: Family Medicine

## 2014-04-11 ENCOUNTER — Encounter: Payer: Self-pay | Admitting: Physician Assistant

## 2014-04-11 ENCOUNTER — Encounter: Payer: Self-pay | Admitting: Obstetrics & Gynecology

## 2014-04-11 ENCOUNTER — Ambulatory Visit (INDEPENDENT_AMBULATORY_CARE_PROVIDER_SITE_OTHER): Payer: BC Managed Care – PPO | Admitting: Obstetrics & Gynecology

## 2014-04-11 ENCOUNTER — Telehealth: Payer: Self-pay | Admitting: Family Medicine

## 2014-04-11 ENCOUNTER — Ambulatory Visit (INDEPENDENT_AMBULATORY_CARE_PROVIDER_SITE_OTHER): Payer: BC Managed Care – PPO | Admitting: Family Medicine

## 2014-04-11 VITALS — BP 113/73 | HR 90 | Ht 64.0 in | Wt 273.0 lb

## 2014-04-11 VITALS — BP 120/84 | HR 86 | Resp 16 | Ht 64.0 in | Wt 273.0 lb

## 2014-04-11 DIAGNOSIS — K921 Melena: Secondary | ICD-10-CM

## 2014-04-11 DIAGNOSIS — Z01419 Encounter for gynecological examination (general) (routine) without abnormal findings: Secondary | ICD-10-CM | POA: Diagnosis not present

## 2014-04-11 DIAGNOSIS — R61 Generalized hyperhidrosis: Secondary | ICD-10-CM

## 2014-04-11 DIAGNOSIS — L918 Other hypertrophic disorders of the skin: Secondary | ICD-10-CM

## 2014-04-11 MED ORDER — LOSARTAN POTASSIUM-HCTZ 100-12.5 MG PO TABS
1.0000 | ORAL_TABLET | Freq: Every morning | ORAL | Status: DC
Start: 2014-04-11 — End: 2014-07-11

## 2014-04-11 NOTE — Addendum Note (Signed)
Addended by: Gretchen Short on: 04/11/2014 03:03 PM   Modules accepted: Orders

## 2014-04-11 NOTE — Progress Notes (Signed)
  Subjective:     Stacey Castaneda is a 43 y.o. female here for a routine exam.  Current complaints: hot flashes for several nights, worried she is going into menopause.  Needs FSH and TSH today.  Pt c/o bloody bowel movements.  Pt's general surgeon has repaired her fistula nad does not think it is coming from this area.  Pt requests colonoscopy.  Will refer to Dr. Carlean Purl.  Personal health questionnaire reviewed: yes.   Gynecologic History No LMP recorded. Patient is not currently having periods (Reason: IUD). Contraception: IUD Last Pap: 2014. Results were: normal with negative co testing Last mammogram: 2015. Results were: normal  Obstetric History OB History  Gravida Para Term Preterm AB SAB TAB Ectopic Multiple Living  0                  The following portions of the patient's history were reviewed and updated as appropriate: allergies, current medications, past family history, past medical history, past social history, past surgical history and problem list.  Review of Systems Pertinent items are noted in HPI.    Objective:   Filed Vitals:   04/11/14 1413  BP: 120/84  Pulse: 86  Resp: 16  Height: 5\' 4"  (1.626 m)  Weight: 273 lb (123.832 kg)   Vitals:  WNL General appearance: alert, cooperative and no distress Head: Normocephalic, without obvious abnormality, atraumatic Eyes: negative Throat: lips, mucosa, and tongue normal; teeth and gums normal Lungs: clear to auscultation bilaterally Breasts: normal appearance, no masses or tenderness, No nipple retraction or dimpling, No nipple discharge or bleeding Heart: regular rate and rhythm Abdomen: soft, non-tender; bowel sounds normal; no masses,  no organomegaly  Pelvic:  External Genitalia:  Tanner V, no lesion Urethra:  No prolpase Vagina:  Pink, normal rugae, no blood or discharge Cervix:  No CMT, no lesion; strings seen. Uterus:  Normal size and contour, non tender Adnexa:  Normal, no masses, non  tender Rectovaginal exam defer to general surgeon due to recent fistula surgery. Extremities: no edema, redness or tenderness in the calves or thighs Skin: no lesions or rash Lymph nodes: Axillary adenopathy: none     Assessment:    Healthy female exam.   IUD in place    Plan:    Education reviewed: self breast exams. Contraception: IUD. Follow up in: 1 year. Pap not due this year  Refer to GI for bloody bowel mvmts.    TSH and FSH for night sweats.

## 2014-04-11 NOTE — Addendum Note (Signed)
Addended by: Gretchen Short on: 04/11/2014 03:20 PM   Modules accepted: Orders

## 2014-04-11 NOTE — Progress Notes (Signed)
   Subjective:    Patient ID: Stacey Castaneda, female    DOB: [birth year], 43 y.o.   MRN: [id number]  HPI  Skin Tag Removal Procedure Note  Pre-operative Diagnosis: Classic skin tags (acrochordon)  Post-operative Diagnosis: Classic skin tags (acrochordon)  Locations:neck    15 lesions removed. 1 under left axilla. 14 around the neck.   Indications: Irritation  Anesthesia: Lidocaine 1% without epinephrine with one lesion on the left side f the neck  Procedure Details  The risks (including bleeding and infection) and benefits of the procedure and Verbal informed consent obtained. Using sterile iris scissors, multiple skin tags were snipped off at their bases after cleansing with Betadine.  Bleeding was controlled by pressure.   Findings: Pathognomonic benign lesions  not sent for pathological exam.  Condition: Stable  Complications: none.  Plan: 1. Instructed to keep the wounds dry and covered for 24-48h and clean thereafter. 2. Warning signs of infection were reviewed.   3. Recommended that the patient use OTC acetaminophen as needed for pain.  4. Return as needed. 5. F/U wound care reviewed.    Review of Systems     Objective:   Physical Exam        Assessment & Plan:

## 2014-04-11 NOTE — Telephone Encounter (Signed)
Patient still has not heard from home health for CPAP and supplies. Just want to check on this this week if possible.

## 2014-04-12 ENCOUNTER — Telehealth: Payer: Self-pay | Admitting: *Deleted

## 2014-04-12 ENCOUNTER — Other Ambulatory Visit: Payer: Self-pay | Admitting: *Deleted

## 2014-04-12 DIAGNOSIS — G4733 Obstructive sleep apnea (adult) (pediatric): Secondary | ICD-10-CM

## 2014-04-12 LAB — BASIC METABOLIC PANEL
BUN: 11 mg/dL (ref 6–23)
CALCIUM: 9.5 mg/dL (ref 8.4–10.5)
CO2: 22 meq/L (ref 19–32)
CREATININE: 0.65 mg/dL (ref 0.50–1.10)
Chloride: 103 mEq/L (ref 96–112)
Glucose, Bld: 89 mg/dL (ref 70–99)
Potassium: 4.1 mEq/L (ref 3.5–5.3)
SODIUM: 138 meq/L (ref 135–145)

## 2014-04-12 LAB — FOLLICLE STIMULATING HORMONE: FSH: 8.1 m[IU]/mL

## 2014-04-12 LAB — WET PREP BY MOLECULAR PROBE
CANDIDA SPECIES: NEGATIVE
Gardnerella vaginalis: NEGATIVE
TRICHOMONAS VAG: NEGATIVE

## 2014-04-12 LAB — TSH: TSH: 1.072 u[IU]/mL (ref 0.350–4.500)

## 2014-04-12 MED ORDER — AMBULATORY NON FORMULARY MEDICATION: Status: DC

## 2014-04-12 NOTE — Telephone Encounter (Signed)
-----   Message from Guss Bunde, MD sent at 04/12/2014  8:52 AM EST ----- Negative wet prep

## 2014-04-12 NOTE — Telephone Encounter (Signed)
LM on voicemail that her affirm was negative.

## 2014-04-13 NOTE — Telephone Encounter (Signed)
Stacey Castaneda called [facility] today to find out what the hold up is. She left a message for a return call.

## 2014-04-19 ENCOUNTER — Telehealth: Payer: Self-pay | Admitting: *Deleted

## 2014-04-19 ENCOUNTER — Encounter: Payer: Self-pay | Admitting: Physician Assistant

## 2014-04-19 ENCOUNTER — Ambulatory Visit (INDEPENDENT_AMBULATORY_CARE_PROVIDER_SITE_OTHER): Payer: BC Managed Care – PPO | Admitting: Physician Assistant

## 2014-04-19 VITALS — BP 120/80 | HR 84 | Ht 64.0 in | Wt 274.0 lb

## 2014-04-19 DIAGNOSIS — K921 Melena: Secondary | ICD-10-CM

## 2014-04-19 NOTE — Patient Instructions (Signed)

## 2014-04-19 NOTE — Progress Notes (Signed)
Patient ID: Stacey Castaneda, female   DOB: June 05, 1970, 43 y.o.   MRN: [id number]    HPI:    Stacey Castaneda is a 43 year old female referred for evaluation by Silas Sacramento, M.D., due to hematochezia.     In June 2014 Stacey Castaneda developed perineal pain, fever, and chills. She was seen by her primary care physician and referred to surgery where she was diagnosed with tape. Rectal abscess. She subsequently underwent incision and drainage and open packing of the left perirectal abscess in 6 months later had a seton placed in several months later had the seton removed. Through that time, she has had intermittent episodes of blood in her stool with blood on the toilet tissue, blood dripping in the commode, and blood striping on the stool. She recently was seen by her gynecologist and referred for colonoscopy due to her hematochezia. She has had no change in her bowel habits or stool caliber she's had no anorexia or unexplained weight loss.   Past Medical History  Diagnosis Date  . Anxiety   . Hypertension   . Depression   . Perianal fistula   . PONV (postoperative nausea and vomiting)   . Seasonal allergic rhinitis   . Thyroid goiter     BENIGN- remains "tested negative"  . Wears contact lenses   . Vitreous floaters of left eye     evaluated-not always an issue  . Poor venous access     01-26-14 "states having PICC line placed on 01-27-14"  . Perirectal abscess 10/2012    Past Surgical History  Procedure Laterality Date  . Wisdom tooth extraction  AGE 35  . Incision and drainage perirectal abscess N/A 10/30/2012    Procedure: peri rectal INCISION AND DRAINAGE ABSCESS;  Surgeon: Earnstine Regal, MD;  Location: [facility];  Service: General;  Laterality: N/A;  . Hysteroscopy w/d&c  08-22-2005  . Dilatation of cervix/  removal and replacement mirena  10-22-2010  . Dilation and curettage of uterus  1990's  . Evaluation under anesthesia with anal fistulectomy N/A 09/03/2013    Procedure: EXAM UNDER  ANESTHESIA ;  Surgeon: Leighton Ruff, MD;  Location: [facility];  Service: General;  Laterality: N/A;  . Placement of seton N/A 09/03/2013    Procedure: PLACEMENT OF SETON;  Surgeon: Leighton Ruff, MD;  Location: [facility];  Service: General;  Laterality: N/A;  . Anal fistulectomy N/A 01/28/2014    Procedure: LIGATION OF INTERNAL FISTULA TRACT, REMOVAL OF SETON ;  Surgeon: Leighton Ruff, MD;  Location: [facility];  Service: General;  Laterality: N/A;   Family History  Problem Relation Age of Onset  . Diabetes Father   . Heart disease Father   . Hypertension Father   . Diabetes Mother   . Hypertension Mother   . Lung cancer Mother   . Breast cancer Maternal Grandmother    History  Substance Use Topics  . Smoking status: Former Smoker -- 1.50 packs/day for 10 years    Types: Cigarettes    Quit date: 10/31/2006  . Smokeless tobacco: Never Used  . Alcohol Use: Yes     Comment: RARE   Current Outpatient Prescriptions  Medication Sig Dispense Refill  . AMBULATORY NON FORMULARY MEDICATION Medication Name: Dx. OSA.  autopap set to 2-10 cm water pressure.  Please fax down load after 2 weeks. 1 Units 0  . AMBULATORY NON FORMULARY MEDICATION Medication Name: CPAP set to autopap with range of 4 to 20.  Dx OSA, moderate. Please provide face mask, humidier,  and all supplies. 1 Units PRN  . bisacodyl (DULCOLAX) 5 MG EC tablet Take 5 mg by mouth daily as needed for moderate constipation.    . cetirizine (ZYRTEC) 10 MG tablet Take 10 mg by mouth every morning.     . fluticasone (FLONASE) 50 MCG/ACT nasal spray Place 2 sprays into both nostrils every morning. Use two sprays in each nostril daily    . ibuprofen (ADVIL,MOTRIN) 200 MG tablet Take 800 mg by mouth every 6 (six) hours as needed for mild pain.     Marland Kitchen lidocaine (XYLOCAINE) 5 % ointment     . losartan-hydrochlorothiazide (HYZAAR) 100-12.5 MG per tablet Take 1 tablet by mouth every morning. 90 tablet 1  . Multiple Vitamins-Minerals (MULTIVITAMIN GUMMIES  ADULT PO) Take 2 each by mouth every morning.    Marland Kitchen OVER THE COUNTER MEDICATION Place 1-2 drops into both eyes once as needed (dry eyes/ contacts.).     No current facility-administered medications for this visit.   Allergies  Allergen Reactions  . Lisinopril Cough     Review of Systems: Gen: Denies any fever, chills, sweats, anorexia, fatigue, weakness, malaise, weight loss, and sleep disorder CV: Denies chest pain, angina, palpitations, syncope, orthopnea, PND, peripheral edema, and claudication. Resp: Denies dyspnea at rest, dyspnea with exercise, cough, sputum, wheezing, coughing up blood, and pleurisy. GI: Denies vomiting blood, jaundice, and fecal incontinence.   Denies dysphagia or odynophagia. GU : Denies urinary burning, blood in urine, urinary frequency, urinary hesitancy, nocturnal urination, and urinary incontinence. MS: Denies joint pain, limitation of movement, and swelling, stiffness, low back pain, extremity pain. Denies muscle weakness, cramps, atrophy.  Derm: Denies rash, itching, dry skin, hives, moles, warts, or unhealing ulcers.  Psych: Denies depression, anxiety, memory loss, suicidal ideation, hallucinations, paranoia, and confusion. Heme: Denies bruising, bleeding, and enlarged lymph nodes. Neuro:  Denies any headaches, dizziness, paresthesias. Endo:  Denies any problems with DM, thyroid, adrenal function  Physical Exam: BP 120/80 mmHg  Pulse 84  Ht 5\' 4"  (1.626 m)  Wt 274 lb (124.286 kg)  BMI 47.01 kg/m2 Constitutional: Pleasant,well-developed, female in no acute distress. HEENT: Normocephalic and atraumatic. Conjunctivae are normal. No scleral icterus. Neck supple.  Cardiovascular: Normal rate, regular rhythm.  Pulmonary/chest: Effort normal and breath sounds normal. No wheezing, rales or rhonchi. Abdominal: Soft, nondistended, nontender. Bowel sounds active throughout. There are no masses palpable. No hepatomegaly. Rectal: skin tag noted, fistula site  well healled, no fluctuance, brown stool, heme negative Extremities: no edema Lymphadenopathy: No cervical adenopathy noted. Neurological: Alert and oriented to person place and time. Skin: Skin is warm and dry. No rashes noted. Psychiatric: Normal mood and affect. Behavior is normal.  ASSESSMENT AND PLAN:  43 year old female status post multiple episodes of blood with her stools referred for evaluation. She has been advised to undergo colonoscopy to screen for polyps, neoplasia, inflammatory bowel disease, as well as to evaluate for possible internal hemorrhoids. If she is noted to have internal hemorrhoids she would like to discuss possible banding.The risks, benefits, and alternatives to colonoscopy with possible biopsy and possible polypectomy were discussed with the patient and they consent to proceed.  The procedure will be scheduled with Dr. Carlean Purl per patient request.    Haydon Dorris, Vita Barley PA-C 04/19/2014, 11:20 AM

## 2014-04-19 NOTE — Telephone Encounter (Signed)
[facility] has been unable to provide care for CPap after faxing order and 2 calls with no call back I am sending order to [facility]. Margette Fast, CMA

## 2014-04-20 NOTE — Telephone Encounter (Signed)
Finally approved. [facility] will provide the supplies.

## 2014-04-22 NOTE — Progress Notes (Signed)
Agree w/ Ms. Hvozdovic's note and mangement.  

## 2014-05-11 ENCOUNTER — Ambulatory Visit (AMBULATORY_SURGERY_CENTER): Payer: BLUE CROSS/BLUE SHIELD | Admitting: Internal Medicine

## 2014-05-11 ENCOUNTER — Encounter: Payer: Self-pay | Admitting: Internal Medicine

## 2014-05-11 VITALS — BP 112/67 | HR 83 | Temp 98.7°F | Resp 17 | Ht 64.0 in | Wt 274.0 lb

## 2014-05-11 DIAGNOSIS — K5730 Diverticulosis of large intestine without perforation or abscess without bleeding: Secondary | ICD-10-CM

## 2014-05-11 DIAGNOSIS — K648 Other hemorrhoids: Secondary | ICD-10-CM

## 2014-05-11 DIAGNOSIS — K921 Melena: Secondary | ICD-10-CM

## 2014-05-11 DIAGNOSIS — K644 Residual hemorrhoidal skin tags: Secondary | ICD-10-CM

## 2014-05-11 MED ORDER — SODIUM CHLORIDE 0.9 % IV SOLN: 500.0000 mL | INTRAVENOUS | Status: DC

## 2014-05-11 NOTE — Patient Instructions (Addendum)
There are some small hemorrhoids in the anal canal that must be bleeding at times. These are not appropriate to band as it would be very painful. This is not dangerous. If it fails to stop over time let me or Dr. Marcello Moores know.  You also have a condition called diverticulosis - common and not usually a problem. Please read the handout provided.  Next routine colonoscopy in 10 years - 2026.  I appreciate the opportunity to care for you. Gatha Mayer, MD, FACG       YOU HAD AN ENDOSCOPIC PROCEDURE TODAY AT [facility]: Refer to the procedure report that was given to you for any specific questions about what was found during the examination.  If the procedure report does not answer your questions, please call your gastroenterologist to clarify.  If you requested that your care partner not be given the details of your procedure findings, then the procedure report has been included in a sealed envelope for you to review at your convenience later.  YOU SHOULD EXPECT: Some feelings of bloating in the abdomen. Passage of more gas than usual.  Walking can help get rid of the air that was put into your GI tract during the procedure and reduce the bloating. If you had a lower endoscopy (such as a colonoscopy or flexible sigmoidoscopy) you may notice spotting of blood in your stool or on the toilet paper. If you underwent a bowel prep for your procedure, then you may not have a normal bowel movement for a few days.  DIET: Your first meal following the procedure should be a light meal and then it is ok to progress to your normal diet.  A half-sandwich or bowl of soup is an example of a good first meal.  Heavy or fried foods are harder to digest and may make you feel nauseous or bloated.  Likewise meals heavy in dairy and vegetables can cause extra gas to form and this can also increase the bloating.  Drink plenty of fluids but you should avoid alcoholic beverages for 24  hours.  ACTIVITY: Your care partner should take you home directly after the procedure.  You should plan to take it easy, moving slowly for the rest of the day.  You can resume normal activity the day after the procedure however you should NOT DRIVE or use heavy machinery for 24 hours (because of the sedation medicines used during the test).    SYMPTOMS TO REPORT IMMEDIATELY: A gastroenterologist can be reached at any hour.  During normal business hours, 8:30 AM to 5:00 PM Monday through Friday, call [phone].  After hours and on weekends, please call the GI answering service at [phone] who will take a message and have the physician on call contact you.   Following lower endoscopy (colonoscopy or flexible sigmoidoscopy):  Excessive amounts of blood in the stool  Significant tenderness or worsening of abdominal pains  Swelling of the abdomen that is new, acute  Fever of 100F or higher  Following upper endoscopy (EGD)  Vomiting of blood or coffee ground material  New chest pain or pain under the shoulder blades  Painful or persistently difficult swallowing  New shortness of breath  Fever of 100F or higher  Black, tarry-looking stools  FOLLOW UP: If any biopsies were taken you will be contacted by phone or by letter within the next 1-3 weeks.  Call your gastroenterologist if you have not heard about the biopsies in 3 weeks.  Our staff will call the home number listed on your records the next business day following your procedure to check on you and address any questions or concerns that you may have at that time regarding the information given to you following your procedure. This is a courtesy call and so if there is no answer at the home number and we have not heard from you through the emergency physician on call, we will assume that you have returned to your regular daily activities without incident.  SIGNATURES/CONFIDENTIALITY: You and/or your care partner have signed  paperwork which will be entered into your electronic medical record.  These signatures attest to the fact that that the information above on your After Visit Summary has been reviewed and is understood.  Full responsibility of the confidentiality of this discharge information lies with you and/or your care-partner.    YOU HAD AN ENDOSCOPIC PROCEDURE TODAY AT [facility]: Refer to the procedure report that was given to you for any specific questions about what was found during the examination.  If the procedure report does not answer your questions, please call your gastroenterologist to clarify.  If you requested that your care partner not be given the details of your procedure findings, then the procedure report has been included in a sealed envelope for you to review at your convenience later.  YOU SHOULD EXPECT: Some feelings of bloating in the abdomen. Passage of more gas than usual.  Walking can help get rid of the air that was put into your GI tract during the procedure and reduce the bloating. If you had a lower endoscopy (such as a colonoscopy or flexible sigmoidoscopy) you may notice spotting of blood in your stool or on the toilet paper. If you underwent a bowel prep for your procedure, then you may not have a normal bowel movement for a few days.  DIET: Your first meal following the procedure should be a light meal and then it is ok to progress to your normal diet.  A half-sandwich or bowl of soup is an example of a good first meal.  Heavy or fried foods are harder to digest and may make you feel nauseous or bloated.  Likewise meals heavy in dairy and vegetables can cause extra gas to form and this can also increase the bloating.  Drink plenty of fluids but you should avoid alcoholic beverages for 24 hours.  ACTIVITY: Your care partner should take you home directly after the procedure.  You should plan to take it easy, moving slowly for the rest of the day.  You can resume  normal activity the day after the procedure however you should NOT DRIVE or use heavy machinery for 24 hours (because of the sedation medicines used during the test).    SYMPTOMS TO REPORT IMMEDIATELY: A gastroenterologist can be reached at any hour.  During normal business hours, 8:30 AM to 5:00 PM Monday through Friday, call [phone].  After hours and on weekends, please call the GI answering service at [phone] who will take a message and have the physician on call contact you.   Following lower endoscopy (colonoscopy or flexible sigmoidoscopy):  Excessive amounts of blood in the stool  Significant tenderness or worsening of abdominal pains  Swelling of the abdomen that is new, acute  Fever of 100F or higher  FOLLOW UP: If any biopsies were taken you will be contacted by phone or by letter within the next 1-3 weeks.  Call your gastroenterologist  if you have not heard about the biopsies in 3 weeks.  Our staff will call the home number listed on your records the next business day following your procedure to check on you and address any questions or concerns that you may have at that time regarding the information given to you following your procedure. This is a courtesy call and so if there is no answer at the home number and we have not heard from you through the emergency physician on call, we will assume that you have returned to your regular daily activities without incident.  SIGNATURES/CONFIDENTIALITY: You and/or your care partner have signed paperwork which will be entered into your electronic medical record.  These signatures attest to the fact that that the information above on your After Visit Summary has been reviewed and is understood.  Full responsibility of the confidentiality of this discharge information lies with you and/or your care-partner.    Resume medications. Information given on diverticulosis and hemorrhoids with discharge instructions.

## 2014-05-11 NOTE — Progress Notes (Signed)
Report to PACU, RN, vss, BBS= Clear.  

## 2014-05-11 NOTE — Op Note (Signed)
[facility]  [address] [place], [id number]   COLONOSCOPY PROCEDURE REPORT  PATIENT: Stacey Castaneda, Stacey Castaneda  MR#: [id number] BIRTHDATE: [birth year] , 43  yrs. old GENDER: female ENDOSCOPIST: Gatha Mayer, MD, [facility] PROCEDURE DATE:  05/11/2014 PROCEDURE:   Colonoscopy, diagnostic First Screening Colonoscopy - Avg.  risk and is 50 yrs.  old or older - No.  Prior Negative Screening - Now for repeat screening. N/A  History of Adenoma - Now for follow-up colonoscopy & has been > or = to 3 yrs.  N/A  Polyps Removed Today? No.  Polyps Removed Today? No.  Recommend repeat exam, <10 yrs? Polyps Removed Today? No.  Recommend repeat exam, <10 yrs? No. ASA CLASS:   Class III INDICATIONS:hematochezia. MEDICATIONS: Propofol 260 mg IV and Monitored anesthesia care  DESCRIPTION OF PROCEDURE:   After the risks benefits and alternatives of the procedure were thoroughly explained, informed consent was obtained.  The digital rectal exam revealed a skin tag and fistula repair scar.   The LB [id number]  endoscope was introduced through the anus and advanced to the cecum, which was identified by both the appendix and ileocecal valve. No adverse events experienced.   The quality of the prep was excellent, using MiraLax  The instrument was then slowly withdrawn as the colon was fully examined.      COLON FINDINGS: There was moderate diverticulosis noted in the left colon.   Small external hemorrhoids were found.   The examination was otherwise normal.  Retroflexed views revealed external hemorrhoids. The time to cecum=1 minutes 48 seconds.  Withdrawal time=8 minutes 11 seconds.  The scope was withdrawn and the procedure completed. COMPLICATIONS: There were no immediate complications.  ENDOSCOPIC IMPRESSION: 1.   Moderate diverticulosis was noted in the left colon 2.   Small external hemorrhoids 3.   The examination was otherwise normal  RECOMMENDATIONS: Repeat colonoscopy  10 years. If persistent bleeding return to me or Dr. Marcello Moores  eSigned:  Gatha Mayer, MD, [facility] 05/11/2014 10:55 AM   cc:  The Patient, Veneda Melter, MD

## 2014-05-12 ENCOUNTER — Telehealth: Payer: Self-pay

## 2014-05-12 NOTE — Telephone Encounter (Signed)
  Follow up Call-  Call back number 05/11/2014  Post procedure Call Back phone  # [phone]  Permission to leave phone message Yes     Patient questions:  Do you have a fever, pain , or abdominal swelling? No. Pain Score  0 *  Have you tolerated food without any problems? Yes.    Have you been able to return to your normal activities? Yes.    Do you have any questions about your discharge instructions: Diet   No. Medications  No. Follow up visit  No.  Do you have questions or concerns about your Care? No.  Actions: * If pain score is 4 or above: No action needed, pain <4.  No problems per the pt. maw

## 2014-05-19 ENCOUNTER — Encounter (HOSPITAL_BASED_OUTPATIENT_CLINIC_OR_DEPARTMENT_OTHER): Payer: Self-pay | Admitting: General Surgery

## 2014-06-01 ENCOUNTER — Telehealth: Payer: Self-pay | Admitting: Family Medicine

## 2014-06-01 MED ORDER — AMBULATORY NON FORMULARY MEDICATION: Status: DC

## 2014-06-01 NOTE — Telephone Encounter (Signed)
Pt informed.Stacey Castaneda  

## 2014-06-01 NOTE — Telephone Encounter (Signed)
Please call patient and let her know that I did receive the download from her CPAP from recordings from December 2013 to January 21. I'm going to send over an order to [facility] in [place] to such her CPAP to 13 cm of water pressure. If she feels like this is too high over the next few weeks and please let me know. Try to follow-up in the next month or so for sleep apnea.

## 2014-06-07 ENCOUNTER — Encounter: Payer: Self-pay | Admitting: Family Medicine

## 2014-07-11 ENCOUNTER — Ambulatory Visit (INDEPENDENT_AMBULATORY_CARE_PROVIDER_SITE_OTHER): Payer: BLUE CROSS/BLUE SHIELD | Admitting: Family Medicine

## 2014-07-11 ENCOUNTER — Encounter: Payer: Self-pay | Admitting: Family Medicine

## 2014-07-11 VITALS — BP 130/80 | HR 83 | Wt 287.0 lb

## 2014-07-11 DIAGNOSIS — G4733 Obstructive sleep apnea (adult) (pediatric): Secondary | ICD-10-CM | POA: Diagnosis not present

## 2014-07-11 DIAGNOSIS — I10 Essential (primary) hypertension: Secondary | ICD-10-CM | POA: Diagnosis not present

## 2014-07-11 MED ORDER — LOSARTAN POTASSIUM 100 MG PO TABS: 100.0000 mg | ORAL_TABLET | Freq: Every day | ORAL | Status: DC

## 2014-07-11 MED ORDER — AMBULATORY NON FORMULARY MEDICATION: Status: DC

## 2014-07-11 NOTE — Progress Notes (Signed)
   Subjective:    Patient ID: Stacey Castaneda, female    DOB: 30-May-1970, 44 y.o.   MRN: [id number]  HPI CPAP from recordings from December 2013 to January 21. I'm going to send over an order to [facility] in [place] to such her CPAP to 13 cm of water pressure.  She feels like the 13 is too high. In facts she hasn't used it at all bc was making her sleep worse.     Hypertension- Pt denies chest pain, SOB, dizziness, or heart palpitations.  Taking meds as directed w/o problems.  Denies medication side effects. She would like to discontinue the HCTZ component of her losartan. She says it makes her urinate very frequently for about 4 hours after she takes it. This is troublesome at work.     Review of Systems     Objective:   Physical Exam  Constitutional: She is oriented to person, place, and time. She appears well-developed and well-nourished.  HENT:  Head: Normocephalic and atraumatic.  Cardiovascular: Normal rate, regular rhythm and normal heart sounds.   Pulmonary/Chest: Effort normal and breath sounds normal.  Neurological: She is alert and oriented to person, place, and time.  Skin: Skin is warm and dry.  Psychiatric: She has a normal mood and affect. Her behavior is normal.          Assessment & Plan:  OSA- Using [facility].Will start with 6 cm water pressure. Will call in 3 weekand let me know if doing well and if ok ot slowly increase pressure by 1 or 2 cm water pressure. Wear nightly.   Hypertension-  Well-controlled on crrent regimen. We'll go ahead and switch to just plain losartan without the HCTZ. Encouraged her to follow-up in one month just make sure her blood pressure still well controlled. Treating her sleep apnea will help as well.

## 2014-07-21 ENCOUNTER — Ambulatory Visit (INDEPENDENT_AMBULATORY_CARE_PROVIDER_SITE_OTHER): Payer: BLUE CROSS/BLUE SHIELD | Admitting: Family Medicine

## 2014-07-21 ENCOUNTER — Encounter: Payer: Self-pay | Admitting: Family Medicine

## 2014-07-21 VITALS — BP 129/84 | HR 82 | Temp 98.1°F | Wt 285.0 lb

## 2014-07-21 DIAGNOSIS — K611 Rectal abscess: Secondary | ICD-10-CM | POA: Diagnosis not present

## 2014-07-21 NOTE — Progress Notes (Signed)
   Subjective:    Patient ID: Stacey Castaneda, female    DOB: [birth year], 44 y.o.   MRN: [id number]  HPI Hx of perirectal abscess/anal fistula. She had ligation of the internal fistula tract back in September 2015. She is feeling some discomfort yesterday and pressure would like me to check today to see if she could be having a recurrence. She had her husband look at the area yesterday and he did not notice anything but then this morning when he looked he said he saw a new red bump that was not there before. She has been applying a topical prescription cream that was given to her by the surgeon to apply after her surgery.   Review of Systems     Objective:   Physical Exam  Constitutional: She is oriented to person, place, and time. She appears well-developed and well-nourished.  HENT:  Head: Normocephalic and atraumatic.  Genitourinary:     Neurological: She is alert and oriented to person, place, and time.  Skin: Skin is warm.  Psychiatric: She has a normal mood and affect. Her behavior is normal.          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 We'll try to call the general surgeon's office to see if we can get her in in the next day if possible.  She is afebrile.

## 2014-07-21 NOTE — Patient Instructions (Signed)
We were able to get to in at [facility] surgery. Please be at their clinic today no later than 3:30 this afternoon. You will be worked and so you may have to wait to be seen.

## 2014-08-02 ENCOUNTER — Ambulatory Visit: Payer: BC Managed Care – PPO | Admitting: Family Medicine

## 2014-08-12 ENCOUNTER — Encounter: Payer: Self-pay | Admitting: *Deleted

## 2014-08-12 ENCOUNTER — Emergency Department
Admission: EM | Admit: 2014-08-12 | Discharge: 2014-08-12 | Disposition: A | Discharge status: Home / Self Care | Payer: BLUE CROSS/BLUE SHIELD | Source: Home / Self Care | Attending: Emergency Medicine | Admitting: Emergency Medicine

## 2014-08-12 DIAGNOSIS — J209 Acute bronchitis, unspecified: Secondary | ICD-10-CM

## 2014-08-12 MED ORDER — PROMETHAZINE-CODEINE 6.25-10 MG/5ML PO SYRP: ORAL_SOLUTION | ORAL | Status: DC

## 2014-08-12 MED ORDER — ALBUTEROL SULFATE HFA 108 (90 BASE) MCG/ACT IN AERS: 2.0000 | INHALATION_SPRAY | RESPIRATORY_TRACT | Status: DC | PRN

## 2014-08-12 MED ORDER — AZITHROMYCIN 200 MG/5ML PO SUSR: ORAL | Status: DC

## 2014-08-12 MED ORDER — PREDNISONE 5 MG/5ML PO SOLN: 20.0000 mg | Freq: Two times a day (BID) | ORAL | Status: DC

## 2014-08-12 MED ORDER — BENZONATATE 200 MG PO CAPS: ORAL_CAPSULE | ORAL | Status: DC

## 2014-08-12 NOTE — ED Notes (Signed)
Pt c/o productive cough with green mucus x 5 days. Denies fever.

## 2014-08-12 NOTE — ED Provider Notes (Signed)
CSN: [id number]     Arrival date & time 08/12/14  1101 History   First MD Initiated Contact with Patient 08/12/14 1109     Chief Complaint  Patient presents with  . Cough   (Consider location/radiation/quality/duration/timing/severity/associated sxs/prior Treatment) HPI 5 days of worsening sinus congestion progressing to chest congestion and cough productive of green sputum, worse last night with frequent hacking cough that kept her up at night. Feels chest congestion and wheezing. May have had low-grade fever. No chills or sweats. Also has discolored rhinorrhea and sinus congestion. Has seasonal allergies and has similar symptoms with change of weather. In the past has had bronchitis with wheezing during the change of seasons. No diagnosis of asthma. She is taking Flonase and generic Zyrtec which helps the seasonal allergy symptoms.  Remainder of Review of Systems negative for acute change except as noted in the HPI.  Past Medical History  Diagnosis Date  . Anxiety   . Hypertension   . Depression   . Perianal fistula   . PONV (postoperative nausea and vomiting)   . Seasonal allergic rhinitis   . Thyroid goiter     BENIGN- remains "tested negative"  . Wears contact lenses   . Vitreous floaters of left eye     evaluated-not always an issue  . Poor venous access     01-26-14 "states having PICC line placed on 01-27-14"  . Perirectal abscess 10/2012   Past Surgical History  Procedure Laterality Date  . Wisdom tooth extraction  AGE 44  . Incision and drainage perirectal abscess N/A 10/30/2012    Procedure: peri rectal INCISION AND DRAINAGE ABSCESS;  Surgeon: Earnstine Regal, MD;  Location: [facility];  Service: General;  Laterality: N/A;  . Hysteroscopy w/d&c  08-22-2005  . Dilatation of cervix/  removal and replacement mirena  10-22-2010  . Dilation and curettage of uterus  1990's  . Evaluation under anesthesia with anal fistulectomy N/A 09/03/2013    Procedure: EXAM UNDER ANESTHESIA ;   Surgeon: Leighton Ruff, MD;  Location: [facility];  Service: General;  Laterality: N/A;  . Placement of seton N/A 09/03/2013    Procedure: PLACEMENT OF SETON;  Surgeon: Leighton Ruff, MD;  Location: [facility];  Service: General;  Laterality: N/A;  . Anal fistulectomy N/A 01/28/2014    Procedure: LIGATION OF INTERNAL FISTULA TRACT, REMOVAL OF SETON ;  Surgeon: Leighton Ruff, MD;  Location: [facility];  Service: General;  Laterality: N/A;   Family History  Problem Relation Age of Onset  . Diabetes Father   . Heart disease Father   . Hypertension Father   . Diabetes Mother   . Hypertension Mother   . Lung cancer Mother   . Breast cancer Maternal Grandmother    History  Substance Use Topics  . Smoking status: Former Smoker -- 1.50 packs/day for 10 years    Types: Cigarettes    Quit date: 10/31/2006  . Smokeless tobacco: Never Used  . Alcohol Use: Yes     Comment: RARE   OB History    Gravida Para Term Preterm AB TAB SAB Ectopic Multiple Living   0              Review of Systems  Allergies  Lisinopril  Home Medications   Prior to Admission medications   Medication Sig Start Date End Date Taking? Authorizing Provider  albuterol (PROVENTIL HFA;VENTOLIN HFA) 108 (90 BASE) MCG/ACT inhaler Inhale 2 puffs into the lungs every 4 (four) hours as needed for wheezing  or shortness of breath. 08/12/14   Jacqulyn Cane, MD  AMBULATORY NON FORMULARY MEDICATION Medication Name: CPAP set to autopap with range of 4 to 20.  Dx OSA, moderate. Please provide face mask, humidier, and all supplies. 04/12/14   Hali Marry, MD  AMBULATORY NON FORMULARY MEDICATION Medication Name: Dx. OSA.  Set CPAP to 6 cm water pressure. Fax to [facility] 07/11/14   Hali Marry, MD  azithromycin (ZITHROMAX) 200 MG/5ML suspension 12.5 ML's NOW by mouth, then 6 mls daily on days 2-5. Take with food. 08/12/14   Jacqulyn Cane, MD  benzonatate (TESSALON) 200 MG capsule Take 1 every 8 hours as needed for cough. 08/12/14   Jacqulyn Cane, MD  cetirizine (ZYRTEC) 10 MG tablet Take 10 mg by mouth every morning.     Historical Provider, MD  fluticasone (FLONASE) 50 MCG/ACT nasal spray Place 2 sprays into both nostrils every morning. Use two sprays in each nostril daily 07/07/13   Hali Marry, MD  ibuprofen (ADVIL,MOTRIN) 200 MG tablet Take 800 mg by mouth every 6 (six) hours as needed for mild pain.     Historical Provider, MD  losartan (COZAAR) 100 MG tablet Take 1 tablet (100 mg total) by mouth daily. 07/11/14   Hali Marry, MD  Multiple Vitamins-Minerals (MULTIVITAMIN GUMMIES ADULT PO) Take 2 each by mouth every morning.    Historical Provider, MD  OVER THE COUNTER MEDICATION Place 1-2 drops into both eyes once as needed (dry eyes/ contacts.).    Historical Provider, MD  predniSONE 5 MG/5ML solution Take 20 mLs (20 mg total) by mouth 2 (two) times daily with a meal. For 5 days. 08/12/14   Jacqulyn Cane, MD  promethazine-codeine [facility] WITH CODEINE) 6.25-10 MG/5ML syrup Take 1-2 teaspoons every 6 hours as needed for cough. May cause drowsiness. Take only at night 08/12/14   Jacqulyn Cane, MD   BP 142/85 mmHg  Pulse 114  Temp(Src) 98.8 F (37.1 C) (Oral)  Resp 20  Ht 5\' 4"  (1.626 m)  Wt 289 lb (131.09 kg)  BMI 49.58 kg/m2  SpO2 97% Physical Exam  Constitutional: She is oriented to person, place, and time. She appears well-developed and well-nourished. No distress.  HENT:  Head: Normocephalic and atraumatic.  Right Ear: Tympanic membrane, external ear and ear canal normal.  Left Ear: Tympanic membrane, external ear and ear canal normal.  Nose: Mucosal edema and rhinorrhea present. Right sinus exhibits maxillary sinus tenderness. Left sinus exhibits maxillary sinus tenderness.  Mouth/Throat: Oropharynx is clear and moist. No oral lesions. No oropharyngeal exudate.  Eyes: Right eye exhibits no discharge. Left eye exhibits no discharge. No scleral icterus.  Neck: Neck supple.  Cardiovascular: Normal rate,  regular rhythm and normal heart sounds.   Pulmonary/Chest: Effort normal. She has wheezes (very mild late expiratory wheezes. Good air movement bilaterally). She has rhonchi. She has no rales.  Lymphadenopathy:    She has no cervical adenopathy.  Neurological: She is alert and oriented to person, place, and time.  Skin: Skin is warm and dry.  Psychiatric: She has a normal mood and affect.  Nursing note and vitals reviewed.   ED Course  Procedures (including critical care time) Labs Review Labs Reviewed - No data to display  Imaging Review No results found.   MDM   1. Bronchitis, acute, with bronchospasm    with sinusitis Treatment options discussed, as well as risks, benefits, alternatives. Patient voiced understanding and agreement with the following plans:   Discharge Medication  List as of 08/12/2014 11:58 AM    START taking these medications   Details  albuterol (PROVENTIL HFA;VENTOLIN HFA) 108 (90 BASE) MCG/ACT inhaler Inhale 2 puffs into the lungs every 4 (four) hours as needed for wheezing or shortness of breath., Starting 08/12/2014, Until Discontinued, Print    azithromycin (ZITHROMAX) 200 MG/5ML suspension 12.5 ML's NOW by mouth, then 6 mls daily on days 2-5. Take with food., Print    benzonatate (TESSALON) 200 MG capsule Take 1 every 8 hours as needed for cough., Print    promethazine-codeine (PHENERGAN WITH CODEINE) 6.25-10 MG/5ML syrup Take 1-2 teaspoons every 6 hours as needed for cough. May cause drowsiness. Take only at night, Print       the above for prescriptions were printed and given to patient   predniSONE 5 MG/5ML solution Take 20 mLs (20 mg total) by mouth 2 (two) times daily with a meal. For 5 days. 200 mL  The prednisone prescription was E prescribed.   Jacqulyn Cane, MD 08/12/14 [phone]

## 2014-08-13 MED ORDER — PREDNISONE (PAK) 10 MG PO TABS: ORAL_TABLET | ORAL | Status: DC

## 2014-08-19 ENCOUNTER — Encounter: Payer: Self-pay | Admitting: Family Medicine

## 2014-08-19 ENCOUNTER — Ambulatory Visit (INDEPENDENT_AMBULATORY_CARE_PROVIDER_SITE_OTHER): Payer: BLUE CROSS/BLUE SHIELD | Admitting: Family Medicine

## 2014-08-19 ENCOUNTER — Telehealth: Payer: Self-pay | Admitting: Family Medicine

## 2014-08-19 VITALS — BP 137/85 | HR 97 | Wt 288.0 lb

## 2014-08-19 DIAGNOSIS — K611 Rectal abscess: Secondary | ICD-10-CM

## 2014-08-19 DIAGNOSIS — J4541 Moderate persistent asthma with (acute) exacerbation: Secondary | ICD-10-CM

## 2014-08-19 DIAGNOSIS — G4733 Obstructive sleep apnea (adult) (pediatric): Secondary | ICD-10-CM | POA: Diagnosis not present

## 2014-08-19 MED ORDER — ALBUTEROL SULFATE HFA 108 (90 BASE) MCG/ACT IN AERS: 2.0000 | INHALATION_SPRAY | RESPIRATORY_TRACT | Status: DC | PRN

## 2014-08-19 NOTE — Telephone Encounter (Signed)
Will call [facility] and see if can delay the ramp up on the CPAP machine. If not we may be able to decrease the pressure.  She still having difficulty tolerating the 6 cm of water pressure. She says it starts to ramp up right before she falls asleep and this keeps her awake.

## 2014-08-19 NOTE — Progress Notes (Signed)
   Subjective:    Patient ID: Stacey Castaneda, female    DOB: [birth year], 44 y.o.   MRN: [id number]  HPI Perirectal abscess - Says did see [facility] and they put her on antibiotics and it went away on its own. Started to get another one but now on ABX for bronchitis and feels that has helped the 2nd one clear up.    Went to [facility] over the weekend for bronchitis - completed the steroids, zpack. Feels like the albuterol has really helped.  Would like to have a refill she says it took until Wednesday afternoon for her to start feeling significantly better. She feels like she is at least 50% better compared to previous. She still coughing a lot and still getting some shortness of breath though. No fevers chills or sweats.   Review of Systems     Objective:   Physical Exam  Constitutional: She is oriented to person, place, and time. She appears well-developed and well-nourished.  HENT:  Head: Normocephalic and atraumatic.  Right Ear: External ear normal.  Left Ear: External ear normal.  Nose: Nose normal.  Mouth/Throat: Oropharynx is clear and moist.  TMs and canals are clear.   Eyes: Conjunctivae and EOM are normal. Pupils are equal, round, and reactive to light.  Neck: Neck supple. No thyromegaly present.  Cardiovascular: Normal rate, regular rhythm and normal heart sounds.   Pulmonary/Chest: Effort normal and breath sounds normal. She has no wheezes.  Inspiratory and expiratory rhonchi diffusely.  Lymphadenopathy:    She has no cervical adenopathy.  Neurological: She is alert and oriented to person, place, and time.  Skin: Skin is warm and dry.  Psychiatric: She has a normal mood and affect.          Assessment & Plan:  Perirectal abscess - resolved for now. Considering 2nd surgery later this year.  Should hopefully reduce her risk of recurrence.  CPAP - Will call [facility] and see if can delay the ramp up on the CPAP machine. If not we may be able to decrease the  pressure.  She still having difficulty tolerating the 6 cm of water pressure. She says it starts to ramp up right before she falls asleep and this keeps her awake.  Asthmatic bronchitis-will do peak flow today. She was in the yellow zone .  Given a peak flow meter and shown how to use it. Encouraged her to use her albuterol every 2 hours as needed. She can do 4 puffs if needed until she is starting to feel better.

## 2014-08-31 MED ORDER — AMBULATORY NON FORMULARY MEDICATION: Status: DC

## 2014-08-31 NOTE — Telephone Encounter (Signed)
Will need to fax order attn Joycelyn Rua. Order faxed.[name] Kohler

## 2014-09-16 ENCOUNTER — Ambulatory Visit (INDEPENDENT_AMBULATORY_CARE_PROVIDER_SITE_OTHER): Payer: BLUE CROSS/BLUE SHIELD | Admitting: Family Medicine

## 2014-09-16 ENCOUNTER — Encounter: Payer: Self-pay | Admitting: Family Medicine

## 2014-09-16 VITALS — BP 130/88 | HR 107 | Wt 289.0 lb

## 2014-09-16 DIAGNOSIS — I10 Essential (primary) hypertension: Secondary | ICD-10-CM

## 2014-09-16 DIAGNOSIS — J45901 Unspecified asthma with (acute) exacerbation: Secondary | ICD-10-CM | POA: Diagnosis not present

## 2014-09-16 DIAGNOSIS — G4733 Obstructive sleep apnea (adult) (pediatric): Secondary | ICD-10-CM | POA: Diagnosis not present

## 2014-09-16 DIAGNOSIS — M25511 Pain in right shoulder: Secondary | ICD-10-CM | POA: Diagnosis not present

## 2014-09-16 MED ORDER — BUDESONIDE-FORMOTEROL FUMARATE 80-4.5 MCG/ACT IN AERO: 2.0000 | INHALATION_SPRAY | Freq: Two times a day (BID) | RESPIRATORY_TRACT | Status: DC

## 2014-09-16 MED ORDER — LOSARTAN POTASSIUM 100 MG PO TABS: 100.0000 mg | ORAL_TABLET | Freq: Every day | ORAL | Status: DC

## 2014-09-16 NOTE — Progress Notes (Signed)
   Subjective:    Patient ID: Stacey Castaneda, female    DOB: 06-24-1970, 44 y.o.   MRN: [id number]  HPI Asthmatic bronchits- she would like to be referred for allergy partners. She really would like to go ahead and get allergy tested. She does feel like the bronchitis has cleared his first infection is concerned. She was treated with azithromycin and prednisone. Unfortunately she went through entire albuterol inhaler in less than a month and was not able to fill it at the pharmacy. She still feels like she is coughing a lot and still short of breath at times.  A couple of weeks ago moved her arm and it felt like it popped out of socket. It felt like a grinding sensation and then was able to move shoulder back and it felt better.    Hypertension-here to also recheck blood pressure. Her pressure was slightly elevated at her previous office visit.  Review of Systems     Objective:   Physical Exam  Constitutional: She is oriented to person, place, and time. She appears well-developed and well-nourished.  HENT:  Head: Normocephalic and atraumatic.  Cardiovascular: Normal rate, regular rhythm and normal heart sounds.   Pulmonary/Chest: Effort normal and breath sounds normal.  Neurological: She is alert and oriented to person, place, and time.  Skin: Skin is warm and dry.  Psychiatric: She has a normal mood and affect. Her behavior is normal.          Assessment & Plan:  Asthmatic bronchitis - will refer to [facility] for allergy testing.  She still fairly short of breath and coughing a lot. She definitely feels better over all those and feels like the infection itself is cleared. She actually went through 1 albuterol inhaler in less than a month. I'm going to go ahead and treat her like she is truly an asthmatic and because she is using her albuterol daily if not more than once a day and going to go ahead and put her on Symbicort.   Right shoulder pain-discussed referral to our  sports medicine doctor, Dr. Dianah Field for further evaluation. I'm not sure if she's getting some type of partial dislocation that's happening. It doesn't actively her today.  Hypertension-it was difficult to get a good reading today since she was coughing throughout the office visit. Next  Objective sleep apnea- has been able to adjust the ramp time an that has helped.

## 2014-09-16 NOTE — Patient Instructions (Signed)
Ask the pharmacist for a spacer.

## 2014-09-26 ENCOUNTER — Institutional Professional Consult (permissible substitution): Payer: BLUE CROSS/BLUE SHIELD | Admitting: Sports Medicine

## 2014-10-06 ENCOUNTER — Institutional Professional Consult (permissible substitution): Payer: BLUE CROSS/BLUE SHIELD | Admitting: Sports Medicine

## 2014-10-10 ENCOUNTER — Encounter: Payer: Self-pay | Admitting: Sports Medicine

## 2014-10-10 ENCOUNTER — Ambulatory Visit (INDEPENDENT_AMBULATORY_CARE_PROVIDER_SITE_OTHER): Payer: BLUE CROSS/BLUE SHIELD

## 2014-10-10 ENCOUNTER — Ambulatory Visit (INDEPENDENT_AMBULATORY_CARE_PROVIDER_SITE_OTHER): Payer: BLUE CROSS/BLUE SHIELD | Admitting: Sports Medicine

## 2014-10-10 VITALS — BP 140/92 | HR 85 | Wt 292.0 lb

## 2014-10-10 DIAGNOSIS — M25511 Pain in right shoulder: Secondary | ICD-10-CM | POA: Diagnosis not present

## 2014-10-10 DIAGNOSIS — S43001A Unspecified subluxation of right shoulder joint, initial encounter: Secondary | ICD-10-CM | POA: Diagnosis not present

## 2014-10-10 MED ORDER — MELOXICAM 15 MG PO TABS: ORAL_TABLET | ORAL | Status: DC

## 2014-10-10 NOTE — Assessment & Plan Note (Signed)
Probable dislocation event as a child, now followed by recurrent subluxation. She does have a positive apprehension sign and a positive relocation sign. We are going to work conservatively, x-rays, meloxicam, formal physical therapy for rotator cuff strengthening. If no improvement in 6 weeks we will need to proceed with an MR arthrogram. This will be looked for a torn labrum, which will predispose to recurrent subluxation. I will see her back in 6 weeks.

## 2014-10-10 NOTE — Progress Notes (Signed)
   Subjective:    I'm seeing this patient as a consultation for:  Dr. Beatrice Lecher  CC: Right shoulder pain  HPI: Decades ago this pleasant 44 year old female had an episode where she felt like her shoulder slipped out of place and pop back in, more recently she had another episode with her arm in an abducted and external rotated position, felt like the arm slipped out, for several seconds, and then a sensation of it slipping back in with severe pain. Symptoms are recurrent and severe, pain radiates to the joint line and over the deltoid.  Past medical history, Surgical history, Family history not pertinant except as noted below, Social history, Allergies, and medications have been entered into the medical record, reviewed, and no changes needed.   Review of Systems: No headache, visual changes, nausea, vomiting, diarrhea, constipation, dizziness, abdominal pain, skin rash, fevers, chills, night sweats, weight loss, swollen lymph nodes, body aches, joint swelling, muscle aches, chest pain, shortness of breath, mood changes, visual or auditory hallucinations.   Objective:   General: Well Developed, well nourished, and in no acute distress.  Neuro/Psych: Alert and oriented x3, extra-ocular muscles intact, able to move all 4 extremities, sensation grossly intact. Skin: Warm and dry, no rashes noted.  Respiratory: Not using accessory muscles, speaking in full sentences, trachea midline.  Cardiovascular: Pulses palpable, no extremity edema. Abdomen: Does not appear distended. Right Shoulder: Inspection reveals no abnormalities, atrophy or asymmetry. Palpation is normal with no tenderness over AC joint or bicipital groove. ROM is full in all planes. Rotator cuff strength weak to external rotation and abduction No signs of impingement with negative Neer and Hawkin's tests, empty can. Speeds and Yergason's tests normal. Positive crank test, positive clunk test, positive apprehension sign  and positive relocation sign. Normal scapular function observed. No painful arc and no drop arm sign.  Impression and Recommendations:   This case required medical decision making of moderate complexity.

## 2014-10-12 ENCOUNTER — Ambulatory Visit: Payer: BLUE CROSS/BLUE SHIELD | Admitting: Rehabilitative and Restorative Service Providers"

## 2014-10-14 ENCOUNTER — Telehealth: Payer: Self-pay

## 2014-10-14 NOTE — Telephone Encounter (Signed)
Patient would rather go to [facility] instead of [facility]. Could you change the referral?

## 2014-10-17 ENCOUNTER — Ambulatory Visit: Payer: BLUE CROSS/BLUE SHIELD | Admitting: Physical Therapy

## 2014-10-17 NOTE — Telephone Encounter (Signed)
Will redo referral and send Ms. Mackert to [facility]

## 2014-10-17 NOTE — Telephone Encounter (Signed)
Routed to Cindy.  

## 2014-10-25 ENCOUNTER — Telehealth: Payer: Self-pay | Admitting: *Deleted

## 2014-10-25 NOTE — Telephone Encounter (Signed)
Try over-the-counter Salonpas patches, also should add 650 mg over-the-counter arthritis strength Tylenol 2 tabs 3 times per day scheduled.

## 2014-10-25 NOTE — Telephone Encounter (Signed)
Pt left vm asking what she could take along with the meloxicam for breakthrough shoulder pain. She would prefer something OTC but will get rx if needed. Please advise.

## 2014-10-26 NOTE — Telephone Encounter (Signed)
[facility] notifying pt of all recommendations.

## 2014-10-28 ENCOUNTER — Institutional Professional Consult (permissible substitution): Payer: BLUE CROSS/BLUE SHIELD | Admitting: Sports Medicine

## 2014-11-10 ENCOUNTER — Other Ambulatory Visit: Payer: Self-pay | Admitting: *Deleted

## 2014-11-10 DIAGNOSIS — S43001A Unspecified subluxation of right shoulder joint, initial encounter: Secondary | ICD-10-CM

## 2014-11-10 MED ORDER — MELOXICAM 15 MG PO TABS: ORAL_TABLET | ORAL | Status: DC

## 2014-11-21 ENCOUNTER — Encounter: Payer: Self-pay | Admitting: Sports Medicine

## 2014-11-21 ENCOUNTER — Ambulatory Visit (INDEPENDENT_AMBULATORY_CARE_PROVIDER_SITE_OTHER): Payer: BLUE CROSS/BLUE SHIELD | Admitting: Sports Medicine

## 2014-11-21 VITALS — BP 160/80 | HR 89 | Ht 64.0 in | Wt 293.0 lb

## 2014-11-21 DIAGNOSIS — M2241 Chondromalacia patellae, right knee: Secondary | ICD-10-CM

## 2014-11-21 DIAGNOSIS — S43001S Unspecified subluxation of right shoulder joint, sequela: Secondary | ICD-10-CM

## 2014-11-21 DIAGNOSIS — E66813 Obesity, class 3: Secondary | ICD-10-CM

## 2014-11-21 NOTE — Assessment & Plan Note (Signed)
Improved significantly with one month of physical therapy. I do think we have a solid 3 total months to rehabilitation before considering MRI arthrogram.

## 2014-11-21 NOTE — Assessment & Plan Note (Signed)
Injection, custom orthotics, formal PT. Weight loss per PCP, return in one month.

## 2014-11-21 NOTE — Progress Notes (Signed)
  Subjective:    CC: follow-up  HPI: Shoulder sensation: After a dislocation of the distant past, improving significantly with formal physical therapy. Only a few recent sensations of shoulder instability.  Right knee pain: Moderate, persistent, localized under the kneecap with grinding, worse going up and down stairs. No radiation, no trauma.  Past medical history, Surgical history, Family history not pertinant except as noted below, Social history, Allergies, and medications have been entered into the medical record, reviewed, and no changes needed.   Review of Systems: No fevers, chills, night sweats, weight loss, chest pain, or shortness of breath.   Objective:    General: Well Developed, well nourished, and in no acute distress.  Neuro: Alert and oriented x3, extra-ocular muscles intact, sensation grossly intact.  HEENT: Normocephalic, atraumatic, pupils equal round reactive to light, neck supple, no masses, no lymphadenopathy, thyroid nonpalpable.  Skin: Warm and dry, no rashes. Cardiac: Regular rate and rhythm, no murmurs rubs or gallops, no lower extremity edema.  Respiratory: Clear to auscultation bilaterally. Not using accessory muscles, speaking in full sentences. Right Knee: Tender to palpation medial and lateral patellar facets with positive patellar grind. ROM normal in flexion and extension and lower leg rotation. Ligaments with solid consistent endpoints including ACL, PCL, LCL, MCL. Negative Mcmurray's and provocative meniscal tests. painful patellar compression. Patellar and quadriceps tendons unremarkable. Hamstring and quadriceps strength is normal.  Procedure: Real-time Ultrasound Guided Injection of Right knee Device: GE Logiq E  Verbal informed consent obtained.  Time-out conducted.  Noted no overlying erythema, induration, or other signs of local infection.  Skin prepped in a sterile fashion.  Local anesthesia: Topical Ethyl chloride.  With sterile  technique and under real time ultrasound guidance:  2 mL Kenalog 40, 4 mL lidocaine injected easily into the suprapatellar recess, minimal effusion present. Completed without difficulty  Pain immediately resolved suggesting accurate placement of the medication.  Advised to call if fevers/chills, erythema, induration, drainage, or persistent bleeding.  Images permanently stored and available for review in the ultrasound unit.  Impression: Technically successful ultrasound guided injection.  Patient was fitted for a : standard, cushioned, semi-rigid orthotic. The orthotic was heated and afterward the patient stood on the orthotic blank positioned on the orthotic stand. The patient was positioned in subtalar neutral position and 10 degrees of ankle dorsiflexion in a weight bearing stance. After completion of molding, a stable base was applied to the orthotic blank. The blank was ground to a stable position for weight bearing. Size: 7 Base: [employer] and safety inspector and Padding: None The patient ambulated these, and they were very comfortable.  Impression and Recommendations:    I spent 40 minutes with this patient, greater than 50% was face-to-face time counseling regarding the above diagnoses, the above time was separate from the time spent doing the procedure/injection.

## 2014-11-21 NOTE — Assessment & Plan Note (Signed)
Follow-up PCP to discuss aggressive pharmacologic weight loss.

## 2014-12-09 ENCOUNTER — Encounter: Payer: Self-pay | Admitting: Family Medicine

## 2014-12-09 ENCOUNTER — Ambulatory Visit (INDEPENDENT_AMBULATORY_CARE_PROVIDER_SITE_OTHER): Payer: BLUE CROSS/BLUE SHIELD | Admitting: Family Medicine

## 2014-12-09 ENCOUNTER — Institutional Professional Consult (permissible substitution): Payer: BLUE CROSS/BLUE SHIELD | Admitting: Internal Medicine

## 2014-12-09 VITALS — BP 128/74 | HR 77 | Ht 64.0 in | Wt 293.0 lb

## 2014-12-09 DIAGNOSIS — E049 Nontoxic goiter, unspecified: Secondary | ICD-10-CM

## 2014-12-09 DIAGNOSIS — Z6841 Body Mass Index (BMI) 40.0 and over, adult: Secondary | ICD-10-CM | POA: Diagnosis not present

## 2014-12-09 DIAGNOSIS — R635 Abnormal weight gain: Secondary | ICD-10-CM | POA: Diagnosis not present

## 2014-12-09 MED ORDER — PHENTERMINE HCL 37.5 MG PO CAPS: 37.5000 mg | ORAL_CAPSULE | ORAL | Status: DC

## 2014-12-09 NOTE — Progress Notes (Signed)
   Subjective:    Patient ID: Stacey Castaneda, female    DOB: April 08, 1971, 44 y.o.   MRN: [id number]  HPI Here to discuss weigh loss medication. Says she has tried walking more and cooking more of her own food. Says that has helped in the past but her weight fluctuate a lot. She works a Designer, [employer]. Says feels more hungry than usual.  Has a hard time with portion control. Has tried reducing carbs and increasing protein in the past. She usually walks for exercise.  She said she took something years ago in her late teenage years help her lose weight and it caused insomnia it made her feel very anxious.  Follow-up goiter-her last ultrasound was about 2 years ago when she had a nodule biopsied. It was benign. But she feels like lately she's had some discomfort when swallowing. She denies food actually getting stuck her home which is notices more of a pressure sensation. She says her mom had a history of a very large goiter and she is concerned about that. She also wants to know how often she should actually have her thyroid levels checked. No recent weight changes or skin or hair changes.  Review of Systems     Objective:   Physical Exam  Constitutional: She is oriented to person, place, and time. She appears well-developed and well-nourished.  HENT:  Head: Normocephalic and atraumatic.  Cardiovascular: Normal rate, regular rhythm and normal heart sounds.   Pulmonary/Chest: Effort normal and breath sounds normal.  Neurological: She is alert and oriented to person, place, and time.  Skin: Skin is warm and dry.  Psychiatric: She has a normal mood and affect. Her behavior is normal.          Assessment & Plan:  Abnormal weight gain/BMI 50- we discussed options for weight loss. We went over all the current medications on the market and pros and cons and potential side effects and how they work. We also discussed referral to nutrition which I think would be extremely helpful. Also recommend that she  work on counting calories using a smart phone at such as my fitness pal. She will need follow-up in one month for blood pressure and weight check. She opted to start phentermine as it looks like her insurance will not cover any of the other weight loss medications. We'll start with 37.5 mg. We cannot decrease the dose if needed. She was also interested in [place] call her insurance company to check it out.  Goiter-since she's having some new symptoms and I recommend follow-up ultrasound as well as rechecking thyroid levels. She can go to the lab today and we will try to schedule the ultrasound sometime next week. She now lives in [name] so we'll schedule at [place].  Time spent 30 min, > 50% spent counseling about abnormal weight gain/BMI 50, goiter, discomfort swallowing.

## 2014-12-10 LAB — TSH: TSH: 1.293 u[IU]/mL (ref 0.350–4.500)

## 2014-12-10 LAB — T4, FREE: Free T4: 1.05 ng/dL (ref 0.80–1.80)

## 2014-12-13 ENCOUNTER — Ambulatory Visit: Payer: BLUE CROSS/BLUE SHIELD | Admitting: Family Medicine

## 2014-12-22 ENCOUNTER — Encounter: Payer: Self-pay | Admitting: Sports Medicine

## 2014-12-22 ENCOUNTER — Ambulatory Visit (INDEPENDENT_AMBULATORY_CARE_PROVIDER_SITE_OTHER): Payer: BLUE CROSS/BLUE SHIELD | Admitting: Sports Medicine

## 2014-12-22 VITALS — BP 138/78 | HR 97 | Ht 61.0 in | Wt 301.0 lb

## 2014-12-22 DIAGNOSIS — S43001D Unspecified subluxation of right shoulder joint, subsequent encounter: Secondary | ICD-10-CM

## 2014-12-22 DIAGNOSIS — E66813 Obesity, class 3: Secondary | ICD-10-CM

## 2014-12-22 DIAGNOSIS — M2241 Chondromalacia patellae, right knee: Secondary | ICD-10-CM

## 2014-12-22 NOTE — Assessment & Plan Note (Signed)
Excellent response to injection.

## 2014-12-22 NOTE — Assessment & Plan Note (Signed)
Has not yet started phentermine, her next month weight check will be with Dr. Madilyn Fireman, because I want to see her again in 2 months to recheck shoulder I can do her two-month weight check.

## 2014-12-22 NOTE — Progress Notes (Signed)
  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  Obesity: Has not yet started phentermine.  Past medical history, Surgical history, Family history not pertinant except as noted below, Social history, Allergies, and medications have been entered into the medical record, reviewed, and no changes needed.   Review of Systems: No fevers, chills, night sweats, weight loss, chest pain, or shortness of breath.   Objective:    General: Well Developed, well nourished, and in no acute distress.  Neuro: Alert and oriented x3, extra-ocular muscles intact, sensation grossly intact.  HEENT: Normocephalic, atraumatic, pupils equal round reactive to light, neck supple, no masses, no lymphadenopathy, thyroid nonpalpable.  Skin: Warm and dry, no rashes. Cardiac: Regular rate and rhythm, no murmurs rubs or gallops, no lower extremity edema.  Respiratory: Clear to auscultation bilaterally. Not using accessory muscles, speaking in full sentences.  Impression and Recommendations:    I spent 25 minutes with this patient, greater than 50% was face-to-face time counseling regarding the above diagnoses

## 2014-12-22 NOTE — Assessment & Plan Note (Signed)
Fantastic response with physical therapy so far, did have what sounds to be a partial subluxation event, but this is improving. I do think we need at least 2 more months of formal physical therapy.

## 2014-12-30 ENCOUNTER — Telehealth: Payer: Self-pay | Admitting: Family Medicine

## 2014-12-30 NOTE — Telephone Encounter (Signed)
Call patient: Stacey Castaneda her history of asthma we really need to get an up-to-date spirometry on her. Reaction received a letter from [employer] because they had not seen a claim for spirometry in one eye looked back did not see one on file for her. Please schedule at her convenience.

## 2015-01-02 NOTE — Telephone Encounter (Signed)
Left vm informing pt to call office to schedule a spirometry.Stacey Castaneda

## 2015-01-05 ENCOUNTER — Encounter: Payer: Self-pay | Admitting: Family Medicine

## 2015-01-06 ENCOUNTER — Ambulatory Visit: Payer: BLUE CROSS/BLUE SHIELD | Admitting: Family Medicine

## 2015-01-12 ENCOUNTER — Ambulatory Visit (INDEPENDENT_AMBULATORY_CARE_PROVIDER_SITE_OTHER): Payer: BLUE CROSS/BLUE SHIELD | Admitting: Obstetrics & Gynecology

## 2015-01-12 ENCOUNTER — Encounter: Payer: Self-pay | Admitting: Obstetrics & Gynecology

## 2015-01-12 VITALS — BP 160/90 | HR 84 | Resp 16 | Ht 61.0 in | Wt 294.0 lb

## 2015-01-12 DIAGNOSIS — Z1389 Encounter for screening for other disorder: Secondary | ICD-10-CM

## 2015-01-12 DIAGNOSIS — Z01419 Encounter for gynecological examination (general) (routine) without abnormal findings: Secondary | ICD-10-CM | POA: Diagnosis not present

## 2015-01-12 DIAGNOSIS — Z30431 Encounter for routine checking of intrauterine contraceptive device: Secondary | ICD-10-CM

## 2015-01-13 LAB — FOLLICLE STIMULATING HORMONE: FSH: 6.6 m[IU]/mL

## 2015-01-13 LAB — VARICELLA ZOSTER ANTIBODY, IGG: Varicella IgG: 625.7 Index — ABNORMAL HIGH (ref <135.00)

## 2015-01-13 NOTE — Progress Notes (Signed)
  Subjective:     Stacey Castaneda is a 44 y.o. female here for a routine exam.  Current complaints: sweating at night.  Enjoys Mirena and no menses.     Gynecologic History No LMP recorded. Patient is not currently having periods (Reason: IUD). Contraception: IUD Last Pap: 2014. Results were: normal Last mammogram: 2015. Results were: normal  Obstetric History OB History  Gravida Para Term Preterm AB SAB TAB Ectopic Multiple Living  0                  The following portions of the patient's history were reviewed and updated as appropriate: allergies, current medications, past family history, past medical history, past social history, past surgical history and problem list.  Review of Systems A comprehensive review of systems was negative. except for above in HPI   Objective:      Filed Vitals:   01/12/15 1558  BP: 160/90  Pulse: 84  Resp: 16  Height: 5\' 1"  (1.549 m)  Weight: 294 lb (133.358 kg)   Vitals:  WNL General appearance: alert, cooperative and no distress  HEENT: Normocephalic, without obvious abnormality, atraumatic Eyes: negative Throat: lips, mucosa, and tongue normal; teeth and gums normal  Respiratory: Clear to auscultation bilaterally  CV: Regular rate and rhythm  Breasts:  Normal appearance, no masses or tenderness, no nipple retraction or dimpling  GI: Soft, non-tender; bowel sounds normal; no masses,  no organomegaly  GU: External Genitalia:  Tanner V, no lesion Urethra:  No prolapse   Vagina: Pink, normal rugae, no blood or discharge  Cervix: No CMT, no lesion, strings seen  Uterus:  Normal size and contour, non tender (difficult to palpate due to habitus)  Adnexa: Normal, no masses, non tender (difficult to palpate due to habitus)  Musculoskeletal: No edema, redness or tenderness in the calves or thighs  Skin: No lesions or rash  Lymphatic: Axillary adenopathy: none    Psychiatric: Normal mood and behavior          Assessment:    Healthy female exam.   IUD Sweating   Plan:    Education reviewed: self breast exams and skin cancer screening. Contraception: IUD. Mammogram ordered. Follow up in: 1 year. [place]     Patient wants to [facility] if she has been exposed to varicella--titers drawn. Last pap was 2014--next pap at earliest 2017.

## 2015-01-16 ENCOUNTER — Telehealth: Payer: Self-pay | Admitting: *Deleted

## 2015-01-16 NOTE — Telephone Encounter (Signed)
-----   Message from Guss Bunde, MD sent at 01/15/2015  6:26 AM EDT ----- Call the patient and let them know their lab results are normal.  Thanks!! Fraser Din ient has been exposed to Varicella

## 2015-01-16 NOTE — Telephone Encounter (Signed)
Called pt to adv labs normal - LMOM for pt to rtn call if needed.

## 2015-01-20 ENCOUNTER — Ambulatory Visit: Payer: BLUE CROSS/BLUE SHIELD | Admitting: Family Medicine

## 2015-01-21 ENCOUNTER — Other Ambulatory Visit: Payer: Self-pay | Admitting: Family Medicine

## 2015-02-17 ENCOUNTER — Ambulatory Visit: Payer: BLUE CROSS/BLUE SHIELD | Admitting: Sports Medicine

## 2015-03-16 ENCOUNTER — Other Ambulatory Visit: Payer: Self-pay | Admitting: *Deleted

## 2015-03-16 MED ORDER — BUDESONIDE-FORMOTEROL FUMARATE 80-4.5 MCG/ACT IN AERO: INHALATION_SPRAY | RESPIRATORY_TRACT | Status: DC

## 2015-03-19 ENCOUNTER — Other Ambulatory Visit: Payer: Self-pay | Admitting: Family Medicine

## 2015-03-28 ENCOUNTER — Ambulatory Visit (INDEPENDENT_AMBULATORY_CARE_PROVIDER_SITE_OTHER): Payer: BLUE CROSS/BLUE SHIELD | Admitting: Family Medicine

## 2015-03-28 ENCOUNTER — Encounter: Payer: Self-pay | Admitting: Family Medicine

## 2015-03-28 VITALS — BP 134/81 | HR 80 | Temp 98.4°F | Resp 18 | Wt 293.7 lb

## 2015-03-28 DIAGNOSIS — J4530 Mild persistent asthma, uncomplicated: Secondary | ICD-10-CM

## 2015-03-28 DIAGNOSIS — J45909 Unspecified asthma, uncomplicated: Secondary | ICD-10-CM

## 2015-03-28 MED ORDER — ALBUTEROL SULFATE HFA 108 (90 BASE) MCG/ACT IN AERS: 2.0000 | INHALATION_SPRAY | RESPIRATORY_TRACT | Status: DC | PRN

## 2015-03-28 MED ORDER — FLUTICASONE PROPIONATE HFA 110 MCG/ACT IN AERO: 2.0000 | INHALATION_SPRAY | Freq: Two times a day (BID) | RESPIRATORY_TRACT | Status: DC

## 2015-03-28 NOTE — Progress Notes (Signed)
   Subjective:    Patient ID: Stacey Castaneda, female    DOB: [birth year], 44 y.o.   MRN: [id number]  HPI Here for spirometry . She's here today for further evaluation as her current diagnosis isunclear. She's been told she hd COPD in the past and shes also been told that she has asthma. She does have occasional episodes of bronchitis that tend to causea lo of reactive airway issue an she does seem to respond very well to albuterol. In fact afterher last bout we had started her on Symbicort and she felt much better. In fact she's been using it twice a day regularly and has not had to use er rescue inhaler forseveral weeks if not more than a month.She did use her Symbicort this morning. She is a former smoker.She smokes about 30 cigarettes per day for the look for 10 years bu quit 9 yearsago.   Review of Systems     Objective:   Physical Exam  Constitutional: She is oriented to person, place, and time. She appears well-developed and well-nourished.  HENT:  Head: Normocephalic and atraumatic.  Cardiovascular: Normal rate, regular rhythm and normal heart sounds.   Pulmonary/Chest: Effort normal and breath sounds normal.  Neurological: She is alert and oriented to person, place, and time.  Skin: Skin is warm and dry.  Psychiatric: She has a normal mood and affect. Her behavior is normal.          Assessment & Plan:  We reviewed spirometry results today. Her FVC was 97% and FEV1 of 99%. Ratio of 86%. tthis is considered a normal spirometry but interestnly she did have 13% improvement in FEV1 and that was actually afer using her Symbicort this morning. She alo hdan 11% improvement in FVCafter albuterol treatment. I still suspect that she has unerlying asthma. We discussed decreasing the Symbicort down to an inhaled corticosteroid since she has been on it for several months and has done well. Will switch to Flovent. Follow-up in 2-3 months and if she's doing well at that point we will take her  completely off and just try to use albuterol as needed. Refill sent to the pharmacy.

## 2015-03-29 ENCOUNTER — Ambulatory Visit: Payer: BLUE CROSS/BLUE SHIELD

## 2015-04-04 ENCOUNTER — Ambulatory Visit (HOSPITAL_BASED_OUTPATIENT_CLINIC_OR_DEPARTMENT_OTHER)
Admission: RE | Admit: 2015-04-04 | Discharge: 2015-04-04 | Disposition: A | Discharge status: Home / Self Care | Payer: BLUE CROSS/BLUE SHIELD | Source: Ambulatory Visit | Attending: Obstetrics & Gynecology | Admitting: Obstetrics & Gynecology

## 2015-04-04 DIAGNOSIS — Z1231 Encounter for screening mammogram for malignant neoplasm of breast: Secondary | ICD-10-CM | POA: Diagnosis not present

## 2015-04-04 DIAGNOSIS — Z01419 Encounter for gynecological examination (general) (routine) without abnormal findings: Secondary | ICD-10-CM

## 2015-04-25 ENCOUNTER — Ambulatory Visit (INDEPENDENT_AMBULATORY_CARE_PROVIDER_SITE_OTHER): Payer: BLUE CROSS/BLUE SHIELD | Admitting: Family Medicine

## 2015-04-25 ENCOUNTER — Encounter: Payer: Self-pay | Admitting: Family Medicine

## 2015-04-25 VITALS — BP 149/84 | HR 88 | Wt 292.0 lb

## 2015-04-25 DIAGNOSIS — R635 Abnormal weight gain: Secondary | ICD-10-CM | POA: Diagnosis not present

## 2015-04-25 DIAGNOSIS — IMO0002 Reserved for concepts with insufficient information to code with codable children: Secondary | ICD-10-CM

## 2015-04-25 DIAGNOSIS — R413 Other amnesia: Secondary | ICD-10-CM | POA: Diagnosis not present

## 2015-04-25 DIAGNOSIS — G4733 Obstructive sleep apnea (adult) (pediatric): Secondary | ICD-10-CM | POA: Diagnosis not present

## 2015-04-25 MED ORDER — PHENTERMINE HCL 15 MG PO CAPS: 15.0000 mg | ORAL_CAPSULE | ORAL | Status: DC

## 2015-04-25 NOTE — Progress Notes (Signed)
   Subjective:    Patient ID: Stacey Castaneda, female    DOB: 1971/03/20, 44 y.o.   MRN: [id number]  HPI   Here to discuss memory issues. Says at first she noticed small thinks like not remember a movie stars name in a certain movie or only remembering part of a a news story.  Now she is having a hard time finding words that she should know.  She says it happens about 3 times per month.  She has not related it to any specific cause or trigger. She admits she has not been sleeping well. They bought a new mattress about a year ago but unfortunately she doesn't sleep well on it and feels extremely hot and overheated. This causes her to wake up multiple times at night. In fact over the summer she gave up on using her CPAP as well but recently restarted it about a week and a half ago. She does note that she is still snoring some with the CPAP on. She also feels like her mood is a little low but she is not interested in taking medication for it at this point. We had tried several medications but couldn't quite find the right balance at this point she prefers to not be on medication. The memory issues have not impaired her work. Or interfered with her job.   Abnormal weight gain - she says the phentermine 37.5mg  caused insomnia. Sos she has been taking it sparingly over the last month and a half.  She is down about 1-1/2 pounds.  Review of Systems     Objective:   Physical Exam  Constitutional: She is oriented to person, place, and time. She appears well-developed and well-nourished.  HENT:  Head: Normocephalic and atraumatic.  Cardiovascular: Normal rate, regular rhythm and normal heart sounds.   Pulmonary/Chest: Effort normal and breath sounds normal.  Neurological: She is alert and oriented to person, place, and time.  Skin: Skin is warm and dry.  Psychiatric: She has a normal mood and affect. Her behavior is normal.          Assessment & Plan:  Memory issues-we discussed that there are  several things including sleep quality, stress levels, mood that can affect memory. We did do many mental status exam on her today and her score was 30 out of 30. She also had a normal 6 CIT today.  We discussed working on sleep quality and mood. She has been using her CPAP again more consistently. We will also work on mood as well.  Obstructive sleep apnea-right now we'll give her a few more weeks to get used to wearing her CPAP again but will likely need to increase her pressure since she is still snoring on it but she wants to hold off.  Abnormal weight gain/BMI 55-will continue the phentermine but we'll decrease her dose to 15 mg. New prescription sent to [facility] in [place]. Follow-up in one month for nurse blood pressure and weight check. Continue work on diet and exercise.

## 2015-05-03 ENCOUNTER — Encounter: Payer: Self-pay | Admitting: Family Medicine

## 2015-05-03 ENCOUNTER — Ambulatory Visit (INDEPENDENT_AMBULATORY_CARE_PROVIDER_SITE_OTHER): Payer: BLUE CROSS/BLUE SHIELD | Admitting: Family Medicine

## 2015-05-03 VITALS — BP 141/81 | HR 80 | Wt 292.0 lb

## 2015-05-03 DIAGNOSIS — L918 Other hypertrophic disorders of the skin: Secondary | ICD-10-CM | POA: Diagnosis not present

## 2015-05-03 MED ORDER — PHENTERMINE HCL 37.5 MG PO TABS: 37.5000 mg | ORAL_TABLET | Freq: Every day | ORAL | Status: DC

## 2015-05-03 NOTE — Progress Notes (Signed)
   Subjective:    Patient ID: Stacey Castaneda, female    DOB: 1970-08-23, 44 y.o.   MRN: [id number]  HPI Patient comes in today to have some skin tags removed. She has 2 under the right axilla and 1 under the left axilla, one at the left groin increased, one on her posterior back and several smaller skin tags around her neck.   Review of Systems     Objective:   Physical Exam        Assessment & Plan:    Skin Tag Removal Procedure Note  Pre-operative Diagnosis: Classic skin tags (acrochordon)  Post-operative Diagnosis: Classic skin tags (acrochordon)  Locations: Patient comes in today to have some skin tags removed. She has 2 under the right axilla and 1 under the left axilla, one at the left groin increased, one on her posterior back and several smaller skin tags around her neck. The one on her side was treated with cryotherapy since it was more flat.   Indications: Irritation   Anesthesia: not used.   Procedure Details  The risks (including bleeding and infection) and benefits of the procedure and Verbal informed consent obtained. Using sterile iris scissors, multiple skin tags were snipped off at their bases after cleansing with Betadine.  Bleeding was controlled by pressure and aluminum chloride.    Findings: Pathognomonic benign lesions  not sent for pathological exam.  Condition: Stable  Complications: none.  Plan: 1. Instructed to keep the wounds dry and covered for 24-48h and clean thereafter. 2. Warning signs of infection were reviewed.   3. Recommended that the patient use OTC acetaminophen as needed for pain.  4. Return as needed.

## 2015-05-29 ENCOUNTER — Encounter: Payer: Self-pay | Admitting: Family Medicine

## 2015-05-29 ENCOUNTER — Ambulatory Visit (INDEPENDENT_AMBULATORY_CARE_PROVIDER_SITE_OTHER): Payer: BLUE CROSS/BLUE SHIELD | Admitting: Family Medicine

## 2015-05-29 VITALS — BP 142/74 | HR 91 | Temp 98.5°F | Wt 290.0 lb

## 2015-05-29 DIAGNOSIS — J209 Acute bronchitis, unspecified: Secondary | ICD-10-CM | POA: Diagnosis not present

## 2015-05-29 DIAGNOSIS — Z6841 Body Mass Index (BMI) 40.0 and over, adult: Secondary | ICD-10-CM | POA: Diagnosis not present

## 2015-05-29 DIAGNOSIS — R635 Abnormal weight gain: Secondary | ICD-10-CM

## 2015-05-29 DIAGNOSIS — J019 Acute sinusitis, unspecified: Secondary | ICD-10-CM | POA: Diagnosis not present

## 2015-05-29 MED ORDER — PREDNISONE 20 MG PO TABS: 40.0000 mg | ORAL_TABLET | Freq: Every day | ORAL | Status: DC

## 2015-05-29 MED FILL — predniSONE 20 MG TABS: 20 | 5 days supply | Qty: 10 | Fill #0

## 2015-05-29 NOTE — Progress Notes (Signed)
   Subjective:    Patient ID: Stacey Castaneda, female    DOB: 1971/01/19, 45 y.o.   MRN: [id number]  HPI  2 weeks ago had viral gastroenteritis and then by 2/14 ( about 10 days ago) has had cough and ST.  Has had some sinus congestion and post nasal drip.  Restarted her symbicort about 4 days ago.  Sputum has a milkly yellow.  ST is better. She still has a cough and a short of breath at times but overall is feeling better. He has been using the Symbicort and feels like this does help.  Abnormal weight gain - she is doing well on the phentermine.  Says hasn't taking it for about 2 weeks after the stomach bug.  She has felt more tired. No CP or SOB.  Sometimes affects her sleep.  She is splitting her tab in half.     OSA - was able to get a new mask and the snoring has resolved.   Review of Systems     Objective:   Physical Exam  Constitutional: She is oriented to person, place, and time. She appears well-developed and well-nourished.  HENT:  Head: Normocephalic and atraumatic.  Right Ear: External ear normal.  Left Ear: External ear normal.  Nose: Nose normal.  Mouth/Throat: Oropharynx is clear and moist.  TMs and canals are clear.   Eyes: Conjunctivae and EOM are normal. Pupils are equal, round, and reactive to light.  Neck: Neck supple. No thyromegaly present.  Cardiovascular: Normal rate, regular rhythm and normal heart sounds.   Pulmonary/Chest: Effort normal and breath sounds normal. She has no wheezes.  Diffuse coarse on BS  Lymphadenopathy:    She has no cervical adenopathy.  Neurological: She is alert and oriented to person, place, and time.  Skin: Skin is warm and dry.  Psychiatric: She has a normal mood and affect.          Assessment & Plan:  Acute bronchitissinusitis - likely viral. Call if not improving or suddenly worse. Refilled her Symbicort today.  Abnormal weight gain - and kidney with half tab of phentermine. Follow-up in 4-6 weeks for blood pressure and  weight check.  OSA - CPAP seem to be working better. She is tolerating it well and using it more consistently.

## 2015-05-30 ENCOUNTER — Encounter: Payer: Self-pay | Admitting: Family Medicine

## 2015-06-20 ENCOUNTER — Ambulatory Visit (INDEPENDENT_AMBULATORY_CARE_PROVIDER_SITE_OTHER): Payer: BLUE CROSS/BLUE SHIELD | Admitting: Osteopathic Medicine

## 2015-06-20 ENCOUNTER — Encounter: Payer: Self-pay | Admitting: Osteopathic Medicine

## 2015-06-20 VITALS — BP 143/77 | HR 110 | Temp 100.2°F | Wt 292.0 lb

## 2015-06-20 DIAGNOSIS — R52 Pain, unspecified: Secondary | ICD-10-CM

## 2015-06-20 DIAGNOSIS — J09X2 Influenza due to identified novel influenza A virus with other respiratory manifestations: Secondary | ICD-10-CM

## 2015-06-20 LAB — POCT INFLUENZA A/B
INFLUENZA A, POC: POSITIVE — AB
Influenza B, POC: NEGATIVE

## 2015-06-20 MED ORDER — IPRATROPIUM BROMIDE 0.03 % NA SOLN
2.0000 | Freq: Two times a day (BID) | NASAL | Status: DC
Start: 2015-06-20 — End: 2015-07-13

## 2015-06-20 MED ORDER — HYDROCODONE-HOMATROPINE 5-1.5 MG/5ML PO SYRP: 5.0000 mL | ORAL_SOLUTION | ORAL | Status: DC | PRN

## 2015-06-20 MED ORDER — ONDANSETRON 8 MG PO TBDP: 8.0000 mg | ORAL_TABLET | Freq: Three times a day (TID) | ORAL | Status: DC | PRN

## 2015-06-20 MED ORDER — IPRATROPIUM BROMIDE 0.03 % NA SOLN
2.0000 | Freq: Two times a day (BID) | NASAL | Status: DC
Start: 2015-06-20 — End: 2015-06-20

## 2015-06-20 MED ORDER — OSELTAMIVIR PHOSPHATE 75 MG PO CAPS: 75.0000 mg | ORAL_CAPSULE | Freq: Two times a day (BID) | ORAL | Status: DC

## 2015-06-20 MED FILL — HYDROCODONE-HOMATROPINE SYR: 5-1.5 | 6 days supply | Qty: 180 | Fill #0

## 2015-06-20 MED FILL — OSELTAMIVIR PHOS 75 MG CAP: 75 | 5 days supply | Qty: 10 | Fill #0

## 2015-06-20 NOTE — Progress Notes (Signed)
HPI: Stacey Castaneda is a 45 y.o. female who presents to [facility]  today for chief complaint of:  Chief Complaint  Patient presents with  . Fever  . Cough    . Location: generalized . Quality: fever/cough . Duration: <48 hours  . Context: yes sick contacts, no recent travel . Modifying factors: has tried the following OTC medications: nYqUIL  without relief . Assoc signs/symptoms: HTN as below.    Past medical, social and family history reviewed: Past Medical History  Diagnosis Date  . Anxiety   . Hypertension   . Depression   . Perianal fistula   . PONV (postoperative nausea and vomiting)   . Seasonal allergic rhinitis   . Thyroid goiter     BENIGN- remains "tested negative"  . Wears contact lenses   . Vitreous floaters of left eye     evaluated-not always an issue  . Poor venous access     01-26-14 "states having PICC line placed on 01-27-14"  . Perirectal abscess 10/2012   Past Surgical History  Procedure Laterality Date  . Wisdom tooth extraction  AGE 28  . Incision and drainage perirectal abscess N/A 10/30/2012    Procedure: peri rectal INCISION AND DRAINAGE ABSCESS;  Surgeon: Earnstine Regal, MD;  Location: [facility];  Service: General;  Laterality: N/A;  . Hysteroscopy w/d&c  08-22-2005  . Dilatation of cervix/  removal and replacement mirena  10-22-2010  . Dilation and curettage of uterus  1990's  . Evaluation under anesthesia with anal fistulectomy N/A 09/03/2013    Procedure: EXAM UNDER ANESTHESIA ;  Surgeon: Leighton Ruff, MD;  Location: [facility];  Service: General;  Laterality: N/A;  . Placement of seton N/A 09/03/2013    Procedure: PLACEMENT OF SETON;  Surgeon: Leighton Ruff, MD;  Location: [facility];  Service: General;  Laterality: N/A;  . Anal fistulectomy N/A 01/28/2014    Procedure: LIGATION OF INTERNAL FISTULA TRACT, REMOVAL OF SETON ;  Surgeon: Leighton Ruff, MD;  Location: [facility];  Service: General;  Laterality: N/A;   Social  History  Substance Use Topics  . Smoking status: Former Smoker -- 1.50 packs/day for 10 years    Types: Cigarettes    Quit date: 10/31/2006  . Smokeless tobacco: Never Used  . Alcohol Use: Yes     Comment: RARE   Family History  Problem Relation Age of Onset  . Diabetes Father   . Heart disease Father   . Hypertension Father   . Diabetes Mother   . Hypertension Mother   . Lung cancer Mother   . Breast cancer Maternal Grandmother     Current Outpatient Prescriptions  Medication Sig Dispense Refill  . albuterol (PROVENTIL HFA;VENTOLIN HFA) 108 (90 BASE) MCG/ACT inhaler Inhale 2 puffs into the lungs every 4 (four) hours as needed for wheezing or shortness of breath. 2 Inhaler 1  . cetirizine (ZYRTEC) 10 MG tablet Take 10 mg by mouth every morning.     . cyanocobalamin 1000 MCG tablet Take 100 mcg by mouth daily.    Marland Kitchen docusate sodium (COLACE) 100 MG capsule Take 100 mg by mouth 2 (two) times daily.    . fluticasone (FLONASE) 50 MCG/ACT nasal spray Place 2 sprays into both nostrils every morning. Use two sprays in each nostril daily    . losartan (COZAAR) 100 MG tablet Take 1 tablet (100 mg total) by mouth daily. Patient member [id number] 90 tablet 3  . Multiple Vitamins-Minerals (MULTIVITAMIN GUMMIES ADULT PO)  Take 2 each by mouth every morning.    . Omega-3 Fatty Acids (FISH OIL PO) Take by mouth.    Marland Kitchen OVER THE COUNTER MEDICATION Place 1-2 drops into both eyes once as needed (dry eyes/ contacts.).    Marland Kitchen phentermine (ADIPEX-P) 37.5 MG tablet Take 1 tablet (37.5 mg total) by mouth daily before breakfast. 30 tablet 0  . SYMBICORT 80-4.5 MCG/ACT inhaler Reported on 05/29/2015    . predniSONE (DELTASONE) 20 MG tablet Take 2 tablets (40 mg total) by mouth daily. (Patient not taking: Reported on 06/20/2015) 10 tablet 0   No current facility-administered medications for this visit.   Allergies  Allergen Reactions  . Lisinopril Cough      Review of Systems: CONSTITUTIONAL: yes  fever/chills HEAD/EYES/EARS/NOSE/THROAT: yes headache, no vision change or hearing change, yes sore throat CARDIAC: No chest pain/pressure/palpitations, no orthopnea RESPIRATORY: yes cough, no shortness of breath GASTROINTESTINAL: yes nausea, no vomiting, no abdominal pain/blood in stool/diarrhea/constipation MUSCULOSKELETAL: yes myalgia/arthralgia   Exam:  BP 143/77 mmHg  Pulse 110  Temp(Src) 100.2 F (37.9 C) (Oral)  Wt 292 lb (132.45 kg) Constitutional: VSS, see above. General Appearance: alert, well-developed, well-nourished, NAD Eyes: Normal lids and conjunctive, non-icteric sclera, PERRLA Ears, Nose, Mouth, Throat: Normal external inspection ears/nares/mouth/lips/gums, normal TM, MMM; posterior pharynx without erythema, without exudate Neck: No masses, trachea midline. No thyroid enlargement/tenderness/mass appreciated, normal lymph nodes Respiratory: Normal respiratory effort. No  wheeze/rhonchi/rales Cardiovascular: S1/S2 normal, no murmur/rub/gallop auscultated. RRR. No carotid bruit or JVD. No lower extremity edema.   Results for orders placed or performed in visit on 06/20/15 (from the past 72 hour(s))  POCT Influenza A/B     Status: Abnormal   Collection Time: 06/20/15  2:58 PM  Result Value Ref Range   Influenza A, POC Positive (A) Negative   Influenza B, POC Negative Negative      ASSESSMENT/PLAN: Influenza (+), tamiflu offered pt not sure about side effects but Rx printed and she can discuss further with pharmacist, info printed. RTC/ER precautions reviewed. SBP initially >170 better on recheck, advised avoid decongestants and NSAID, stop Phentermine. F/u Dr Madilyn Fireman to ensure BP improved.   Influenza due to identified novel influenza A virus with other respiratory manifestations - Plan: oseltamivir (TAMIFLU) 75 MG capsule, HYDROcodone-homatropine (HYCODAN) 5-1.5 MG/5ML syrup, ipratropium (ATROVENT) 0.03 % nasal spray, ondansetron (ZOFRAN-ODT) 8 MG disintegrating  tablet, DISCONTINUED: ondansetron (ZOFRAN-ODT) 8 MG disintegrating tablet, DISCONTINUED: ipratropium (ATROVENT) 0.03 % nasal spray  Body aches - Plan: POCT Influenza A/B    Return in about 1 week (around 06/27/2015), or sooner if needed, for blod pressure followup with Dr Madilyn Fireman.

## 2015-06-20 NOTE — Patient Instructions (Addendum)
TYLENOL (ACETAMINOPHEN) - [phone] mg every 6 hours.  You've been given prescriptions for  1. cough medicine 2. a nasal spray to help with congestion 3. Nausea medication to take as needed 4. oseltamivir - this is an antiviral shown to be helpful in reducing the duration of flu illness. See information below and if you decide to fill this medicine go ahead and do so, or you can ask your pharmacist for more information before you decide, but this medicine must be started  I recommend you schedule a follow up visit with Dr Madilyn Fireman in 1 week to ensure your blood pressure is controlled.  Please schedule a visit sooner or go to urgent care/ER if you are doing worse.     Influenza, Adult Influenza ("the flu") is a viral infection of the respiratory tract. It occurs more often in winter months because people spend more time in close contact with one another. Influenza can make you feel very sick. Influenza easily spreads from person to person (contagious). CAUSES  Influenza is caused by a virus that infects the respiratory tract. You can catch the virus by breathing in droplets from an infected person's cough or sneeze. You can also catch the virus by touching something that was recently contaminated with the virus and then touching your mouth, nose, or eyes. RISKS AND COMPLICATIONS You may be at risk for a more severe case of influenza if you smoke cigarettes, have diabetes, have chronic heart disease (such as heart failure) or lung disease (such as asthma), or if you have a weakened immune system. Elderly people and pregnant women are also at risk for more serious infections. The most common problem of influenza is a lung infection (pneumonia). Sometimes, this problem can require emergency medical care and may be life threatening. SIGNS AND SYMPTOMS  Symptoms typically last 4 to 10 days and may include:  Fever.  Chills.  Headache, body aches, and muscle aches.  Sore throat.  Chest discomfort  and cough.  Poor appetite.  Weakness or feeling tired.  Dizziness.  Nausea or vomiting. DIAGNOSIS  Diagnosis of influenza is often made based on your history and a physical exam. A nose or throat swab test can be done to confirm the diagnosis. TREATMENT  In mild cases, influenza goes away on its own. Treatment is directed at relieving symptoms. For more severe cases, your health care provider may prescribe antiviral medicines to shorten the sickness. Antibiotic medicines are not effective because the infection is caused by a virus, not by bacteria. HOME CARE INSTRUCTIONS  Take medicines only as directed by your health care provider.  Use a cool mist humidifier to make breathing easier.  Get plenty of rest until your temperature returns to normal. This usually takes 3 to 4 days.  Drink enough fluid to keep your urine clear or pale yellow.  Cover yourmouth and nosewhen coughing or sneezing,and wash your handswellto prevent thevirusfrom spreading.  Stay homefromwork orschool untilthe fever is gonefor at least 66full day. PREVENTION  An annual influenza vaccination (flu shot) is the best way to avoid getting influenza. An annual flu shot is now routinely recommended for all adults in the [place] IF:  You experiencechest pain, yourcough worsens,or you producemore mucus.  Youhave nausea,vomiting, ordiarrhea.  Your fever returns or gets worse. SEEK IMMEDIATE MEDICAL CARE IF:  You havetrouble breathing, you become short of breath,or your skin ornails becomebluish.  You have severe painor stiffnessin the neck.  You develop a sudden headache, or pain  in the face or ear.  You have nausea or vomiting that you cannot control. MAKE SURE YOU:   Understand these instructions.  Will watch your condition.  Will get help right away if you are not doing well or get worse.   This information is not intended to replace advice given to you by your  health care provider. Make sure you discuss any questions you have with your health care provider.   Document Released: 04/19/2000 Document Revised: 05/13/2014 Document Reviewed: 07/22/2011 Elsevier Interactive Patient Education 2016 Elsevier Inc.    Oseltamivir capsules What is this medicine? OSELTAMIVIR (os el TAM i vir) is an antiviral medicine. It is used to prevent and to treat some kinds of influenza or the flu. It will not work for colds or other viral infections. This medicine may be used for other purposes; ask your health care provider or pharmacist if you have questions. What should I tell my health care provider before I take this medicine? They need to know if you have any of the following conditions: -heart disease -immune system problems -kidney disease -liver disease -lung disease -an unusual or allergic reaction to oseltamivir, other medicines, foods, dyes, or preservatives -pregnant or trying to get pregnant -breast-feeding How should I use this medicine? Take this medicine by mouth with a glass of water. Follow the directions on the prescription label. Start this medicine at the first sign of flu symptoms. You can take it with or without food. If it upsets your stomach, take it with food. Take your medicine at regular intervals. Do not take your medicine more often than directed. Take all of your medicine as directed even if you think you are better. Do not skip doses or stop your medicine early. Talk to your pediatrician regarding the use of this medicine in children. While this drug may be prescribed for children as young as 14 days for selected conditions, precautions do apply. Overdosage: If you think you have taken too much of this medicine contact a poison control center or emergency room at once. NOTE: This medicine is only for you. Do not share this medicine with others. What if I miss a dose? If you miss a dose, take it as soon as you remember. If it is almost  time for your next dose (within 2 hours), take only that dose. Do not take double or extra doses. What may interact with this medicine? Interactions are not expected. This list may not describe all possible interactions. Give your health care provider a list of all the medicines, herbs, non-prescription drugs, or dietary supplements you use. Also tell them if you smoke, drink alcohol, or use illegal drugs. Some items may interact with your medicine. What should I watch for while using this medicine? Visit your doctor or health care professional for regular check ups. Tell your doctor if your symptoms do not start to get better or if they get worse. If you have the flu, you may be at an increased risk of developing seizures, confusion, or abnormal behavior. This occurs early in the illness, and more frequently in children and teens. These events are not common, but may result in accidental injury to the patient. Families and caregivers of patients should watch for signs of unusual behavior and contact a doctor or health care professional right away if the patient shows signs of unusual behavior. This medicine is not a substitute for the flu shot. Talk to your doctor each year about an annual flu shot. What  side effects may I notice from receiving this medicine? Side effects that you should report to your doctor or health care professional as soon as possible: -allergic reactions like skin rash, itching or hives, swelling of the face, lips, or tongue -anxiety, confusion, unusual behavior -breathing problems -hallucination, loss of contact with reality -redness, blistering, peeling or loosening of the skin, including inside the mouth -seizures Side effects that usually do not require medical attention (report to your doctor or health care professional if they continue or are bothersome): -diarrhea -headache -nausea, vomiting -pain This list may not describe all possible side effects. Call your  doctor for medical advice about side effects. You may report side effects to FDA at 1-800-FDA-1088. Where should I keep my medicine? Keep out of the reach of children. Store at room temperature between 15 and 30 degrees C (59 and 86 degrees F). Throw away any unused medicine after the expiration date. NOTE: This sheet is a summary. It may not cover all possible information. If you have questions about this medicine, talk to your doctor, pharmacist, or health care provider.    2016, Elsevier/Gold Standard. (2014-10-26 10:50:39)

## 2015-06-29 ENCOUNTER — Encounter: Payer: Self-pay | Admitting: Family Medicine

## 2015-06-29 ENCOUNTER — Ambulatory Visit (INDEPENDENT_AMBULATORY_CARE_PROVIDER_SITE_OTHER): Payer: BLUE CROSS/BLUE SHIELD | Admitting: Family Medicine

## 2015-06-29 ENCOUNTER — Ambulatory Visit: Payer: BLUE CROSS/BLUE SHIELD | Admitting: Family Medicine

## 2015-06-29 VITALS — BP 124/82 | HR 98 | Wt 292.0 lb

## 2015-06-29 DIAGNOSIS — I10 Essential (primary) hypertension: Secondary | ICD-10-CM

## 2015-06-29 DIAGNOSIS — L02419 Cutaneous abscess of limb, unspecified: Secondary | ICD-10-CM | POA: Diagnosis not present

## 2015-06-29 DIAGNOSIS — J4531 Mild persistent asthma with (acute) exacerbation: Secondary | ICD-10-CM

## 2015-06-29 MED ORDER — PREDNISONE 20 MG PO TABS: 40.0000 mg | ORAL_TABLET | Freq: Every day | ORAL | Status: DC

## 2015-06-29 MED ORDER — HYDROCHLOROTHIAZIDE 25 MG PO TABS: 25.0000 mg | ORAL_TABLET | Freq: Every day | ORAL | Status: DC

## 2015-06-29 MED ORDER — AMOXICILLIN-POT CLAVULANATE 875-125 MG PO TABS: 1.0000 | ORAL_TABLET | Freq: Two times a day (BID) | ORAL | Status: DC

## 2015-06-29 MED ORDER — HYDROCOD POLST-CPM POLST ER 10-8 MG/5ML PO SUER: 5.0000 mL | Freq: Every evening | ORAL | Status: DC | PRN

## 2015-06-29 MED FILL — AMOX-CLAV 875-125 MG TABLET: 875-125 | 10 days supply | Qty: 20 | Fill #0

## 2015-06-29 MED FILL — predniSONE 20 MG TABS: 20 | 5 days supply | Qty: 10 | Fill #0

## 2015-06-29 MED FILL — HYDROCHLOROTHIAZIDE 25 MG T: 25 | 90 days supply | Qty: 90 | Fill #0

## 2015-06-29 MED FILL — HYDROCODONE-CHLORPHENIRAM S: 10-8 | 28 days supply | Qty: 140 | Fill #0

## 2015-06-29 NOTE — Addendum Note (Signed)
Addended by: Teddy Spike on: 06/29/2015 05:00 PM   Modules accepted: Orders

## 2015-06-29 NOTE — Progress Notes (Addendum)
Subjective:    Patient ID: Stacey Castaneda, female    DOB: 1970-09-18, 45 y.o.   MRN: [id number]  HPI   Dx with flu 10 days ago. She did finish the tamiflu 5 days ago. Still coughing a lot. Mostly clear sputum. Still feels congested in her nose thought the pressure is better. She has had excess drainage.  Has gone through 3 boxes of Kleenex. Feels like the cough med isnt' working well.  No fever, chills, etc.  Still fatigued and still having coughing fits.  Hx of reactive airway dz.   Hypertension- Pt denies chest pain, SOB, dizziness, or heart palpitations.  Taking meds as directed w/o problems.  Denies medication side effects.  He is currently on losartan.  Also has an abscess  On her right inner thigh. She says it started over a week ago and was originally the size of a baseball. It has actually come down quite a bit and is about a quarter of the size it was previously. It did not drain. It's still feels somewhat tender but not as much as it was previously. No red streaking or fevers.   Review of Systems  BP 154/87 mmHg  Pulse 98  Wt 292 lb (132.45 kg)  SpO2 98%    Allergies  Allergen Reactions  . Lisinopril Cough    Past Medical History  Diagnosis Date  . Anxiety   . Hypertension   . Depression   . Perianal fistula   . PONV (postoperative nausea and vomiting)   . Seasonal allergic rhinitis   . Thyroid goiter     BENIGN- remains "tested negative"  . Wears contact lenses   . Vitreous floaters of left eye     evaluated-not always an issue  . Poor venous access     01-26-14 "states having PICC line placed on 01-27-14"  . Perirectal abscess 10/2012    Past Surgical History  Procedure Laterality Date  . Wisdom tooth extraction  AGE 51  . Incision and drainage perirectal abscess N/A 10/30/2012    Procedure: peri rectal INCISION AND DRAINAGE ABSCESS;  Surgeon: Earnstine Regal, MD;  Location: [facility];  Service: General;  Laterality: N/A;  . Hysteroscopy w/d&c  08-22-2005  .  Dilatation of cervix/  removal and replacement mirena  10-22-2010  . Dilation and curettage of uterus  1990's  . Evaluation under anesthesia with anal fistulectomy N/A 09/03/2013    Procedure: EXAM UNDER ANESTHESIA ;  Surgeon: Leighton Ruff, MD;  Location: [facility];  Service: General;  Laterality: N/A;  . Placement of seton N/A 09/03/2013    Procedure: PLACEMENT OF SETON;  Surgeon: Leighton Ruff, MD;  Location: [facility];  Service: General;  Laterality: N/A;  . Anal fistulectomy N/A 01/28/2014    Procedure: LIGATION OF INTERNAL FISTULA TRACT, REMOVAL OF SETON ;  Surgeon: Leighton Ruff, MD;  Location: [facility];  Service: General;  Laterality: N/A;    Social History   Social History  . Marital Status: Married    Spouse Name: N/A  . Number of Children: N/A  . Years of Education: N/A   Occupational History  . Software Testor    Social History Main Topics  . Smoking status: Former Smoker -- 1.50 packs/day for 10 years    Types: Cigarettes    Quit date: 10/31/2006  . Smokeless tobacco: Never Used  . Alcohol Use: Yes     Comment: RARE  . Drug Use: No  . Sexual Activity: Yes    Birth  Control/ Protection: IUD   Other Topics Concern  . Not on file   Social History Narrative    Family History  Problem Relation Age of Onset  . Diabetes Father   . Heart disease Father   . Hypertension Father   . Diabetes Mother   . Hypertension Mother   . Lung cancer Mother   . Breast cancer Maternal Grandmother     Outpatient Encounter Prescriptions as of 06/29/2015  Medication Sig  . albuterol (PROVENTIL HFA;VENTOLIN HFA) 108 (90 BASE) MCG/ACT inhaler Inhale 2 puffs into the lungs every 4 (four) hours as needed for wheezing or shortness of breath.  . cetirizine (ZYRTEC) 10 MG tablet Take 10 mg by mouth every morning.   . cyanocobalamin 1000 MCG tablet Take 100 mcg by mouth daily.  Marland Kitchen docusate sodium (COLACE) 100 MG capsule Take 100 mg by mouth 2 (two) times daily.  . fluticasone (FLONASE) 50 MCG/ACT  nasal spray Place 2 sprays into both nostrils every morning. Use two sprays in each nostril daily  . ipratropium (ATROVENT) 0.03 % nasal spray Place 2 sprays into both nostrils every 12 (twelve) hours.  Marland Kitchen losartan (COZAAR) 100 MG tablet Take 1 tablet (100 mg total) by mouth daily. Patient member [id number]  . Multiple Vitamins-Minerals (MULTIVITAMIN GUMMIES ADULT PO) Take 2 each by mouth every morning.  . Omega-3 Fatty Acids (FISH OIL PO) Take by mouth.  Marland Kitchen OVER THE COUNTER MEDICATION Place 1-2 drops into both eyes once as needed (dry eyes/ contacts.).  Marland Kitchen phentermine (ADIPEX-P) 37.5 MG tablet Take 1 tablet (37.5 mg total) by mouth daily before breakfast.  . SYMBICORT 80-4.5 MCG/ACT inhaler Reported on 05/29/2015  . amoxicillin-clavulanate (AUGMENTIN) 875-125 MG tablet Take 1 tablet by mouth 2 (two) times daily.  . chlorpheniramine-HYDROcodone (TUSSIONEX PENNKINETIC ER) 10-8 MG/5ML SUER Take 5 mLs by mouth at bedtime as needed for cough.  . hydrochlorothiazide (HYDRODIURIL) 25 MG tablet Take 1 tablet (25 mg total) by mouth daily.  . predniSONE (DELTASONE) 20 MG tablet Take 2 tablets (40 mg total) by mouth daily.  . [DISCONTINUED] HYDROcodone-homatropine (HYCODAN) 5-1.5 MG/5ML syrup Take 5 mLs by mouth every 4 (four) hours as needed for cough.  . [DISCONTINUED] ondansetron (ZOFRAN-ODT) 8 MG disintegrating tablet Take 1 tablet (8 mg total) by mouth every 8 (eight) hours as needed for nausea.  . [DISCONTINUED] oseltamivir (TAMIFLU) 75 MG capsule Take 1 capsule (75 mg total) by mouth 2 (two) times daily.   No facility-administered encounter medications on file as of 06/29/2015.          Objective:   Physical Exam  Constitutional: She is oriented to person, place, and time. She appears well-developed and well-nourished.  HENT:  Head: Normocephalic and atraumatic.  Right Ear: External ear normal.  Left Ear: External ear normal.  Nose: Nose normal.  Mouth/Throat: Oropharynx is clear and moist.   TMs and canals are clear.   Eyes: Conjunctivae and EOM are normal. Pupils are equal, round, and reactive to light.  Neck: Neck supple. No thyromegaly present.  Cardiovascular: Normal rate, regular rhythm and normal heart sounds.   Pulmonary/Chest: Effort normal and breath sounds normal. She has no wheezes.  Diffuse rhonchi throughout both lungs. No crackles  Lymphadenopathy:    She has no cervical adenopathy.  Neurological: She is alert and oriented to person, place, and time.  Skin: Skin is warm and dry.  She has a 2 x 4 cm oval shaped abscess with some purple discoloration on the right inner thigh.  Psychiatric: She has a normal mood and affect.        Assessment & Plan:  HTN - uncontrolled that she is still quite sick. We discussed adding HCTZ to her losartan. New prescription written. If she does well then we can change to the combination therapy. Recheck BP in 2 weeks.   Influenza with asthma exacerbation-she still has a fair amount of diffuse rhonchi on lung exam and has difficulty speaking without coughing. The go ahead and put her on 5 days of prednisone as well as Augmentin. We'll also try switching cough medication since the Hycodan is not working very well for her.  Recheck lungs in 2 weeks.    Abscess right inner thigh-infectious seems to begin smaller on its own even though it has not drained. Continue to keep an eye on it through the weekend. If it's not completely going away or suddenly gets larger than come back in.

## 2015-06-30 ENCOUNTER — Ambulatory Visit: Payer: BLUE CROSS/BLUE SHIELD

## 2015-07-13 ENCOUNTER — Ambulatory Visit (INDEPENDENT_AMBULATORY_CARE_PROVIDER_SITE_OTHER): Payer: BLUE CROSS/BLUE SHIELD | Admitting: Family Medicine

## 2015-07-13 ENCOUNTER — Encounter: Payer: Self-pay | Admitting: Family Medicine

## 2015-07-13 VITALS — BP 128/78 | HR 105 | Wt 289.0 lb

## 2015-07-13 DIAGNOSIS — H5712 Ocular pain, left eye: Secondary | ICD-10-CM | POA: Diagnosis not present

## 2015-07-13 DIAGNOSIS — J45909 Unspecified asthma, uncomplicated: Secondary | ICD-10-CM

## 2015-07-13 DIAGNOSIS — L02214 Cutaneous abscess of groin: Secondary | ICD-10-CM

## 2015-07-13 DIAGNOSIS — I10 Essential (primary) hypertension: Secondary | ICD-10-CM

## 2015-07-13 DIAGNOSIS — J09X2 Influenza due to identified novel influenza A virus with other respiratory manifestations: Secondary | ICD-10-CM

## 2015-07-13 DIAGNOSIS — J45998 Other asthma: Secondary | ICD-10-CM | POA: Diagnosis not present

## 2015-07-13 MED ORDER — MONTELUKAST SODIUM 10 MG PO TABS: 10.0000 mg | ORAL_TABLET | Freq: Every day | ORAL | Status: DC

## 2015-07-13 MED ORDER — IPRATROPIUM BROMIDE 0.03 % NA SOLN: 2.0000 | Freq: Two times a day (BID) | NASAL | Status: DC

## 2015-07-13 MED FILL — MONTELUKAST SOD 10 MG TAB: 10 | 30 days supply | Qty: 30 | Fill #0

## 2015-07-13 NOTE — Progress Notes (Signed)
   Subjective:    Patient ID: Stacey Castaneda, female    DOB: 30-May-1970, 45 y.o.   MRN: [id number]  HPI Hypertension- We added a diuretic back when I last saw her to get better control of her blood pressure. She is tolerating it well without any side effects. She has noticed a little bit more dry mouth at night when she is wearing her CPAP .  Pt denies chest pain, SOB, dizziness, or heart palpitations.  Taking meds as directed w/o problems.  Denies medication side effects.    Asthma-she is doing better. She still has a slight cough and is still getting some shortness of breath with activity but overall feels much better. Does feel like the cough is gradually getting better. She finished the antibiotic and the prednisone. She's currently on Symbicort daily and using her albuterol as needed. She started her Zyrtec back and is also using Opcon-A eyedrops.  She also complains of the sensation in the right upper outer corner of her left I like there is something in there. It was there when she saw me a couple of weeks ago. She says it's been gradually getting better since using the allergy eyedrops but still there. She denies any change in vision. Her last eye exam was in September of last year. Next  Abscess in the groin-she says the antibiotic cleared up and she says it has completely resolved.   Review of Systems     Objective:   Physical Exam  Constitutional: She is oriented to person, place, and time. She appears well-developed and well-nourished.  HENT:  Head: Normocephalic and atraumatic.  Right Ear: External ear normal.  Left Ear: External ear normal.  Nose: Nose normal.  Mouth/Throat: Oropharynx is clear and moist.  TMs and canals are clear.   Eyes: Conjunctivae and EOM are normal. Pupils are equal, round, and reactive to light. Right eye exhibits no discharge. Left eye exhibits no discharge. No scleral icterus.  There is some moderate injection of the sclera on the left eye compared  to the right. Extra ocular movements are intact. I did not see any foreign bodies. I did not see any lesions on the back of the upper eyelid  Neck: Neck supple. No thyromegaly present.  Cardiovascular: Normal rate, regular rhythm and normal heart sounds.   Pulmonary/Chest: Effort normal and breath sounds normal. She has no wheezes.  Lymphadenopathy:    She has no cervical adenopathy.  Neurological: She is alert and oriented to person, place, and time.  Skin: Skin is warm and dry.  Psychiatric: She has a normal mood and affect.          Assessment & Plan:  Hypertension-well-controlled. Continue current regimen.  Asthma-moderate persistent-continue Symbicort. We discussed adding Singulair to help cover for spring allergies as well. Okay to continue the Zyrtec and okay to take the allergy eyedrops. Next  eye pain-the pain is not over the center portion of the eye. She's not had any vision changes it feels like it's up in the corner. I was unable to see anything on the back of the eyelid. There is definitely some injection to the sclera. Continue with eyedrops since it does seem to be helping and if not better in one week then recommend that she get back in with her eye doctor.  Groin abscess-completely resolved after course of Augmentin.

## 2015-08-07 ENCOUNTER — Ambulatory Visit: Payer: BLUE CROSS/BLUE SHIELD | Admitting: Family Medicine

## 2015-08-15 ENCOUNTER — Telehealth: Payer: Self-pay

## 2015-08-15 ENCOUNTER — Ambulatory Visit (INDEPENDENT_AMBULATORY_CARE_PROVIDER_SITE_OTHER): Payer: BLUE CROSS/BLUE SHIELD | Admitting: Family Medicine

## 2015-08-15 ENCOUNTER — Encounter: Payer: Self-pay | Admitting: Family Medicine

## 2015-08-15 VITALS — BP 122/84 | HR 84 | Wt 300.0 lb

## 2015-08-15 DIAGNOSIS — I10 Essential (primary) hypertension: Secondary | ICD-10-CM

## 2015-08-15 DIAGNOSIS — R05 Cough: Secondary | ICD-10-CM

## 2015-08-15 DIAGNOSIS — R059 Cough, unspecified: Secondary | ICD-10-CM

## 2015-08-15 DIAGNOSIS — J4530 Mild persistent asthma, uncomplicated: Secondary | ICD-10-CM

## 2015-08-15 DIAGNOSIS — R635 Abnormal weight gain: Secondary | ICD-10-CM | POA: Diagnosis not present

## 2015-08-15 LAB — BASIC METABOLIC PANEL WITH GFR
BUN: 10 mg/dL (ref 7–25)
CALCIUM: 8.7 mg/dL (ref 8.6–10.2)
CO2: 24 mmol/L (ref 20–31)
CREATININE: 0.54 mg/dL (ref 0.50–1.10)
Chloride: 104 mmol/L (ref 98–110)
GFR, Est African American: >89 mL/min (ref >=60)
GFR, Est Non African American: >89 mL/min (ref >=60)
GLUCOSE: 119 mg/dL — AB (ref 65–99)
Potassium: 4.2 mmol/L (ref 3.5–5.3)
SODIUM: 139 mmol/L (ref 135–146)

## 2015-08-15 MED ORDER — BENZONATATE 200 MG PO CAPS: 200.0000 mg | ORAL_CAPSULE | Freq: Two times a day (BID) | ORAL | Status: DC | PRN

## 2015-08-15 NOTE — Progress Notes (Signed)
   Subjective:    Patient ID: Stacey Castaneda, female    DOB: 07/03/1970, 45 y.o.   MRN: [id number]  HPI Hypertension- Pt denies chest pain, SOB, dizziness, or heart palpitations.  Taking meds as directed w/o problems.  Denies medication side effects.    Asthma - added singulair at last OV 5 weeks ago. She completed her prescription a couple of days ago. She hasn't noticed a big difference being off of it. She still has an intermittent cough but denies any shortness of breath or wheezing. She says in fact she is actually ready to drop down from her Symbicort to just inhaled corticosteroid. She is rarely using her albuterol rescue inhaler.   Abnormal weight gain-we decided to help phentermine while she was not doing well. Says she is not being able to exercise.    Review of Systems     Objective:   Physical Exam  Constitutional: She is oriented to person, place, and time. She appears well-developed and well-nourished.  HENT:  Head: Normocephalic and atraumatic.  Cardiovascular: Normal rate, regular rhythm and normal heart sounds.   Pulmonary/Chest: Effort normal and breath sounds normal.  Neurological: She is alert and oriented to person, place, and time.  Skin: Skin is warm and dry.  Psychiatric: She has a normal mood and affect. Her behavior is normal.       Assessment & Plan:  HTN - Well controlled. Continue current regimen. Follow up in 6 mo.    Asthma - mild intermittant. Stop Symbicort and change to Qvar. She still has some at home but is not sure about the dose.  Continue use albuterol as needed. She will start with 2 puffs twice a day  and then if she is doing well decrease down to 1 puff twice a day.  Chronic cough -  Can use tessalon perles PRN.  Encouraged her to try to suppress the cough reflexes sometimes this can cause recurring cough.  Abnormal weight gain - will restart Phentermine. Follow-up in one month for blood pressure and weight check. Unfortunately, her weight  is back up to 300 pounds.

## 2015-08-15 NOTE — Progress Notes (Signed)
Quick Note:  All labs are normal. ______ 

## 2015-09-07 ENCOUNTER — Encounter: Payer: Self-pay | Admitting: Family Medicine

## 2015-09-07 ENCOUNTER — Ambulatory Visit (INDEPENDENT_AMBULATORY_CARE_PROVIDER_SITE_OTHER): Payer: BLUE CROSS/BLUE SHIELD | Admitting: Family Medicine

## 2015-09-07 VITALS — BP 132/82 | HR 98 | Wt 286.0 lb

## 2015-09-07 DIAGNOSIS — Z6841 Body Mass Index (BMI) 40.0 and over, adult: Secondary | ICD-10-CM

## 2015-09-07 DIAGNOSIS — I10 Essential (primary) hypertension: Secondary | ICD-10-CM | POA: Diagnosis not present

## 2015-09-07 DIAGNOSIS — R635 Abnormal weight gain: Secondary | ICD-10-CM | POA: Diagnosis not present

## 2015-09-07 MED ORDER — AMLODIPINE BESYLATE 5 MG PO TABS: 5.0000 mg | ORAL_TABLET | Freq: Every day | ORAL | Status: DC

## 2015-09-07 MED FILL — AMLODIPINE BESYLATE 5 MG TA: 5 | 30 days supply | Qty: 30 | Fill #0

## 2015-09-07 NOTE — Progress Notes (Signed)
   Subjective:    Patient ID: Stacey Castaneda, female    DOB: [birth year], 45 y.o.   MRN: [id number]  HPI Follow-up abnormal weight gain/obesity/BMI greater than 40-we restarted her phentermine approximately 3 and half weeks ago. She is already  lost 14 pounds. She overall is doing fantastic. She decided just really work on portion control and food choices and has not actually been tracking her calories. She says some days she only takes a half of a tab of phentermine because it makes her feel little bit racy. She denies any chest pain or palpitations or insomnia on the medication. Next  Hypertension-she would really like to discuss possibly getting off of hydrochlorothiazide. Is been working great to control her blood pressure but she is worried about the sun sensitivity over the summer and in addition she says it makes her still urinate frequently which can some days be more problematic. She is also currently on losartan and doing well with that.   Review of Systems     Objective:   Physical Exam  Constitutional: She is oriented to person, place, and time. She appears well-developed and well-nourished.  HENT:  Head: Normocephalic and atraumatic.  Cardiovascular: Normal rate, regular rhythm and normal heart sounds.   Pulmonary/Chest: Effort normal and breath sounds normal.  Neurological: She is alert and oriented to person, place, and time.  Skin: Skin is warm and dry.  Psychiatric: She has a normal mood and affect. Her behavior is normal.          Assessment & Plan:  Abnormal weight gain/obesity/BMI greater than 40-she is Artie lost 14 pounds in less than 4 weeks. Overall she is doing fantastic. She has been walking more for exercise encouraged her to continue to do that as she is improving. Her asthma seems to have calmed down as well and I think that's been helping her increase her activity. Follow-up in one month. Next  Hypertension-well controlled today. Will discontinue  hydrochlorothiazide and try switching to amlodipine 5 mg daily. Did warn about possibility of ankle edema.

## 2015-10-06 ENCOUNTER — Ambulatory Visit (INDEPENDENT_AMBULATORY_CARE_PROVIDER_SITE_OTHER): Payer: BLUE CROSS/BLUE SHIELD | Admitting: Family Medicine

## 2015-10-06 ENCOUNTER — Telehealth: Payer: Self-pay | Admitting: Family Medicine

## 2015-10-06 ENCOUNTER — Encounter: Payer: Self-pay | Admitting: Family Medicine

## 2015-10-06 VITALS — BP 142/76 | HR 96 | Ht 64.0 in | Wt 297.0 lb

## 2015-10-06 DIAGNOSIS — G4733 Obstructive sleep apnea (adult) (pediatric): Secondary | ICD-10-CM | POA: Diagnosis not present

## 2015-10-06 DIAGNOSIS — R635 Abnormal weight gain: Secondary | ICD-10-CM | POA: Diagnosis not present

## 2015-10-06 DIAGNOSIS — I10 Essential (primary) hypertension: Secondary | ICD-10-CM | POA: Diagnosis not present

## 2015-10-06 DIAGNOSIS — Z6841 Body Mass Index (BMI) 40.0 and over, adult: Secondary | ICD-10-CM | POA: Diagnosis not present

## 2015-10-06 MED ORDER — AMBULATORY NON FORMULARY MEDICATION: Status: DC

## 2015-10-06 MED ORDER — PEN NEEDLES 31G X 8 MM MISC: Status: DC

## 2015-10-06 MED ORDER — AMLODIPINE BESYLATE 5 MG PO TABS: 5.0000 mg | ORAL_TABLET | Freq: Every day | ORAL | Status: DC

## 2015-10-06 MED ORDER — LIRAGLUTIDE -WEIGHT MANAGEMENT 18 MG/3ML ~~LOC~~ SOPN: 0.6000 mg | PEN_INJECTOR | Freq: Every day | SUBCUTANEOUS | Status: DC

## 2015-10-06 MED FILL — AMLODIPINE BESYLATE 5 MG TA: 5 | 90 days supply | Qty: 90 | Fill #0

## 2015-10-06 NOTE — Telephone Encounter (Signed)
Please call patient and let her know that there is no contraindication with the Sexenda and thyroid nodules. Only if there is a personal history of thyroid cancer.

## 2015-10-06 NOTE — Progress Notes (Addendum)
Subjective:    CC: Weight lOss HPI:  Hypertension- Pt denies chest pain, SOB, dizziness, or heart palpitations.  Taking meds as directed w/o problems.  Denies medication side effects.    Abnormal weight gain-she's been doing well on the phentermine and has actually started exercising more regularly. She does report that she started swelling yesterday after she did eat some [place] food. She felt like she was retaining fluid. She also feels very bloated today. She's currently taking half a tab of phentermine. No chest pain shortness of breath or palpitations on the medication. She's not sleeping as well. Typically getting anywhere between 5-7 hours on the phentermine.  She feels like she is eating better too.    Obstructive sleep apnea-it's been a while since we have checked her machine. We originally decided to start a lower plate pressure and gradually increase it because she was unable to tolerate the recommended pressure off of her sleep study.  Past medical history, Surgical history, Family history not pertinant except as noted below, Social history, Allergies, and medications have been entered into the medical record, reviewed, and corrections made.   Review of Systems: No fevers, chills, night sweats, weight loss, chest pain, or shortness of breath.   Objective:    General: Well Developed, well nourished, and in no acute distress.  Neuro: Alert and oriented x3, extra-ocular muscles intact, sensation grossly intact.  HEENT: Normocephalic, atraumatic  Skin: Warm and dry, no rashes. Cardiac: Regular rate and rhythm, no murmurs rubs or gallops, no lower extremity edema.  Respiratory: Clear to auscultation bilaterally. Not using accessory muscles, speaking in full sentences.   Impression and Recommendations:   HTN - Well controlled. Continue current regimen. Follow up in 3-4 months.   Abnormal weight gain/BMI 50 - We discussed several options. Since she did gain weight we discussed  possibly giving the phentermine 1 more month versus trying one of the newer medications. We discussed Hilliard Clark is a possibility. Discussed pros and cons and potential side effects of the medication including nausea. She does have a prior history of thyroid nodules. Will send new perception to the pharmacy. Even coupon card as well. Otherwise follow-up in 4-6 weeks. Continue with exercise as she has started exercising more recently. It really watch salt intake since she tends to swell very easily on this area certainly water retention can cause a bump in her weight as well. Continue with phentermine for now until we can see if the  new medications is covered.    OSA - will call [facility] and see if we can increase her CPAP to 8 cm of water pressure. Believe she is currently set on 6 minute centimeters with a delayed ramp time. Then we can have them send [place] a download after one week to make sure that it is working well.

## 2015-10-06 NOTE — Addendum Note (Signed)
Addended by: Beatrice Lecher D on: 10/06/2015 05:29 PM   Modules accepted: Orders, Level of Service, SmartSet

## 2015-10-09 NOTE — Telephone Encounter (Signed)
Pt informed.Stacey Castaneda Stacey Castaneda  

## 2015-10-13 ENCOUNTER — Telehealth: Payer: Self-pay | Admitting: *Deleted

## 2015-10-13 NOTE — Telephone Encounter (Signed)
Please call patient and the Hilliard Clark was denied through her pharmacy benefit plan. She may want to consider calling them to see if any of the other newer agents might be covered as an alternative to the phentermine. For now we can continue with the phentermine. Also consider referral to nutritionist if she feels that would be helpful.

## 2015-10-13 NOTE — Telephone Encounter (Signed)
Patient notified and voiced understanding. She will f/u with insurance and get back with [place] to let us know what she decides

## 2015-10-13 NOTE — Telephone Encounter (Signed)
Submitted PA through covermymeds. [id number] - Rx #: [id number]   Information regarding your request  Drug is not covered by plan.

## 2015-11-06 ENCOUNTER — Encounter: Payer: Self-pay | Admitting: Family Medicine

## 2015-11-10 ENCOUNTER — Ambulatory Visit (INDEPENDENT_AMBULATORY_CARE_PROVIDER_SITE_OTHER): Payer: BLUE CROSS/BLUE SHIELD | Admitting: Family Medicine

## 2015-11-10 ENCOUNTER — Telehealth: Payer: Self-pay | Admitting: Family Medicine

## 2015-11-10 ENCOUNTER — Encounter: Payer: Self-pay | Admitting: Family Medicine

## 2015-11-10 VITALS — BP 140/82 | HR 87 | Wt 295.0 lb

## 2015-11-10 DIAGNOSIS — R635 Abnormal weight gain: Secondary | ICD-10-CM | POA: Diagnosis not present

## 2015-11-10 DIAGNOSIS — Z6841 Body Mass Index (BMI) 40.0 and over, adult: Secondary | ICD-10-CM

## 2015-11-10 MED ORDER — TOPIRAMATE 25 MG PO CPSP: 25.0000 mg | ORAL_CAPSULE | Freq: Every day | ORAL | Status: DC

## 2015-11-10 MED ORDER — PHENTERMINE HCL 37.5 MG PO CAPS: 37.5000 mg | ORAL_CAPSULE | ORAL | Status: DC

## 2015-11-10 MED FILL — TOPIRAMATE 25 MG TABLET: 25 | 30 days supply | Qty: 60 | Fill #0

## 2015-11-10 NOTE — Telephone Encounter (Signed)
Please call patient: I meant to ask her if they have adjusted her CPAP  To 8 c m of water pressure. If they have then let's call the home health company and get them to do a new download

## 2015-11-10 NOTE — Telephone Encounter (Signed)
lvm asking pt to rtn call about cpap settings. Ok to lvm .Stacey Castaneda [place]

## 2015-11-10 NOTE — Progress Notes (Signed)
Subjective:    CC: Weight check   HPI:  Abnormal weight gain - she wasn't able to get the saxenda. She has been off the phentermine for a couple of months. She has actually lost 2 lbs.  She did well with phentermine previously but did plateaued. She's really interested in trying something different. Unfortunately her insurance will not cover any of the newer weight loss medications. She was walking regularly and doing really well and she injured her foot. She has struggled some since then but has still actually lost 2 pounds since I last saw her. She's continuing to work on diet as well.  Past medical history, Surgical history, Family history not pertinant except as noted below, Social history, Allergies, and medications have been entered into the medical record, reviewed, and corrections made.   Review of Systems: No fevers, chills, night sweats, weight loss, chest pain, or shortness of breath.   Objective:    General: Well Developed, well nourished, and in no acute distress.  Neuro: Alert and oriented x3, extra-ocular muscles intact, sensation grossly intact.  HEENT: Normocephalic, atraumatic  Skin: Warm and dry, no rashes. Cardiac: Regular rate and rhythm, no murmurs rubs or gallops, no lower extremity edema.  Respiratory: Clear to auscultation bilaterally. Not using accessory muscles, speaking in full sentences.   Impression and Recommendations:   Abnormal weight gain - Discussed different options including topiramate, Wellbutrin, metformin. She is not interested in metformin because it can cause loose stools and she artery has a lot of diarrhea with her previous surgeries. Will try latest are made. Warned about potential side effects. If this one is not covered by her insurance and we will consider Wellbutrin. She does have a prior history of depression and thinks she may have taken it several years ago for that. She doesn't remember any negative side effects per se. I did go ahead and  give her perception for phentermine to use just as a backup in case the topiramate is not covered. She plans on getting back on track with diet and exercise.   Time spent 74min, > 50% spent counseling about weight loss.

## 2015-11-13 ENCOUNTER — Encounter: Payer: Self-pay | Admitting: Family Medicine

## 2015-11-13 NOTE — Telephone Encounter (Signed)
Pt sent mychart message.Stacey Castaneda [place]

## 2015-11-20 ENCOUNTER — Telehealth: Payer: Self-pay | Admitting: Family Medicine

## 2015-11-20 NOTE — Telephone Encounter (Signed)
Call patient regarding message below that was not checked on my chart.  This message is to inform you that the patient has not yet read the following message. (Notification date: November 20, 2015)    RE: Visit Follow-Up Question    From  Hali Marry, MD   To  Shivonne Trudel   Sent  11/06/2015 1:32 PM     The only other one on the market are Qsymia. It sound like none of them are going to be covered. What do you think you want to do next?         Previous Messages     ----- Message -----   From: Kandice Hams   Sent: 11/06/2015 10:36 AM EDT    To: Beatrice Lecher, MD  Subject: Visit Follow-Up Question   Re: followup with insurance on medications that are covered for weight loss. The following are NOT covered. I could not get a list from them of any medicines that would be covered. They require me to provide a list of names.  I have downloaded an app to use in office to try for names per the 5th person's recommendation.  I'd be happy to try for any other names if you can supply them.  Xenical 120 mg capsule  saxenda 3mg /0.5 mi (18 mg/46mi)  belviq 10 mg tablet & Xr 20 Mg talet, extended release  contrave 8 mg-90 mg tablet, extended release      Audit Agricultural engineer Last Read On  Kandice Hams Not Read

## 2015-11-24 NOTE — Telephone Encounter (Signed)
Called pt and she stated that she is taking the Topamax and this is covered.Stacey Castaneda

## 2015-12-07 MED FILL — MONTELUKAST SOD 10 MG TAB: 10 | 30 days supply | Qty: 30 | Fill #1

## 2015-12-26 MED FILL — TOPIRAMATE 25 MG TABLET: 25 | 30 days supply | Qty: 60 | Fill #1

## 2015-12-27 ENCOUNTER — Other Ambulatory Visit: Payer: Self-pay | Admitting: *Deleted

## 2015-12-27 MED ORDER — MONTELUKAST SODIUM 10 MG PO TABS: 10.0000 mg | ORAL_TABLET | Freq: Every day | ORAL | 4 refills | Status: DC

## 2015-12-27 MED ORDER — HYDROCHLOROTHIAZIDE 25 MG PO TABS: 25.0000 mg | ORAL_TABLET | Freq: Every day | ORAL | 4 refills | Status: DC

## 2015-12-27 MED FILL — HYDROCHLOROTHIAZIDE 25 MG T: 25 | 90 days supply | Qty: 90 | Fill #0

## 2016-01-09 MED FILL — AMLODIPINE BESYLATE 5 MG TA: 5 | 90 days supply | Qty: 90 | Fill #1

## 2016-01-12 ENCOUNTER — Encounter: Payer: Self-pay | Admitting: Family Medicine

## 2016-01-12 ENCOUNTER — Ambulatory Visit (INDEPENDENT_AMBULATORY_CARE_PROVIDER_SITE_OTHER): Payer: BLUE CROSS/BLUE SHIELD | Admitting: Family Medicine

## 2016-01-12 VITALS — BP 130/78 | HR 79 | Ht 64.0 in | Wt 293.0 lb

## 2016-01-12 DIAGNOSIS — J4540 Moderate persistent asthma, uncomplicated: Secondary | ICD-10-CM

## 2016-01-12 DIAGNOSIS — Z6841 Body Mass Index (BMI) 40.0 and over, adult: Secondary | ICD-10-CM

## 2016-01-12 MED ORDER — TOPIRAMATE 50 MG PO TABS: 50.0000 mg | ORAL_TABLET | Freq: Two times a day (BID) | ORAL | 1 refills | Status: DC

## 2016-01-12 MED FILL — TOPIRAMATE 50 MG TABLET: 50 | 30 days supply | Qty: 60 | Fill #0

## 2016-01-12 NOTE — Patient Instructions (Signed)
Restart Symbicort - 1 puff twice a day.   Continue the Ipratropium nasal spray.

## 2016-01-12 NOTE — Progress Notes (Signed)
   Subjective:    Patient ID: Stacey Castaneda, female    DOB: [birth year], 45 y.o.   MRN: [id number]  HPI Abnormal weight gain - Has lost 2 lbs.  She has done well on topamax.  Had some nausea when first started it.  She says she then went up to 2 tabs and felt nauseated so went back down to 1 and stayed with that for a while. She then went back up to 2 tabs and has been on that for about a month. She does feel like it's curbed her appetite and she has been satisfied with smaller portion sizes. Check she feels like it's given her a little bit more energy as well.  Asthma-she had an upper respiratory infection about a month ago and has had a persistent cough since then. It's mostly dry and spasmodic at times. No fevers chills or sweats. Overall she feels fine. She just kept hoping it would get better on its own but really hasn't. She's noticed that she's been using her albuterol more frequently. She says it gives her temporary relief.    Review of Systems     Objective:   Physical Exam  Constitutional: She is oriented to person, place, and time. She appears well-developed and well-nourished.  HENT:  Head: Normocephalic and atraumatic.  Right Ear: External ear normal.  Left Ear: External ear normal.  Nose: Nose normal.  Mouth/Throat: Oropharynx is clear and moist.  TMs and canals are clear.   Eyes: Conjunctivae and EOM are normal. Pupils are equal, round, and reactive to light.  Neck: Neck supple. No thyromegaly present.  Cardiovascular: Normal rate, regular rhythm and normal heart sounds.   Pulmonary/Chest: Effort normal and breath sounds normal. She has no wheezes.  Coarse BS Bilat.   Lymphadenopathy:    She has no cervical adenopathy.  Neurological: She is alert and oriented to person, place, and time.  Skin: Skin is warm and dry.  Psychiatric: She has a normal mood and affect.        Assessment & Plan:  Abnormal weight gain/morbid obesity/BMI 50 - Discussed options. Will try  pushing the Topamax to 50 mg twice a day since she's been tolerating it well so far. Continue to encourage healthy diet and regular exercise and recheck weight in 2 months.  Asthma, moder persistant-symptoms recently exacerbated by upper respiratory infection. Currently on Qvar. Will increase the combination medication. She still has some low-dose Symbicort at home and so we'll go ahead and restart that. We'll start with 1 puff twice a day. Can increase if needed over the next few weeks. Follow-up in 2 months.

## 2016-01-23 ENCOUNTER — Other Ambulatory Visit: Payer: Self-pay | Admitting: Family Medicine

## 2016-01-23 MED ORDER — ALPRAZOLAM 0.25 MG PO TABS: 0.2500 mg | ORAL_TABLET | Freq: Every day | ORAL | 0 refills | Status: DC | PRN

## 2016-01-23 MED FILL — ALPRAZolam 0.25 MG TABS: 0.25 | 10 days supply | Qty: 10 | Fill #0

## 2016-01-23 NOTE — Telephone Encounter (Signed)
Pt stated that she is under some stress right now. She had an old bottle of xanax that had about 8 tablets left in it. I advised her not to take those. [facility].[name] [place]

## 2016-02-02 ENCOUNTER — Other Ambulatory Visit: Payer: Self-pay | Admitting: Family Medicine

## 2016-02-02 MED ORDER — BUDESONIDE-FORMOTEROL FUMARATE 80-4.5 MCG/ACT IN AERO: 1.0000 | INHALATION_SPRAY | Freq: Two times a day (BID) | RESPIRATORY_TRACT | 1 refills | Status: DC

## 2016-02-02 MED FILL — SYMBICORT 80-4.5 MCG INH: 80-4.5 | 30 days supply | Qty: 10 | Fill #0

## 2016-02-09 ENCOUNTER — Encounter: Payer: Self-pay | Admitting: Family Medicine

## 2016-03-14 ENCOUNTER — Ambulatory Visit (INDEPENDENT_AMBULATORY_CARE_PROVIDER_SITE_OTHER): Payer: BLUE CROSS/BLUE SHIELD | Admitting: Family Medicine

## 2016-03-14 ENCOUNTER — Encounter: Payer: Self-pay | Admitting: Family Medicine

## 2016-03-14 VITALS — BP 131/82 | HR 79 | Wt 293.4 lb

## 2016-03-14 DIAGNOSIS — R41840 Attention and concentration deficit: Secondary | ICD-10-CM | POA: Diagnosis not present

## 2016-03-14 DIAGNOSIS — R413 Other amnesia: Secondary | ICD-10-CM | POA: Diagnosis not present

## 2016-03-14 DIAGNOSIS — F329 Major depressive disorder, single episode, unspecified: Secondary | ICD-10-CM

## 2016-03-14 DIAGNOSIS — Z6841 Body Mass Index (BMI) 40.0 and over, adult: Secondary | ICD-10-CM

## 2016-03-14 DIAGNOSIS — I10 Essential (primary) hypertension: Secondary | ICD-10-CM

## 2016-03-14 DIAGNOSIS — F32A Depression, unspecified: Secondary | ICD-10-CM

## 2016-03-14 NOTE — Progress Notes (Signed)
Subjective:    Patient ID: Stacey Castaneda, female    DOB: 1970/06/21, 45 y.o.   MRN: [id number]  HPI Obesity-we decided to increase her Topamax to 50 mg twice a day. We switch from the 25 mg capsules, taking 2 up to the 50 mg capsule. She felt extremely nauseated. She even stopped it for a while and then restarted it and the same thing happened. She's not sure she really wants to restart it at this time but wants to know if the in the future if we retry it if we can just up with a 25 mg and have her take 2 and gradually work up to 2 tabs twice a day. She did decide to join [employer] and did a smart points plan.  She's also been very worried about her memory. She's been noticing some decline particularly with her short-term memory over the last year or more. She said she's also noted she's having a hard time focusing. She is wearing her CPAP consistently. She showed me on her smart phone app where she is wearing it very regularly. Over the last 2 weeks her AHI was under 4 except for one day. She does feel like her new mask is fitting well and the new pressure is working really well. She has a prior history of depression. She says she doesn't feel like she's quite at that point but has noticed some symptoms.  Husband stressing her out to the point where she actually took an alprazolam. She typically keeps him just for flying but actually took one not that long ago when her sister got her really upset.  Hypertension- Pt denies chest pain, SOB, dizziness, or heart palpitations.  Taking meds as directed w/o problems.  Denies medication side effects.      Review of Systems     Objective:   Physical Exam  Constitutional: She is oriented to person, place, and time. She appears well-developed and well-nourished.  HENT:  Head: Normocephalic and atraumatic.  Cardiovascular: Normal rate, regular rhythm and normal heart sounds.   Pulmonary/Chest: Effort normal and breath sounds normal.   Neurological: She is alert and oriented to person, place, and time.  Skin: Skin is warm and dry.  Psychiatric: She has a normal mood and affect. Her behavior is normal.       Assessment & Plan:  Obesity-I think joining Weight [employer] is fantastic. There is really good study showing that it is effective and it does work well. Encouraged her to keep it. We can always consider adding that appear made down the road if needed.  Premature memory loss-unclear etiology. I do think some of this could be related to increased stress levels. She is treating her CPAP effectively so it should not be from that. She's also had some difficulty with concentration so could be related to mood. I did have her complete her PHQ 9 today. We discussed the possibility of medication and/or referral to for therapy or counseling. We discussed using alprazolam very sparingly and that it can be very habit forming. Warned about the potentials of that.  HTN - Repeat blood pressure at goal but we will differently keep an eye on it. Next  Depression-I did have her complete a PHQ 9 score today. Score was 13 which is consistent with moderate depression. She rates her symptoms is somewhat difficult. We did discuss options. She had been on several different medications in the past when she had an episode of depression years ago. Will call  to get those records from her psychiatrist that she was sitting at that time and that we can get her started on some Phenergan and see her back in about 4 weeks. She wants something that has not been a cause weight gain. She's not as concerned with sexual side effects at this point in time.

## 2016-03-18 ENCOUNTER — Telehealth: Payer: Self-pay | Admitting: Family Medicine

## 2016-03-18 NOTE — Telephone Encounter (Signed)
Call pt: we called from records from Dr. [facility]. B/c had been 10 years. Her records were destroyed so not sure what she wants to do.

## 2016-03-20 NOTE — Telephone Encounter (Signed)
Left vm for to return call to clinic

## 2016-03-22 ENCOUNTER — Encounter: Payer: Self-pay | Admitting: *Deleted

## 2016-03-22 ENCOUNTER — Emergency Department (INDEPENDENT_AMBULATORY_CARE_PROVIDER_SITE_OTHER)
Admission: EM | Admit: 2016-03-22 | Discharge: 2016-03-22 | Disposition: A | Discharge status: Home / Self Care | Payer: BLUE CROSS/BLUE SHIELD | Source: Home / Self Care | Attending: Family Medicine | Admitting: Family Medicine

## 2016-03-22 DIAGNOSIS — M62838 Other muscle spasm: Secondary | ICD-10-CM | POA: Diagnosis not present

## 2016-03-22 MED ORDER — METHYLPREDNISOLONE SODIUM SUCC 40 MG IJ SOLR
40.0000 mg | Freq: Once | INTRAMUSCULAR | Status: AC
  Administered 2016-03-22: 40 mg via INTRAMUSCULAR

## 2016-03-22 MED ORDER — ESCITALOPRAM OXALATE 10 MG PO TABS: 10.0000 mg | ORAL_TABLET | Freq: Every day | ORAL | 0 refills | Status: DC

## 2016-03-22 MED ORDER — METHOCARBAMOL 500 MG PO TABS: 500.0000 mg | ORAL_TABLET | Freq: Two times a day (BID) | ORAL | 0 refills | Status: DC

## 2016-03-22 MED ORDER — IBUPROFEN 600 MG PO TABS: 600.0000 mg | ORAL_TABLET | Freq: Four times a day (QID) | ORAL | 0 refills | Status: DC | PRN

## 2016-03-22 MED ORDER — HYDROCODONE-ACETAMINOPHEN 5-325 MG PO TABS: 1.0000 | ORAL_TABLET | Freq: Four times a day (QID) | ORAL | 0 refills | Status: DC | PRN

## 2016-03-22 MED ORDER — KETOROLAC TROMETHAMINE 60 MG/2ML IM SOLN
60.0000 mg | Freq: Once | INTRAMUSCULAR | Status: AC
  Administered 2016-03-22: 60 mg via INTRAMUSCULAR

## 2016-03-22 MED FILL — ESCITALOPRAM 10 MG TABLET: 10 | 90 days supply | Qty: 90 | Fill #0

## 2016-03-22 NOTE — ED Triage Notes (Signed)
Pt reports sudden onset right sided neck pain that started today while standing and turning her head. Pain radiates up into her head and behind right eye with certain movements.

## 2016-03-22 NOTE — Telephone Encounter (Signed)
Pt came in office today. She reports that in the past she has tried Lexapro and Celexa.  She said those 2 meds had the least side effects. She is generally ok with what you think is best for her. Please advise.

## 2016-03-22 NOTE — ED Provider Notes (Signed)
CSN: [id number]     Arrival date & time 03/22/16  1536 History   First MD Initiated Contact with Patient 03/22/16 1705     Chief Complaint  Patient presents with  . Neck Pain   (Consider location/radiation/quality/duration/timing/severity/associated sxs/prior Treatment) HPI  Stacey Castaneda is a 45 y.o. female presenting to UC with c/o severe sudden onset Right sided neck pain and stiffness after hearing stressful news at work and turning her head to the right. Pain is aching and sharp. Worse with standing and head movements. She did go have a massage earlier today with her husband, which provided, mild temporary relief, however, pain and neck stiffness seemed to have worsened throughout the afternoon.  Pain is 4/10 at this time. Hx of back pain but back pain is not worse than normal. Denies radiation of pain or numbness in arms or legs. No fever or rashes. Denies known injury.  Denies hx of neck or back surgeries.    Past Medical History:  Diagnosis Date  . Anxiety   . Depression   . Hypertension   . Perianal fistula   . Perirectal abscess 10/2012  . PONV (postoperative nausea and vomiting)   . Poor venous access    01-26-14 "states having PICC line placed on 01-27-14"  . Seasonal allergic rhinitis   . Thyroid goiter    BENIGN- remains "tested negative"  . Vitreous floaters of left eye    evaluated-not always an issue  . Wears contact lenses    Past Surgical History:  Procedure Laterality Date  . ANAL FISTULECTOMY N/A 01/28/2014   Procedure: LIGATION OF INTERNAL FISTULA TRACT, REMOVAL OF SETON ;  Surgeon: Leighton Ruff, MD;  Location: [facility];  Service: General;  Laterality: N/A;  . DILATATION OF CERVIX/  REMOVAL AND REPLACEMENT MIRENA  10-22-2010  . DILATION AND CURETTAGE OF UTERUS  1990's  . EVALUATION UNDER ANESTHESIA WITH ANAL FISTULECTOMY N/A 09/03/2013   Procedure: EXAM UNDER ANESTHESIA ;  Surgeon: Leighton Ruff, MD;  Location: [facility];  Service: General;  Laterality: N/A;  .  HYSTEROSCOPY W/D&C  08-22-2005  . INCISION AND DRAINAGE PERIRECTAL ABSCESS N/A 10/30/2012   Procedure: peri rectal INCISION AND DRAINAGE ABSCESS;  Surgeon: Earnstine Regal, MD;  Location: [facility];  Service: General;  Laterality: N/A;  . PLACEMENT OF SETON N/A 09/03/2013   Procedure: PLACEMENT OF SETON;  Surgeon: Leighton Ruff, MD;  Location: [facility];  Service: General;  Laterality: N/A;  . WISDOM TOOTH EXTRACTION  AGE 71   Family History  Problem Relation Age of Onset  . Diabetes Father   . Heart disease Father   . Hypertension Father   . Diabetes Mother   . Hypertension Mother   . Lung cancer Mother   . Breast cancer Maternal Grandmother    Social History  Substance Use Topics  . Smoking status: Former Smoker    Packs/day: 1.50    Years: 10.00    Types: Cigarettes    Quit date: 10/31/2006  . Smokeless tobacco: Never Used  . Alcohol use Yes     Comment: RARE   OB History    Gravida Para Term Preterm AB Living   0             SAB TAB Ectopic Multiple Live Births                 Review of Systems  Musculoskeletal: Positive for back pain, myalgias, neck pain and neck stiffness.  Skin: Negative for color change, rash  and wound.  Neurological: Negative for weakness and numbness.    Allergies  Lisinopril  Home Medications   Prior to Admission medications   Medication Sig Start Date End Date Taking? Authorizing Provider  albuterol (PROVENTIL HFA;VENTOLIN HFA) 108 (90 BASE) MCG/ACT inhaler Inhale 2 puffs into the lungs every 4 (four) hours as needed for wheezing or shortness of breath. 03/28/15   Hali Marry, MD  ALPRAZolam Duanne Moron) 0.25 MG tablet Take 1 tablet (0.25 mg total) by mouth daily as needed for anxiety. And flying 01/23/16 01/22/17  Hali Marry, MD  AMBULATORY NON FORMULARY MEDICATION Medication Name: Increase CPAP setting to 8 cm water perssure. Dx OSA. She is with [facility].  Have them fax [place] dowload in one week after pressure adjustement. 10/06/15    Hali Marry, MD  amLODipine (NORVASC) 5 MG tablet Take 1 tablet (5 mg total) by mouth daily. 10/06/15   Hali Marry, MD  budesonide-formoterol (SYMBICORT) 80-4.5 MCG/ACT inhaler Inhale 1-2 puffs into the lungs 2 (two) times daily. 02/02/16   Hali Marry, MD  cetirizine (ZYRTEC) 10 MG tablet Take 10 mg by mouth every morning.     Historical Provider, MD  docusate sodium (COLACE) 100 MG capsule Take 100 mg by mouth as needed.     Historical Provider, MD  escitalopram (LEXAPRO) 10 MG tablet Take 1 tablet (10 mg total) by mouth daily. 03/22/16   Hali Marry, MD  fluticasone (FLONASE) 50 MCG/ACT nasal spray Place 2 sprays into both nostrils every morning. Use two sprays in each nostril daily 07/07/13   Hali Marry, MD  hydrochlorothiazide (HYDRODIURIL) 25 MG tablet Take 1 tablet (25 mg total) by mouth daily. 12/27/15   Hali Marry, MD  HYDROcodone-acetaminophen (NORCO/VICODIN) 5-325 MG tablet Take 1-2 tablets by mouth every 6 (six) hours as needed for moderate pain or severe pain. 03/22/16   Noland Fordyce, PA-C  ibuprofen (ADVIL,MOTRIN) 600 MG tablet Take 1 tablet (600 mg total) by mouth every 6 (six) hours as needed. 03/22/16   Noland Fordyce, PA-C  ipratropium (ATROVENT) 0.03 % nasal spray Place 1-2 sprays into both nostrils every 12 (twelve) hours.    Historical Provider, MD  methocarbamol (ROBAXIN) 500 MG tablet Take 1 tablet (500 mg total) by mouth 2 (two) times daily. 03/22/16   Noland Fordyce, PA-C  montelukast (SINGULAIR) 10 MG tablet Take 1 tablet (10 mg total) by mouth at bedtime. 12/27/15   Hali Marry, MD  Multiple Vitamins-Minerals (MULTIVITAMIN GUMMIES ADULT PO) Take 2 each by mouth every morning.    Historical Provider, MD  OVER THE COUNTER MEDICATION Place 1-2 drops into both eyes once as needed (dry eyes/ contacts.).    Historical Provider, MD   Meds Ordered and Administered this Visit   Medications  methylPREDNISolone sodium  succinate (SOLU-MEDROL) 40 mg/mL injection 40 mg (40 mg Intramuscular Given 03/22/16 1731)  ketorolac (TORADOL) injection 60 mg (60 mg Intramuscular Given 03/22/16 1731)    BP 145/85 (BP Location: Left Arm)   Pulse 80   Resp 16   Wt 290 lb (131.5 kg)   BMI 49.78 kg/m  No data found.   Physical Exam  Constitutional: She is oriented to person, place, and time. She appears well-developed and well-nourished.  Pt sitting on exam bed, trying to get into comfortable position. Appears to be in mild discomfort.  HENT:  Head: Normocephalic and atraumatic.  Eyes: EOM are normal.  Neck: Neck supple. Muscular tenderness present. No spinous process tenderness present.  No midline spinal tenderness. Tenderness to Left and Right cervical muscles, worse on Right side. Limited head movements due to pain.   Cardiovascular: Normal rate.   Pulmonary/Chest: Effort normal. No respiratory distress.  Musculoskeletal: Normal range of motion. She exhibits tenderness. She exhibits no edema.  No midline spinal tenderness. Full ROM upper and lower extremies bilaterally.  Tenderness to Right upper trapezius muscle. Palpable muscle spasms.    Neurological: She is alert and oriented to person, place, and time.  Skin: Skin is warm and dry. Capillary refill takes less than 2 seconds. No rash noted. She is not diaphoretic. No erythema.  Psychiatric: She has a normal mood and affect. Her behavior is normal.  Nursing note and vitals reviewed.   Urgent Care Course   Clinical Course     Procedures (including critical care time)  Labs Review Labs Reviewed - No data to display  Imaging Review No results found.    MDM   1. Muscle spasms of neck    Pt c/o Right side neck pain and stiffness. Increased stress at work. No known injury. No fever or rash. Palpable muscle spasms in Right upper trapezius. No bony tenderness. Doubt meningitis.  No indication for imaging at this time.  Toradol 60mg  IM and Solumedrol  40mg  IM given in UC  Rx: Robaxin, Norco, and Ibuprofen (pt requested to limit steroid use if possible)  Home care instructions with neck stretching exercises provided. Encouraged f/u with PCP next week if not improving.  Patient verbalized understanding and agreement with treatment plan.    Noland Fordyce, PA-C 03/22/16 [phone]

## 2016-03-22 NOTE — Discharge Instructions (Signed)
°  Robaxin is a muscle relaxer and may cause drowsiness. Do not drink alcohol, drive, or operate heavy machinery while taking.  Norco/Vicodin (hydrocodone-acetaminophen) is a narcotic pain medication, do not combine these medications with others containing tylenol. While taking, do not drink alcohol, drive, or perform any other activities that requires focus while taking these medications.

## 2016-03-22 NOTE — Telephone Encounter (Signed)
scipt sent for Lexapro.

## 2016-03-25 NOTE — Telephone Encounter (Signed)
Pt notified of new Rx.  °

## 2016-03-27 MED FILL — AMLODIPINE BESYLATE 5 MG TA: 5 | 30 days supply | Qty: 30 | Fill #1

## 2016-04-11 ENCOUNTER — Encounter: Payer: Self-pay | Admitting: Family Medicine

## 2016-04-11 ENCOUNTER — Ambulatory Visit (INDEPENDENT_AMBULATORY_CARE_PROVIDER_SITE_OTHER): Payer: BLUE CROSS/BLUE SHIELD | Admitting: Family Medicine

## 2016-04-11 VITALS — BP 128/76 | HR 76 | Ht 64.0 in | Wt 276.0 lb

## 2016-04-11 DIAGNOSIS — I10 Essential (primary) hypertension: Secondary | ICD-10-CM

## 2016-04-11 DIAGNOSIS — J4540 Moderate persistent asthma, uncomplicated: Secondary | ICD-10-CM

## 2016-04-11 DIAGNOSIS — S46819A Strain of other muscles, fascia and tendons at shoulder and upper arm level, unspecified arm, initial encounter: Secondary | ICD-10-CM

## 2016-04-11 DIAGNOSIS — S46811A Strain of other muscles, fascia and tendons at shoulder and upper arm level, right arm, initial encounter: Secondary | ICD-10-CM | POA: Diagnosis not present

## 2016-04-11 DIAGNOSIS — R635 Abnormal weight gain: Secondary | ICD-10-CM

## 2016-04-11 MED ORDER — IBUPROFEN 600 MG PO TABS: 600.0000 mg | ORAL_TABLET | Freq: Four times a day (QID) | ORAL | 0 refills | Status: DC | PRN

## 2016-04-11 MED ORDER — MONTELUKAST SODIUM 10 MG PO TABS: 10.0000 mg | ORAL_TABLET | Freq: Every day | ORAL | 4 refills | Status: DC

## 2016-04-11 MED ORDER — AMLODIPINE BESYLATE 5 MG PO TABS: 5.0000 mg | ORAL_TABLET | Freq: Every day | ORAL | 1 refills | Status: DC

## 2016-04-11 MED ORDER — BECLOMETHASONE DIPROPIONATE 40 MCG/ACT IN AERS: 2.0000 | INHALATION_SPRAY | Freq: Two times a day (BID) | RESPIRATORY_TRACT | 0 refills | Status: DC

## 2016-04-11 MED ORDER — ESCITALOPRAM OXALATE 10 MG PO TABS: 10.0000 mg | ORAL_TABLET | Freq: Every day | ORAL | 1 refills | Status: DC

## 2016-04-11 MED ORDER — METHOCARBAMOL 500 MG PO TABS: 500.0000 mg | ORAL_TABLET | Freq: Two times a day (BID) | ORAL | 0 refills | Status: DC | PRN

## 2016-04-11 NOTE — Progress Notes (Signed)
Subjective:    CC: HTN  HPI:  Hypertension- Pt denies chest pain, SOB, dizziness, or heart palpitations.  Taking meds as directed w/o problems.  Denies medication side effects.    Obesity/BMI 47-she's lost 14 pounds on Weight [employer] since I last saw her. She says it's really helped her with portion control and she is doing fantastic. Last week she was able to walk 7 days. This week she is only been able to walk to that because she's been having some pain in her feet.  She has had some upper back shoulder and neck problems over the last 3 weeks. It has flared about 3 times. Most of her pain is actually between the neck and the shoulder over the trapezius muscle. She's been mostly using anti--muscle relaxers which do seem to help. No known injury. She says the last time she had back back problems was when she actually fell and herniated disc but that was years ago.  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 When she finishes her current inhaler of Symbicort she would like to try going back down to just Qvar.  Past medical history, Surgical history, Family history not pertinant except as noted below, Social history, Allergies, and medications have been entered into the medical record, reviewed, and corrections made.   Review of Systems: No fevers, chills, night sweats, weight loss, chest pain, or shortness of breath.   Objective:    General: Well Developed, well nourished, and in no acute distress.  Neuro: Alert and oriented x3, extra-ocular muscles intact, sensation grossly intact.  HEENT: Normocephalic, atraumatic  Skin: Warm and dry, no rashes. Cardiac: Regular rate and rhythm, no murmurs rubs or gallops, no lower extremity edema.  Respiratory: Clear to auscultation bilaterally. Not using accessory muscles, speaking in full sentences. Neck:  NROM.     Impression and Recommendations:   Hypertension- Well controlled. Continue current regimen. Follow up in  6 months.  Since to mail order  for refill.  Trapezius strain-given handout on stretches and exercises to do on her own at home. Let me know if not improving.    Fill prescription for ibuprofen. Also refilled her muscle relaxer. She said she really doesn't need it yet she still has some Lortabs but she like to get a refill before the new year as she will be changing insurance plans.  Obesity/BMI 47-doing great!  Down 14 lbs.  F/U in 8 weeks.    Reactive airway disease-she is currently on Symbicort but hoping by the end of the month she can switch back to Qvar. Says she like to go ahead and have a perception sent to mail order.

## 2016-04-12 MED FILL — METHOCARBAMOL 500 MG TABLET: 500 | 15 days supply | Qty: 30 | Fill #0

## 2016-05-01 ENCOUNTER — Other Ambulatory Visit: Payer: Self-pay | Admitting: Obstetrics & Gynecology

## 2016-05-01 DIAGNOSIS — Z1239 Encounter for other screening for malignant neoplasm of breast: Secondary | ICD-10-CM

## 2016-05-03 MED FILL — SYMBICORT 80-4.5 MCG INH: 80-4.5 | 30 days supply | Qty: 10 | Fill #1

## 2016-05-07 ENCOUNTER — Telehealth: Payer: Self-pay | Admitting: Family Medicine

## 2016-05-07 ENCOUNTER — Ambulatory Visit (INDEPENDENT_AMBULATORY_CARE_PROVIDER_SITE_OTHER): Payer: BLUE CROSS/BLUE SHIELD

## 2016-05-07 DIAGNOSIS — Z13220 Encounter for screening for lipoid disorders: Secondary | ICD-10-CM

## 2016-05-07 DIAGNOSIS — I10 Essential (primary) hypertension: Secondary | ICD-10-CM

## 2016-05-07 DIAGNOSIS — R7309 Other abnormal glucose: Secondary | ICD-10-CM

## 2016-05-07 DIAGNOSIS — Z1239 Encounter for other screening for malignant neoplasm of breast: Secondary | ICD-10-CM

## 2016-05-07 DIAGNOSIS — Z1231 Encounter for screening mammogram for malignant neoplasm of breast: Secondary | ICD-10-CM | POA: Diagnosis not present

## 2016-05-07 NOTE — Telephone Encounter (Signed)
Labs ordered  Pt advised

## 2016-05-07 NOTE — Telephone Encounter (Signed)
OK for CMP, lipid, A1C.

## 2016-05-07 NOTE — Telephone Encounter (Signed)
Wanted to know if there was a lab she needed to complete for you, she didn't remember is she had a lab for her last visit

## 2016-05-22 LAB — LIPID PANEL
CHOL/HDL RATIO: 3.8 ratio (ref <5.0)
Cholesterol: 148 mg/dL (ref <200)
HDL: 39 mg/dL — AB (ref >50)
LDL CALC: 88 mg/dL (ref <100)
TRIGLYCERIDES: 107 mg/dL (ref <150)
VLDL: 21 mg/dL (ref <30)

## 2016-05-22 LAB — COMPLETE METABOLIC PANEL WITH GFR
ALT: 22 U/L (ref 6–29)
AST: 21 U/L (ref 10–35)
Albumin: 3.9 g/dL (ref 3.6–5.1)
Alkaline Phosphatase: 70 U/L (ref 33–115)
BILIRUBIN TOTAL: 0.4 mg/dL (ref 0.2–1.2)
BUN: 12 mg/dL (ref 7–25)
CHLORIDE: 102 mmol/L (ref 98–110)
CO2: 28 mmol/L (ref 20–31)
Calcium: 9.3 mg/dL (ref 8.6–10.2)
Creat: 0.53 mg/dL (ref 0.50–1.10)
GLUCOSE: 89 mg/dL (ref 65–99)
POTASSIUM: 4 mmol/L (ref 3.5–5.3)
SODIUM: 139 mmol/L (ref 135–146)
TOTAL PROTEIN: 6.6 g/dL (ref 6.1–8.1)

## 2016-05-22 LAB — HEMOGLOBIN A1C
HEMOGLOBIN A1C: 4.9 % (ref <5.7)
Mean Plasma Glucose: 94 mg/dL

## 2016-06-11 ENCOUNTER — Ambulatory Visit (INDEPENDENT_AMBULATORY_CARE_PROVIDER_SITE_OTHER): Payer: BLUE CROSS/BLUE SHIELD | Admitting: Family Medicine

## 2016-06-11 ENCOUNTER — Encounter: Payer: Self-pay | Admitting: Family Medicine

## 2016-06-11 DIAGNOSIS — N63 Unspecified lump in unspecified breast: Secondary | ICD-10-CM

## 2016-06-11 DIAGNOSIS — R635 Abnormal weight gain: Secondary | ICD-10-CM

## 2016-06-11 MED ORDER — ESCITALOPRAM OXALATE 10 MG PO TABS: 10.0000 mg | ORAL_TABLET | Freq: Every day | ORAL | 1 refills | Status: DC

## 2016-06-11 MED ORDER — DOXYCYCLINE HYCLATE 100 MG PO TABS: 100.0000 mg | ORAL_TABLET | Freq: Two times a day (BID) | ORAL | 0 refills | Status: DC

## 2016-06-11 MED FILL — DOXYCYCLINE HYCLATE 100 MG: 100 | 10 days supply | Qty: 20 | Fill #0

## 2016-06-11 NOTE — Progress Notes (Signed)
   Subjective:    Patient ID: Stacey Castaneda, female    DOB: 1970/10/22, 46 y.o.   MRN: [id number]  HPI Obesity/BMI 18 - 2 month f/u. She is down 15 lbs ovet the last 2 months. . Her husband has now joined [employer] with her. She is still doing Weight Watchers and this is really helped her with portion control. She was previously walking for exercise. Starting weight 303.    She also complains of a small lump right at the end of her nipple. She says it's been there for probably about 7-8 weeks. She actually went for her mammogram in early January and got a normal report. She says most the time it's not sore bothersome but occasionally if her nipple gets more hardship fill a little discomfort. She did squeeze some blood out of it the other day but otherwise it has not been draining. And she has not seen any increased warmth or heat in the area.   Review of Systems     Objective:   Physical Exam  Constitutional: She is oriented to person, place, and time. She appears well-developed and well-nourished.  HENT:  Head: Normocephalic and atraumatic.  Eyes: Conjunctivae and EOM are normal.  Cardiovascular: Normal rate.   Pulmonary/Chest: Effort normal.  Small module on the right nipple. Soft, nontender. No drainage or discharge.    Neurological: She is alert and oriented to person, place, and time.  Skin: Skin is dry. No pallor.  Psychiatric: She has a normal mood and affect. Her behavior is normal.  Vitals reviewed.       Assessment & Plan:  Obesity/BMI 86 - He is doing fantastic. Down 15 more pounds. Continue with weight watchers and follow-up in 2-3 months.   Lump on the nipple - mammogram is up-to-date. I like to try treating her with doxycycline and warm compress the next 2 weeks. If not resolving then consider referral to the breast center for further evaluation. It may just be that the end of the duct is a little clogged.

## 2016-07-08 ENCOUNTER — Telehealth: Payer: Self-pay

## 2016-07-08 DIAGNOSIS — E041 Nontoxic single thyroid nodule: Secondary | ICD-10-CM

## 2016-07-08 DIAGNOSIS — N63 Unspecified lump in unspecified breast: Secondary | ICD-10-CM

## 2016-07-08 NOTE — Telephone Encounter (Signed)
Ordered. Patient advised.

## 2016-07-08 NOTE — Telephone Encounter (Signed)
Unilateral diag and [place]

## 2016-07-08 NOTE — Telephone Encounter (Signed)
Neck would be to refer her to the breast center for further evaluation and consultation. If she is okay with that,  please place order. I did go ahead and place the order for the thyroid ultrasound to follow-up on the nodules.  Beatrice Lecher, MD

## 2016-07-08 NOTE — Telephone Encounter (Signed)
Stacey Castaneda called and states the area on her breast is better but not resolved. Also she states she is due for follow up on thyroid nodules. Please advise.

## 2016-07-25 ENCOUNTER — Ambulatory Visit
Admission: RE | Admit: 2016-07-25 | Discharge: 2016-07-25 | Disposition: A | Discharge status: Home / Self Care | Payer: BLUE CROSS/BLUE SHIELD | Source: Ambulatory Visit | Attending: Family Medicine | Admitting: Family Medicine

## 2016-07-25 DIAGNOSIS — N63 Unspecified lump in unspecified breast: Secondary | ICD-10-CM

## 2016-07-29 ENCOUNTER — Ambulatory Visit (INDEPENDENT_AMBULATORY_CARE_PROVIDER_SITE_OTHER): Payer: BLUE CROSS/BLUE SHIELD

## 2016-07-29 DIAGNOSIS — E010 Iodine-deficiency related diffuse (endemic) goiter: Secondary | ICD-10-CM

## 2016-07-29 DIAGNOSIS — E041 Nontoxic single thyroid nodule: Secondary | ICD-10-CM

## 2016-08-06 ENCOUNTER — Other Ambulatory Visit: Payer: Self-pay | Admitting: Family Medicine

## 2016-08-06 DIAGNOSIS — E041 Nontoxic single thyroid nodule: Secondary | ICD-10-CM

## 2016-08-14 ENCOUNTER — Ambulatory Visit
Admission: RE | Admit: 2016-08-14 | Discharge: 2016-08-14 | Disposition: A | Discharge status: Home / Self Care | Payer: BLUE CROSS/BLUE SHIELD | Source: Ambulatory Visit | Attending: Family Medicine | Admitting: Family Medicine

## 2016-08-14 ENCOUNTER — Other Ambulatory Visit (HOSPITAL_COMMUNITY)
Admission: RE | Admit: 2016-08-14 | Discharge: 2016-08-14 | Disposition: A | Discharge status: Home / Self Care | Payer: BLUE CROSS/BLUE SHIELD | Source: Ambulatory Visit | Attending: Radiology | Admitting: Radiology

## 2016-08-14 DIAGNOSIS — E041 Nontoxic single thyroid nodule: Secondary | ICD-10-CM | POA: Insufficient documentation

## 2016-09-09 ENCOUNTER — Encounter: Payer: Self-pay | Admitting: Family Medicine

## 2016-09-09 ENCOUNTER — Ambulatory Visit (INDEPENDENT_AMBULATORY_CARE_PROVIDER_SITE_OTHER): Payer: BLUE CROSS/BLUE SHIELD | Admitting: Family Medicine

## 2016-09-09 VITALS — BP 123/63 | HR 62 | Ht 64.0 in | Wt 245.0 lb

## 2016-09-09 DIAGNOSIS — I10 Essential (primary) hypertension: Secondary | ICD-10-CM | POA: Diagnosis not present

## 2016-09-09 DIAGNOSIS — Z6841 Body Mass Index (BMI) 40.0 and over, adult: Secondary | ICD-10-CM

## 2016-09-09 DIAGNOSIS — J4530 Mild persistent asthma, uncomplicated: Secondary | ICD-10-CM | POA: Diagnosis not present

## 2016-09-09 MED ORDER — BUDESONIDE-FORMOTEROL FUMARATE 80-4.5 MCG/ACT IN AERO: 1.0000 | INHALATION_SPRAY | Freq: Two times a day (BID) | RESPIRATORY_TRACT | 1 refills | Status: DC

## 2016-09-09 MED FILL — SYMBICORT 80-4.5 MCG INH: 80-4.5 | 30 days supply | Qty: 10 | Fill #0

## 2016-09-09 NOTE — Progress Notes (Signed)
Upper arm=14.5"  Waist= 49.5"   Hips= 54.5" .Stacey Castaneda

## 2016-09-09 NOTE — Progress Notes (Signed)
Subjective:    Patient ID: Stacey Castaneda, female    DOB: [birth year], 46 y.o.   MRN: [id number]  HPI   3 mo f/u for obesity.  She is really doing fantastic. She is still doing Weight Watchers. She is down another 16 pounds since she was last here in February. She has been feeling great and doing much better.  She would like to [place] follow her hip and waist measurements.    Hypertension-her blood pressures been much better. Previously she was taking HCTZ as needed for swelling and says she hasn't even taken it since January. She still taking 5 mg of amlodipine daily.  Reactive airways disease-she's doing great. She's been on her Symbicort through the winter and now into the spring. She wants to know when she would safely be able to transition to Qvar. She's not having to use her rescue inhaler. She's been able to stay way from any significant respiratory infections.  Says has a small little nodule on the dorsum of her right hand. She noticed it a couple months ago. It's not really painful or significantly bothersome. No soreness or tenderness but she just wanted me to check it out.  Review of Systems  BP 123/63   Pulse 62   Ht 5\' 4"  (1.626 m)   Wt 245 lb (111.1 kg)   SpO2 99%   BMI 42.05 kg/m     Allergies  Allergen Reactions  . Lisinopril Cough    Past Medical History:  Diagnosis Date  . Anxiety   . Depression   . Hypertension   . Perianal fistula   . Perirectal abscess 10/2012  . PONV (postoperative nausea and vomiting)   . Poor venous access    01-26-14 "states having PICC line placed on 01-27-14"  . Seasonal allergic rhinitis   . Thyroid goiter    BENIGN- remains "tested negative"  . Vitreous floaters of left eye    evaluated-not always an issue  . Wears contact lenses     Past Surgical History:  Procedure Laterality Date  . ANAL FISTULECTOMY N/A 01/28/2014   Procedure: LIGATION OF INTERNAL FISTULA TRACT, REMOVAL OF SETON ;  Surgeon: Leighton Ruff, MD;  Location:  [facility];  Service: General;  Laterality: N/A;  . DILATATION OF CERVIX/  REMOVAL AND REPLACEMENT MIRENA  10-22-2010  . DILATION AND CURETTAGE OF UTERUS  1990's  . EVALUATION UNDER ANESTHESIA WITH ANAL FISTULECTOMY N/A 09/03/2013   Procedure: EXAM UNDER ANESTHESIA ;  Surgeon: Leighton Ruff, MD;  Location: [facility];  Service: General;  Laterality: N/A;  . HYSTEROSCOPY W/D&C  08-22-2005  . INCISION AND DRAINAGE PERIRECTAL ABSCESS N/A 10/30/2012   Procedure: peri rectal INCISION AND DRAINAGE ABSCESS;  Surgeon: Earnstine Regal, MD;  Location: [facility];  Service: General;  Laterality: N/A;  . PLACEMENT OF SETON N/A 09/03/2013   Procedure: PLACEMENT OF SETON;  Surgeon: Leighton Ruff, MD;  Location: [facility];  Service: General;  Laterality: N/A;  . WISDOM TOOTH EXTRACTION  AGE 51    Social History   Social History  . Marital status: Married    Spouse name: N/A  . Number of children: N/A  . Years of education: N/A   Occupational History  . Software Testor    Social History Main Topics  . Smoking status: Former Smoker    Packs/day: 1.50    Years: 10.00    Types: Cigarettes    Quit date: 10/31/2006  . Smokeless tobacco: Never Used  . Alcohol use Yes  Comment: RARE  . Drug use: No  . Sexual activity: Yes    Birth control/ protection: IUD   Other Topics Concern  . Not on file   Social History Narrative  . No narrative on file    Family History  Problem Relation Age of Onset  . Diabetes Father   . Heart disease Father   . Hypertension Father   . Diabetes Mother   . Hypertension Mother   . Lung cancer Mother   . Breast cancer Maternal Grandmother     Outpatient Encounter Prescriptions as of 09/09/2016  Medication Sig  . albuterol (PROVENTIL HFA;VENTOLIN HFA) 108 (90 BASE) MCG/ACT inhaler Inhale 2 puffs into the lungs every 4 (four) hours as needed for wheezing or shortness of breath.  Marland Kitchen amLODipine (NORVASC) 5 MG tablet Take 1 tablet (5 mg total) by mouth daily.  . budesonide-formoterol  (SYMBICORT) 80-4.5 MCG/ACT inhaler Inhale 1-2 puffs into the lungs 2 (two) times daily.  . cetirizine (ZYRTEC) 10 MG tablet Take 10 mg by mouth every morning.   . docusate sodium (COLACE) 100 MG capsule Take 100 mg by mouth as needed.   Marland Kitchen escitalopram (LEXAPRO) 10 MG tablet Take 1 tablet (10 mg total) by mouth daily.  . fluticasone (FLONASE) 50 MCG/ACT nasal spray Place 2 sprays into both nostrils every morning. Use two sprays in each nostril daily  . hydrochlorothiazide (HYDRODIURIL) 25 MG tablet Take 1 tablet (25 mg total) by mouth daily. (Patient taking differently: Take 25 mg by mouth as needed. )  . ipratropium (ATROVENT) 0.03 % nasal spray Place 1-2 sprays into both nostrils every 12 (twelve) hours.  . montelukast (SINGULAIR) 10 MG tablet Take 1 tablet (10 mg total) by mouth at bedtime.  . Multiple Vitamins-Minerals (MULTIVITAMIN GUMMIES ADULT PO) Take 2 each by mouth every morning.  Marland Kitchen OVER THE COUNTER MEDICATION Place 1-2 drops into both eyes as needed (dry eyes/ contacts.).   . [DISCONTINUED] budesonide-formoterol (SYMBICORT) 80-4.5 MCG/ACT inhaler Inhale 1-2 puffs into the lungs 2 (two) times daily.  . beclomethasone (QVAR) 40 MCG/ACT inhaler Inhale 2 puffs into the lungs 2 (two) times daily. (Patient not taking: Reported on 06/11/2016)   No facility-administered encounter medications on file as of 09/09/2016.          Objective:   Physical Exam  Constitutional: She is oriented to person, place, and time. She appears well-developed and well-nourished.  HENT:  Head: Normocephalic and atraumatic.  Cardiovascular: Normal rate, regular rhythm and normal heart sounds.   Pulmonary/Chest: Effort normal and breath sounds normal.  Neurological: She is alert and oriented to person, place, and time.  Skin: Skin is warm and dry.  Small approx 2 mm on the dorsum of the right hand between the distal metacarpals.    Psychiatric: She has a normal mood and affect. Her behavior is normal.           Assessment & Plan:  Hypertension-well-controlled. Continue current regimen. Hopefully when I see her back will be able to start weaning her amlodipine. Certainly if she starts to notice any lightheadedness dizziness or low blood pressures and we can always adjust it sooner.  Obesity/BMI 42-she has really done fantastic. Going back to June 2017 her BMI was 51. Continue with Weight Watchers. Her husband is doing that with her as well which I think is very helpful.  Reactive airways disease-I think her significant weight loss is also help her breathing. Encouraged December continue her Symbicort for the rest of the month while  the palm levels are still high and then switch to Qvar at the beginning of June. Hopefully she will be able to maintain on the Qvar for the summer.  Cyst, on the dorsum of the right hand. It may be actually attached to the tendon itself. I'm not sure. And she could certainly see one of our sports medicine docs and have them look at with ultrasound for further evaluation but does not feel like a ganglion cyst or a lipoma.

## 2016-10-21 ENCOUNTER — Other Ambulatory Visit: Payer: Self-pay | Admitting: Family Medicine

## 2016-11-05 ENCOUNTER — Other Ambulatory Visit: Payer: Self-pay | Admitting: Family Medicine

## 2016-12-20 ENCOUNTER — Encounter: Payer: Self-pay | Admitting: Family Medicine

## 2016-12-20 ENCOUNTER — Ambulatory Visit (INDEPENDENT_AMBULATORY_CARE_PROVIDER_SITE_OTHER): Payer: 59 | Admitting: Family Medicine

## 2016-12-20 VITALS — BP 146/68 | HR 70 | Wt 247.0 lb

## 2016-12-20 DIAGNOSIS — Z6841 Body Mass Index (BMI) 40.0 and over, adult: Secondary | ICD-10-CM | POA: Diagnosis not present

## 2016-12-20 DIAGNOSIS — J4540 Moderate persistent asthma, uncomplicated: Secondary | ICD-10-CM | POA: Diagnosis not present

## 2016-12-20 DIAGNOSIS — I10 Essential (primary) hypertension: Secondary | ICD-10-CM

## 2016-12-20 MED ORDER — BECLOMETHASONE DIPROPIONATE 80 MCG/ACT IN AERS: 2.0000 | INHALATION_SPRAY | Freq: Two times a day (BID) | RESPIRATORY_TRACT | 2 refills | Status: DC

## 2016-12-20 NOTE — Progress Notes (Signed)
Subjective:    Patient ID: Stacey Castaneda, female    DOB: [birth year], 46 y.o.   MRN: [id number]  HPI Three-month follow-up for obesity- she is doing well overall. She hasn't been able to lose weight but has maintained over the summer. She says little bit more challenging than she thought it was given to be. Friends have been fighting them out to eat in the restaurant that they're typically visiting really don't offer a lot of healthy food choices. She's tried to make suggestions but they don't seem to want to change with her going. She does feel like emotionally she is doing well. She has still been walking regularly for exercise but has not been able to go to the gym. That she plans on getting back there. Still on Weight Watchers and now her husband has joined to that has been helpful.  Hypertension- Pt denies chest pain, SOB, dizziness, or heart palpitations.  Taking meds as directed w/o problems.  Denies medication side effects. Taking HCTZ just as needed. Currently on 5 mg of amlodipine daily.   Asthma-we had recently is contained Symbicort and switch to Qvar. She's been using the 80 mammogram Qvar. Overall she is doing well but says she still notices occasionally on really hot humid days that she feels like there is a little bit more congestion in the chest area and a little bit harder to breathe.    Review of Systems  BP (!) 146/68   Pulse 70   Wt 247 lb (112 kg)   BMI 42.40 kg/m     Allergies  Allergen Reactions  . Lisinopril Cough    Past Medical History:  Diagnosis Date  . Anxiety   . Depression   . Hypertension   . Perianal fistula   . Perirectal abscess 10/2012  . PONV (postoperative nausea and vomiting)   . Poor venous access    01-26-14 "states having PICC line placed on 01-27-14"  . Seasonal allergic rhinitis   . Thyroid goiter    BENIGN- remains "tested negative"  . Vitreous floaters of left eye    evaluated-not always an issue  . Wears contact lenses      Past Surgical History:  Procedure Laterality Date  . ANAL FISTULECTOMY N/A 01/28/2014   Procedure: LIGATION OF INTERNAL FISTULA TRACT, REMOVAL OF SETON ;  Surgeon: Leighton Ruff, MD;  Location: [facility];  Service: General;  Laterality: N/A;  . DILATATION OF CERVIX/  REMOVAL AND REPLACEMENT MIRENA  10-22-2010  . DILATION AND CURETTAGE OF UTERUS  1990's  . EVALUATION UNDER ANESTHESIA WITH ANAL FISTULECTOMY N/A 09/03/2013   Procedure: EXAM UNDER ANESTHESIA ;  Surgeon: Leighton Ruff, MD;  Location: [facility];  Service: General;  Laterality: N/A;  . HYSTEROSCOPY W/D&C  08-22-2005  . INCISION AND DRAINAGE PERIRECTAL ABSCESS N/A 10/30/2012   Procedure: peri rectal INCISION AND DRAINAGE ABSCESS;  Surgeon: Earnstine Regal, MD;  Location: [facility];  Service: General;  Laterality: N/A;  . PLACEMENT OF SETON N/A 09/03/2013   Procedure: PLACEMENT OF SETON;  Surgeon: Leighton Ruff, MD;  Location: [facility];  Service: General;  Laterality: N/A;  . WISDOM TOOTH EXTRACTION  AGE 71    Social History   Social History  . Marital status: Married    Spouse name: N/A  . Number of children: N/A  . Years of education: N/A   Occupational History  . Software Testor    Social History Main Topics  . Smoking status: Former Smoker    Packs/day:  1.50    Years: 10.00    Types: Cigarettes    Quit date: 10/31/2006  . Smokeless tobacco: Never Used  . Alcohol use Yes     Comment: RARE  . Drug use: No  . Sexual activity: Yes    Birth control/ protection: IUD   Other Topics Concern  . Not on file   Social History Narrative  . No narrative on file    Family History  Problem Relation Age of Onset  . Diabetes Father   . Heart disease Father   . Hypertension Father   . Diabetes Mother   . Hypertension Mother   . Lung cancer Mother   . Breast cancer Maternal Grandmother     Outpatient Encounter Prescriptions as of 12/20/2016  Medication Sig  . albuterol (PROVENTIL HFA;VENTOLIN HFA) 108 (90 BASE) MCG/ACT inhaler  Inhale 2 puffs into the lungs every 4 (four) hours as needed for wheezing or shortness of breath.  Marland Kitchen amLODipine (NORVASC) 5 MG tablet TAKE 1 TABLET DAILY  . cetirizine (ZYRTEC) 10 MG tablet Take 10 mg by mouth every morning.   . docusate sodium (COLACE) 100 MG capsule Take 100 mg by mouth as needed.   Marland Kitchen escitalopram (LEXAPRO) 10 MG tablet TAKE 1 TABLET DAILY  . fluticasone (FLONASE) 50 MCG/ACT nasal spray Place 2 sprays into both nostrils every morning. Use two sprays in each nostril daily  . Multiple Vitamins-Minerals (MULTIVITAMIN GUMMIES ADULT PO) Take 2 each by mouth every morning.  Marland Kitchen OVER THE COUNTER MEDICATION Place 1-2 drops into both eyes as needed (dry eyes/ contacts.).   . [DISCONTINUED] beclomethasone (QVAR) 40 MCG/ACT inhaler Inhale 2 puffs into the lungs 2 (two) times daily.  . beclomethasone (QVAR) 80 MCG/ACT inhaler Inhale 2 puffs into the lungs 2 (two) times daily.  . [DISCONTINUED] budesonide-formoterol (SYMBICORT) 80-4.5 MCG/ACT inhaler Inhale 1-2 puffs into the lungs 2 (two) times daily. (Patient not taking: Reported on 12/20/2016)  . [DISCONTINUED] hydrochlorothiazide (HYDRODIURIL) 25 MG tablet Take 1 tablet (25 mg total) by mouth daily. (Patient taking differently: Take 25 mg by mouth as needed. )  . [DISCONTINUED] IBU 600 MG tablet TAKE 1 TABLET EVERY 6 HOURS AS NEEDED  . [DISCONTINUED] ipratropium (ATROVENT) 0.03 % nasal spray Place 1-2 sprays into both nostrils every 12 (twelve) hours.  . [DISCONTINUED] montelukast (SINGULAIR) 10 MG tablet Take 1 tablet (10 mg total) by mouth at bedtime. (Patient not taking: Reported on 12/20/2016)   No facility-administered encounter medications on file as of 12/20/2016.           Objective:   Physical Exam  Constitutional: She is oriented to person, place, and time. She appears well-developed and well-nourished.  HENT:  Head: Normocephalic and atraumatic.  Cardiovascular: Normal rate, regular rhythm and normal heart sounds.    Pulmonary/Chest: Effort normal and breath sounds normal.  Neurological: She is alert and oriented to person, place, and time.  Skin: Skin is warm and dry.  Psychiatric: She has a normal mood and affect. Her behavior is normal.       Assessment & Plan:  HTN - Uncontrolled today.  We had originally planned to work on weaning down her amlodipine but since her pressures are up a little bit today we'll continue with current regimen for now. Follow-up in 2 months.  Obesity/BMI 42 - right now her biggest barriers are eating out and feeling like sometimes she and her husband actually sabotage each other. We discussed some strategies around this. For example she can ask  for a dog he back at the beginning of the meals and she can go ahead and portion control. She also really wants to get back to the gym and feels like that has really helped her in the past.  Asthma- moderate persistant.  We'll continue with Qvar 80 g. We also discussed pretreating with albuterol 2 puffs before exercise particularly on the very hot and humid days before she takes her walk. Want her to try that for the next month to see if she feels like it makes a big difference in her exercise capacity and endurance.

## 2016-12-24 MED FILL — QVAR REDIHALER 80 MCG/ACT A: 80 | 30 days supply | Qty: 11 | Fill #0

## 2017-01-22 ENCOUNTER — Other Ambulatory Visit: Payer: Self-pay | Admitting: Family Medicine

## 2017-01-22 ENCOUNTER — Other Ambulatory Visit: Payer: Self-pay | Admitting: *Deleted

## 2017-01-22 MED ORDER — ESCITALOPRAM OXALATE 10 MG PO TABS: 10.0000 mg | ORAL_TABLET | Freq: Every day | ORAL | 1 refills | Status: DC

## 2017-01-22 MED ORDER — AMLODIPINE BESYLATE 5 MG PO TABS: 5.0000 mg | ORAL_TABLET | Freq: Every day | ORAL | 1 refills | Status: DC

## 2017-02-20 ENCOUNTER — Encounter: Payer: Self-pay | Admitting: Family Medicine

## 2017-02-20 ENCOUNTER — Ambulatory Visit (INDEPENDENT_AMBULATORY_CARE_PROVIDER_SITE_OTHER): Payer: 59 | Admitting: Family Medicine

## 2017-02-20 VITALS — BP 128/78 | HR 60 | Ht 64.0 in | Wt 233.0 lb

## 2017-02-20 DIAGNOSIS — J4540 Moderate persistent asthma, uncomplicated: Secondary | ICD-10-CM

## 2017-02-20 DIAGNOSIS — I10 Essential (primary) hypertension: Secondary | ICD-10-CM

## 2017-02-20 DIAGNOSIS — R635 Abnormal weight gain: Secondary | ICD-10-CM

## 2017-02-20 DIAGNOSIS — Z6839 Body mass index (BMI) 39.0-39.9, adult: Secondary | ICD-10-CM

## 2017-02-20 MED ORDER — FLUTICASONE PROPIONATE HFA 220 MCG/ACT IN AERO: 2.0000 | INHALATION_SPRAY | Freq: Two times a day (BID) | RESPIRATORY_TRACT | 5 refills | Status: DC

## 2017-02-20 MED ORDER — ALBUTEROL SULFATE HFA 108 (90 BASE) MCG/ACT IN AERS: 2.0000 | INHALATION_SPRAY | RESPIRATORY_TRACT | 99 refills | Status: DC | PRN

## 2017-02-20 MED FILL — VENTOLIN HFA 90 MCG INHALER: 108 (90 BAS | 17 days supply | Qty: 18 | Fill #0

## 2017-02-20 NOTE — Progress Notes (Signed)
Pt asked that she be measured today Bust:48.5" Waist:44.5" Hips: 52" Neck:16.75" R.Arm:15" L.Arm:15.25" R.Leg 22.25" L.Leg 23" .Stacey Castaneda

## 2017-02-20 NOTE — Patient Instructions (Addendum)
OLD:  Upper arm=14.5" Waist= 49.5"     Hips= 54.5"  New:  Bust:48.5" Waist:44.5" Hips:52" Neck:16.75" R.Arm:15" L.Arm:15.25" R.Leg 22.25" L.Leg 23" .Stacey Castaneda

## 2017-02-20 NOTE — Progress Notes (Signed)
Subjective:    Patient ID: Stacey Castaneda, female    DOB: Sep 14, 1970, 46 y.o.   MRN: [id number]  HPI Hypertension- Pt denies chest pain, SOB, dizziness, or heart palpitations.  Taking meds as directed w/o problems.  Denies medication side effects.     Follow-up abnormal weight gain-she is down to 233 pounds today.She is doing well overall. She's been exercising regularly and really been trying to eat more healthy. She has been doing Weight Watchers as well as a similar program through work through her [employer]. She doesn't feel like it's anything she is particular struggling with right now. He did hit a plateau for a period of time but was eventually able to push through that. She has started jogging recently for exercise.  Asthma-she is doing well on Qvar but the last time she went to get it refilled they gave her the [place]. She really doesn't like it because she cannot put her fingers on the topic as it covers the vent holes. So she has to hold it with 2 hands. She is finding this very awkward and then she finds that she doesn't really want to use the medication particularly in the evenings if she is tired and just wants to go to bed. She wonders if she could go back to the original inhaler. Overall she is doing well. She has tried pretreating before exercise with some of the albuterol and it does seem to help her with her endurance during her workout. She just hasn't been doing it consistently. She does not want to decrease the concentration of her inhaled steroid.   Review of Systems  BP 128/78   Pulse 60   Ht 5\' 4"  (1.626 m)   Wt 233 lb (105.7 kg)   SpO2 99%   BMI 39.99 kg/m     Allergies  Allergen Reactions  . Lisinopril Cough    Past Medical History:  Diagnosis Date  . Anxiety   . Depression   . Hypertension   . Perianal fistula   . Perirectal abscess 10/2012  . PONV (postoperative nausea and vomiting)   . Poor venous access    01-26-14 "states  having PICC line placed on 01-27-14"  . Seasonal allergic rhinitis   . Thyroid goiter    BENIGN- remains "tested negative"  . Vitreous floaters of left eye    evaluated-not always an issue  . Wears contact lenses     Past Surgical History:  Procedure Laterality Date  . ANAL FISTULECTOMY N/A 01/28/2014   Procedure: LIGATION OF INTERNAL FISTULA TRACT, REMOVAL OF SETON ;  Surgeon: Leighton Ruff, MD;  Location: [facility];  Service: General;  Laterality: N/A;  . DILATATION OF CERVIX/  REMOVAL AND REPLACEMENT MIRENA  10-22-2010  . DILATION AND CURETTAGE OF UTERUS  1990's  . EVALUATION UNDER ANESTHESIA WITH ANAL FISTULECTOMY N/A 09/03/2013   Procedure: EXAM UNDER ANESTHESIA ;  Surgeon: Leighton Ruff, MD;  Location: [facility];  Service: General;  Laterality: N/A;  . HYSTEROSCOPY W/D&C  08-22-2005  . INCISION AND DRAINAGE PERIRECTAL ABSCESS N/A 10/30/2012   Procedure: peri rectal INCISION AND DRAINAGE ABSCESS;  Surgeon: Earnstine Regal, MD;  Location: [facility];  Service: General;  Laterality: N/A;  . PLACEMENT OF SETON N/A 09/03/2013   Procedure: PLACEMENT OF SETON;  Surgeon: Leighton Ruff, MD;  Location: [facility];  Service: General;  Laterality: N/A;  . WISDOM TOOTH EXTRACTION  AGE 83    Social History   Social History  . Marital  status: Married    Spouse name: N/A  . Number of children: N/A  . Years of education: N/A   Occupational History  . Software Testor    Social History Main Topics  . Smoking status: Former Smoker    Packs/day: 1.50    Years: 10.00    Types: Cigarettes    Quit date: 10/31/2006  . Smokeless tobacco: Never Used  . Alcohol use Yes     Comment: RARE  . Drug use: No  . Sexual activity: Yes    Birth control/ protection: IUD   Other Topics Concern  . Not on file   Social History Narrative  . No narrative on file    Family History  Problem Relation Age of Onset  . Diabetes Father   . Heart disease Father   . Hypertension Father   . Diabetes Mother   . Hypertension  Mother   . Lung cancer Mother   . Breast cancer Maternal Grandmother     Outpatient Encounter Prescriptions as of 02/20/2017  Medication Sig  . albuterol (PROVENTIL HFA;VENTOLIN HFA) 108 (90 Base) MCG/ACT inhaler Inhale 2 puffs into the lungs every 4 (four) hours as needed for wheezing or shortness of breath.  Marland Kitchen amLODipine (NORVASC) 5 MG tablet Take 1 tablet (5 mg total) by mouth daily.  . cetirizine (ZYRTEC) 10 MG tablet Take 10 mg by mouth every morning.   . docusate sodium (COLACE) 100 MG capsule Take 100 mg by mouth daily.  Marland Kitchen escitalopram (LEXAPRO) 10 MG tablet Take 1 tablet (10 mg total) by mouth daily.  . fluticasone (FLONASE) 50 MCG/ACT nasal spray Place 2 sprays into both nostrils as needed.  . Multiple Vitamins-Minerals (MULTIVITAMIN GUMMIES ADULT PO) Take 2 each by mouth every morning.  . [DISCONTINUED] albuterol (PROVENTIL HFA;VENTOLIN HFA) 108 (90 BASE) MCG/ACT inhaler Inhale 2 puffs into the lungs every 4 (four) hours as needed for wheezing or shortness of breath.  . [DISCONTINUED] beclomethasone (QVAR) 80 MCG/ACT inhaler Inhale 2 puffs into the lungs 2 (two) times daily.  . [DISCONTINUED] OVER THE COUNTER MEDICATION Place 1-2 drops into both eyes as needed (dry eyes/ contacts.).   Marland Kitchen fluticasone (FLOVENT HFA) 220 MCG/ACT inhaler Inhale 2 puffs into the lungs 2 (two) times daily.   No facility-administered encounter medications on file as of 02/20/2017.          Objective:   Physical Exam  Constitutional: She is oriented to person, place, and time. She appears well-developed and well-nourished.  HENT:  Head: Normocephalic and atraumatic.  Cardiovascular: Normal rate, regular rhythm and normal heart sounds.   Pulmonary/Chest: Effort normal and breath sounds normal.  Neurological: She is alert and oriented to person, place, and time.  Skin: Skin is warm and dry.  Psychiatric: She has a normal mood and affect. Her behavior is normal.          Assessment & Plan:  HTN  - Well controlled. Continue current regimen. Follow up in  4 months.   Abnormal weight gain/BMI 39-she is actually lost 14 pounds since she was last here in August.  Asthma- Moderate persistent-we'll switch to Flovent 220 mg at bedtime day. This is more like her old inhaler and I think she'll do well on it. We'll continue for the next 3 months and she's doing well at that point in time and consider decreasing dose. Also refilled her albuterol to use as needed and to pretreat before exercise.

## 2017-03-10 MED FILL — QVAR REDIHALER 80 MCG/ACT A: 80 | 30 days supply | Qty: 11 | Fill #1

## 2017-04-08 MED FILL — QVAR REDIHALER 80 MCG/ACT A: 80 | 30 days supply | Qty: 11 | Fill #2

## 2017-04-23 ENCOUNTER — Other Ambulatory Visit: Payer: Self-pay | Admitting: *Deleted

## 2017-04-23 DIAGNOSIS — J4530 Mild persistent asthma, uncomplicated: Secondary | ICD-10-CM

## 2017-04-23 MED ORDER — BECLOMETHASONE DIPROPIONATE 40 MCG/ACT IN AERS: 2.0000 | INHALATION_SPRAY | Freq: Two times a day (BID) | RESPIRATORY_TRACT | 0 refills | Status: DC

## 2017-04-24 ENCOUNTER — Other Ambulatory Visit: Payer: Self-pay | Admitting: Family Medicine

## 2017-04-25 ENCOUNTER — Ambulatory Visit: Payer: 59 | Admitting: Family Medicine

## 2017-04-25 ENCOUNTER — Encounter: Payer: Self-pay | Admitting: Family Medicine

## 2017-04-25 VITALS — BP 124/79 | HR 72 | Temp 98.5°F | Ht 64.0 in | Wt 250.0 lb

## 2017-04-25 DIAGNOSIS — J029 Acute pharyngitis, unspecified: Secondary | ICD-10-CM

## 2017-04-25 DIAGNOSIS — N898 Other specified noninflammatory disorders of vagina: Secondary | ICD-10-CM

## 2017-04-25 LAB — WET PREP FOR TRICH, YEAST, CLUE
MICRO NUMBER: 81439583
SPECIMEN QUALITY 3963: ADEQUATE

## 2017-04-25 LAB — POCT RAPID STREP A (OFFICE): Rapid Strep A Screen: NEGATIVE

## 2017-04-25 NOTE — Progress Notes (Signed)
   Subjective:    Patient ID: Stacey Castaneda, female    DOB: October 08, 1970, 46 y.o.   MRN: [id number]  HPI  46 yo female comes in today for sore throat.   She started feeling bad yesterday around lunchtime with a sore throat.  She says she has had a couple of upper respiratory illnesses since the last time I saw her.  She has not run a fever she still just feels rundown and tired.  No fevers chills or sweats.  She still has just a little bit of congestion in her chest but feels like that is from the previous illness.  Pharyngitis-we will do a strep test today.  Like she has a little swelling on the left side of her throat.  She also c/of o increased vaginal d/c.  She has been using some monistat which helped but just wanted to mention it.     Review of Systems     Objective:   Physical Exam  Constitutional: She is oriented to person, place, and time. She appears well-developed and well-nourished.  HENT:  Head: Normocephalic and atraumatic.  Right Ear: External ear normal.  Left Ear: External ear normal.  Nose: Nose normal.  Mouth/Throat: No oropharyngeal exudate.  TMs and canals are clear. Mild erythema of the posterior pharynx.  Eyes: Conjunctivae and EOM are normal. Pupils are equal, round, and reactive to light.  Neck: Neck supple. No thyromegaly present.  Cardiovascular: Normal rate, regular rhythm and normal heart sounds.  Pulmonary/Chest: Effort normal and breath sounds normal. She has no wheezes.  Lymphadenopathy:    She has no cervical adenopathy.  Neurological: She is alert and oriented to person, place, and time.  Skin: Skin is warm and dry.  Psychiatric: She has a normal mood and affect.          Assessment & Plan:  Pharyngitis -was negative.  Likely viral.  Recommend conservative care.  If she feels like she is getting worse please let us know.  vagtinal d/c- wet prep performed. Will cal with results.

## 2017-05-29 ENCOUNTER — Encounter: Payer: Self-pay | Admitting: Family Medicine

## 2017-05-29 ENCOUNTER — Ambulatory Visit: Payer: 59 | Admitting: Family Medicine

## 2017-05-29 VITALS — BP 126/72 | HR 65 | Ht 64.0 in | Wt 244.0 lb

## 2017-05-29 DIAGNOSIS — J4530 Mild persistent asthma, uncomplicated: Secondary | ICD-10-CM | POA: Diagnosis not present

## 2017-05-29 DIAGNOSIS — E041 Nontoxic single thyroid nodule: Secondary | ICD-10-CM | POA: Diagnosis not present

## 2017-05-29 DIAGNOSIS — I10 Essential (primary) hypertension: Secondary | ICD-10-CM

## 2017-05-29 DIAGNOSIS — Z6841 Body Mass Index (BMI) 40.0 and over, adult: Secondary | ICD-10-CM | POA: Diagnosis not present

## 2017-05-29 DIAGNOSIS — L905 Scar conditions and fibrosis of skin: Secondary | ICD-10-CM

## 2017-05-29 MED ORDER — QVAR REDIHALER 80 MCG/ACT IN AERB: 2.0000 | INHALATION_SPRAY | Freq: Two times a day (BID) | RESPIRATORY_TRACT | 4 refills | Status: DC

## 2017-05-29 NOTE — Progress Notes (Signed)
Pt's measurements today:  Bust: 47" Waist:47.75" Hips:53"  Neck:16.75"  R.Arm:14.5"  L.Arm: 15"  R.Leg: 23.75"  L.Leg:24" .Stacey Castaneda

## 2017-05-29 NOTE — Progress Notes (Signed)
Subjective:    Patient ID: Stacey Castaneda, female    DOB: Feb 04, 1971, 47 y.o.   MRN: [id number]  HPI F/U Asthma -overall is doing okay.  She had a sore throat and upper respiratory infection right at the end of December but feels like she is finally getting over it.  She did have some exposure to mold in her home which also aggravated her breathing but overall she is doing really well on the Qvar and would like to have that sent to mail order.  Obesity-she is doing great.  She is still trying to walk for exercise.  She is been getting a little bit of knee pain but has not kept her from working out.  Sometimes it was feels like a grinding sensation.  She is down about another 6 pounds.  She did have a skin lesion near the left axilla which is still tender.  It started as a cystic nodule and seemed to resolve but is still feels a little tender and she would like me to check it today.  Thyroid nodule -she still has an enlarged thyroid gland and is due for repeat ultrasound in March of this year.  She will be moving in early March so wants to schedule a little later.  Is not having any problems swallowing.  Hypertension- Pt denies chest pain, SOB, dizziness, or heart palpitations.  Taking meds as directed w/o problems.  Denies medication side effects.      Review of Systems  BP 126/72   Pulse 65   Ht 5\' 4"  (1.626 m)   Wt 244 lb (110.7 kg)   SpO2 98%   BMI 41.88 kg/m     Allergies  Allergen Reactions  . Lisinopril Cough    Past Medical History:  Diagnosis Date  . Anxiety   . Depression   . Hypertension   . Perianal fistula   . Perirectal abscess 10/2012  . PONV (postoperative nausea and vomiting)   . Poor venous access    01-26-14 "states having PICC line placed on 01-27-14"  . Seasonal allergic rhinitis   . Thyroid goiter    BENIGN- remains "tested negative"  . Vitreous floaters of left eye    evaluated-not always an issue  . Wears contact lenses     Past Surgical  History:  Procedure Laterality Date  . ANAL FISTULECTOMY N/A 01/28/2014   Procedure: LIGATION OF INTERNAL FISTULA TRACT, REMOVAL OF SETON ;  Surgeon: Leighton Ruff, MD;  Location: [facility];  Service: General;  Laterality: N/A;  . DILATATION OF CERVIX/  REMOVAL AND REPLACEMENT MIRENA  10-22-2010  . DILATION AND CURETTAGE OF UTERUS  1990's  . EVALUATION UNDER ANESTHESIA WITH ANAL FISTULECTOMY N/A 09/03/2013   Procedure: EXAM UNDER ANESTHESIA ;  Surgeon: Leighton Ruff, MD;  Location: [facility];  Service: General;  Laterality: N/A;  . HYSTEROSCOPY W/D&C  08-22-2005  . INCISION AND DRAINAGE PERIRECTAL ABSCESS N/A 10/30/2012   Procedure: peri rectal INCISION AND DRAINAGE ABSCESS;  Surgeon: Earnstine Regal, MD;  Location: [facility];  Service: General;  Laterality: N/A;  . PLACEMENT OF SETON N/A 09/03/2013   Procedure: PLACEMENT OF SETON;  Surgeon: Leighton Ruff, MD;  Location: [facility];  Service: General;  Laterality: N/A;  . WISDOM TOOTH EXTRACTION  AGE 59    Social History   Socioeconomic History  . Marital status: Married    Spouse name: Not on file  . Number of children: Not on file  . Years of education: Not on  file  . Highest education level: Not on file  Social Needs  . Financial resource strain: Not on file  . Food insecurity - worry: Not on file  . Food insecurity - inability: Not on file  . Transportation needs - medical: Not on file  . Transportation needs - non-medical: Not on file  Occupational History  . Occupation: Database administrator  Tobacco Use  . Smoking status: Former Smoker    Packs/day: 1.50    Years: 10.00    Pack years: 15.00    Types: Cigarettes    Last attempt to quit: 10/31/2006    Years since quitting: 10.5  . Smokeless tobacco: Never Used  Substance and Sexual Activity  . Alcohol use: Yes    Comment: RARE  . Drug use: No  . Sexual activity: Yes    Birth control/protection: IUD  Other Topics Concern  . Not on file  Social History Narrative  . Not on file    Family  History  Problem Relation Age of Onset  . Diabetes Father   . Heart disease Father   . Hypertension Father   . Diabetes Mother   . Hypertension Mother   . Lung cancer Mother   . Breast cancer Maternal Grandmother     Outpatient Encounter Medications as of 05/29/2017  Medication Sig  . albuterol (PROVENTIL HFA;VENTOLIN HFA) 108 (90 Base) MCG/ACT inhaler Inhale 2 puffs into the lungs every 4 (four) hours as needed for wheezing or shortness of breath.  Marland Kitchen amLODipine (NORVASC) 5 MG tablet Take 1 tablet (5 mg total) by mouth daily.  . cetirizine (ZYRTEC) 10 MG tablet Take 10 mg by mouth every morning.   . docusate sodium (COLACE) 100 MG capsule Take 100 mg by mouth daily.  Marland Kitchen escitalopram (LEXAPRO) 10 MG tablet Take 1 tablet (10 mg total) by mouth daily.  . fluticasone (FLONASE) 50 MCG/ACT nasal spray Place 2 sprays into both nostrils as needed.  . fluticasone (FLOVENT HFA) 220 MCG/ACT inhaler Inhale 2 puffs into the lungs 2 (two) times daily.  . Multiple Vitamins-Minerals (MULTIVITAMIN GUMMIES ADULT PO) Take 2 each by mouth every morning.  Marland Kitchen QVAR REDIHALER 80 MCG/ACT inhaler Inhale 2 puffs into the lungs 2 (two) times daily.  . [DISCONTINUED] QVAR REDIHALER 80 MCG/ACT inhaler   . [DISCONTINUED] beclomethasone (QVAR) 40 MCG/ACT inhaler Inhale 2 puffs into the lungs 2 (two) times daily.   No facility-administered encounter medications on file as of 05/29/2017.          Objective:   Physical Exam  Constitutional: She is oriented to person, place, and time. She appears well-developed and well-nourished.  HENT:  Head: Normocephalic and atraumatic.  Cardiovascular: Normal rate, regular rhythm and normal heart sounds.  Pulmonary/Chest: Effort normal and breath sounds normal.  Neurological: She is alert and oriented to person, place, and time.  Skin: Skin is warm and dry.  Psychiatric: She has a normal mood and affect. Her behavior is normal.          Assessment & Plan:  Asthma -  moderate persistant.  Continue with current regimen with Qvar.  Prescription sent to mail order.  Thyroid nodule -due for repeat ultrasound in March.  If they are stable then no need for biopsy.  HTN - Well controlled. Continue current regimen. Follow up in  4-6 months.  Due for labs.   BMI 41-she is done great.  She is lost another 6 pounds which is fantastic.  Continue work on [employer] and regular  exercise.  We did go ahead and do measurements today.  Skin lesion -what is left is a scar but it was likely a inflamed or infected cyst previously.

## 2017-05-30 ENCOUNTER — Encounter: Payer: Self-pay | Admitting: Family Medicine

## 2017-05-30 LAB — COMPLETE METABOLIC PANEL WITH GFR
AG RATIO: 1.5 (calc) (ref 1.0–2.5)
ALBUMIN MSPROF: 4 g/dL (ref 3.6–5.1)
ALKALINE PHOSPHATASE (APISO): 68 U/L (ref 33–115)
ALT: 17 U/L (ref 6–29)
AST: 18 U/L (ref 10–35)
BILIRUBIN TOTAL: 0.3 mg/dL (ref 0.2–1.2)
BUN: 12 mg/dL (ref 7–25)
CHLORIDE: 104 mmol/L (ref 98–110)
CO2: 26 mmol/L (ref 20–32)
Calcium: 9 mg/dL (ref 8.6–10.2)
Creat: 0.63 mg/dL (ref 0.50–1.10)
GFR, Est African American: 125 mL/min/{1.73_m2} (ref >=60)
GFR, Est Non African American: 108 mL/min/{1.73_m2} (ref >=60)
GLOBULIN: 2.6 g/dL (ref 1.9–3.7)
Glucose, Bld: 101 mg/dL — ABNORMAL HIGH (ref 65–99)
POTASSIUM: 4.4 mmol/L (ref 3.5–5.3)
Sodium: 137 mmol/L (ref 135–146)
Total Protein: 6.6 g/dL (ref 6.1–8.1)

## 2017-05-30 LAB — LIPID PANEL
CHOL/HDL RATIO: 3.1 (calc) (ref <5.0)
CHOLESTEROL: 169 mg/dL (ref <200)
HDL: 54 mg/dL (ref >50)
LDL Cholesterol (Calc): 102 mg/dL (calc) — ABNORMAL HIGH
Non-HDL Cholesterol (Calc): 115 mg/dL (calc) (ref <130)
Triglycerides: 44 mg/dL (ref <150)

## 2017-05-30 LAB — TSH: TSH: 0.92 mIU/L

## 2017-06-15 ENCOUNTER — Other Ambulatory Visit: Payer: Self-pay | Admitting: Family Medicine

## 2017-09-26 ENCOUNTER — Encounter: Payer: Self-pay | Admitting: Family Medicine

## 2017-09-26 ENCOUNTER — Ambulatory Visit: Payer: 59 | Admitting: Family Medicine

## 2017-09-26 VITALS — BP 138/86 | HR 70 | Ht 64.0 in | Wt 261.0 lb

## 2017-09-26 DIAGNOSIS — J4540 Moderate persistent asthma, uncomplicated: Secondary | ICD-10-CM | POA: Diagnosis not present

## 2017-09-26 DIAGNOSIS — F439 Reaction to severe stress, unspecified: Secondary | ICD-10-CM | POA: Diagnosis not present

## 2017-09-26 DIAGNOSIS — Z6841 Body Mass Index (BMI) 40.0 and over, adult: Secondary | ICD-10-CM

## 2017-09-26 DIAGNOSIS — I10 Essential (primary) hypertension: Secondary | ICD-10-CM

## 2017-09-26 DIAGNOSIS — Z23 Encounter for immunization: Secondary | ICD-10-CM

## 2017-09-26 MED ORDER — ESCITALOPRAM OXALATE 20 MG PO TABS: 20.0000 mg | ORAL_TABLET | Freq: Every day | ORAL | 0 refills | Status: DC

## 2017-09-26 MED ORDER — BUDESONIDE-FORMOTEROL FUMARATE 80-4.5 MCG/ACT IN AERO: 2.0000 | INHALATION_SPRAY | Freq: Two times a day (BID) | RESPIRATORY_TRACT | 6 refills | Status: DC

## 2017-09-26 NOTE — Progress Notes (Signed)
Subjective:    CC: BP  HPI:  Hypertension- Pt denies chest pain, SOB, dizziness, or heart palpitations.  Taking meds as directed w/o problems.  Denies medication side effects.    F/U Asthma - Using rescue inhaler about 3 times per week at the end of April.  A little less in the last couple of weeks.  But she is just noticing a little bit of difference in her capacity to exercise.  They had done some stripping of the floor and had some chemicals when she tried to go to the gym this week.  It was very irritating to her breathing..    WEight gain - she has gained 16 lbs since Jan. She hasn't had as much free time to exercise.  Her left knee has been giving her problems.  Though she has been trying to get to the gym.  She is also had a more stressful job and is taking on a little more responsibility.  She is just noticed that in general she is just feeling a little bit more stressed at times.  She says, hard to describe.  She does not feel depressed.  She is Artie on Lexapro 10 mg daily.  Past medical history, Surgical history, Family history not pertinant except as noted below, Social history, Allergies, and medications have been entered into the medical record, reviewed, and corrections made.   Review of Systems: No fevers, chills, night sweats, weight loss, chest pain, or shortness of breath.   Objective:    General: Well Developed, well nourished, and in no acute distress.  Neuro: Alert and oriented x3, extra-ocular muscles intact, sensation grossly intact.  HEENT: Normocephalic, atraumatic.  Symmetric thyromegaly. Skin: Warm and dry, no rashes. Cardiac: Regular rate and rhythm, no murmurs rubs or gallops, no lower extremity edema.  Respiratory: Clear to auscultation bilaterally. Not using accessory muscles, speaking in full sentences.   Impression and Recommendations:    HTN - Well controlled. Continue current regimen. Follow up in  81months.     Asthma -we discussed going ahead and  increasing her up to Symbicort.  She thinks she still has an old inhaler at home and so we will use that first and then call when she is ready for a new prescription for Symbicort.  Continue to use albuterol as needed.  Follow-up in 4 months.  Continue to work on weight loss.  Weight gain/obesity/BMI 44-continue to work on healthy diet and weight loss.  I am glad she is still exercising.  Acute distress-we discussed options.  Setting limits at work.  Making sure that she has time for herself.  We also discussed trying to increase her Lexapro to 20 mg at least for couple months and see if she feels like that is helpful.  If not then will definitely go back down to 10 mg and look at other strategies.  Tdap given today.   Tdap given today.

## 2017-10-13 ENCOUNTER — Other Ambulatory Visit: Payer: Self-pay | Admitting: Family Medicine

## 2017-10-13 MED ORDER — ALBUTEROL SULFATE HFA 108 (90 BASE) MCG/ACT IN AERS: 2.0000 | INHALATION_SPRAY | RESPIRATORY_TRACT | 2 refills | Status: DC | PRN

## 2017-10-13 MED ORDER — AMLODIPINE BESYLATE 5 MG PO TABS: 5.0000 mg | ORAL_TABLET | Freq: Every day | ORAL | 0 refills | Status: DC

## 2017-10-13 MED ORDER — ALBUTEROL SULFATE HFA 108 (90 BASE) MCG/ACT IN AERS: 2.0000 | INHALATION_SPRAY | RESPIRATORY_TRACT | 99 refills | Status: DC | PRN

## 2017-10-13 NOTE — Addendum Note (Signed)
Addended by: Towana Badger on: 10/13/2017 03:21 PM   Modules accepted: Orders

## 2017-10-24 ENCOUNTER — Other Ambulatory Visit: Payer: Self-pay | Admitting: Family Medicine

## 2017-10-27 ENCOUNTER — Other Ambulatory Visit: Payer: Self-pay | Admitting: Family Medicine

## 2017-10-27 MED FILL — SYMBICORT 80-4.5 MCG INH: 80-4.5 | 30 days supply | Qty: 10 | Fill #0

## 2017-11-19 ENCOUNTER — Other Ambulatory Visit: Payer: Self-pay | Admitting: Family Medicine

## 2017-12-22 MED FILL — SYMBICORT 80-4.5 MCG INH: 80-4.5 | 30 days supply | Qty: 10 | Fill #1

## 2018-01-26 ENCOUNTER — Ambulatory Visit: Payer: 59 | Admitting: Family Medicine

## 2018-02-03 ENCOUNTER — Encounter: Payer: Self-pay | Admitting: Family Medicine

## 2018-02-03 ENCOUNTER — Ambulatory Visit (INDEPENDENT_AMBULATORY_CARE_PROVIDER_SITE_OTHER): Payer: BLUE CROSS/BLUE SHIELD | Admitting: Family Medicine

## 2018-02-03 VITALS — BP 136/82 | HR 83 | Ht 64.0 in | Wt 277.0 lb

## 2018-02-03 DIAGNOSIS — Z23 Encounter for immunization: Secondary | ICD-10-CM | POA: Diagnosis not present

## 2018-02-03 DIAGNOSIS — I10 Essential (primary) hypertension: Secondary | ICD-10-CM | POA: Diagnosis not present

## 2018-02-03 DIAGNOSIS — F418 Other specified anxiety disorders: Secondary | ICD-10-CM | POA: Diagnosis not present

## 2018-02-03 DIAGNOSIS — J4540 Moderate persistent asthma, uncomplicated: Secondary | ICD-10-CM

## 2018-02-03 MED ORDER — BUDESONIDE-FORMOTEROL FUMARATE 160-4.5 MCG/ACT IN AERO: 2.0000 | INHALATION_SPRAY | Freq: Two times a day (BID) | RESPIRATORY_TRACT | 1 refills | Status: DC

## 2018-02-03 MED ORDER — ESCITALOPRAM OXALATE 10 MG PO TABS: 10.0000 mg | ORAL_TABLET | Freq: Every day | ORAL | 0 refills | Status: DC

## 2018-02-03 MED ORDER — AMLODIPINE BESYLATE 5 MG PO TABS: 5.0000 mg | ORAL_TABLET | Freq: Every day | ORAL | 0 refills | Status: DC

## 2018-02-03 MED ORDER — ESCITALOPRAM OXALATE 20 MG PO TABS: 20.0000 mg | ORAL_TABLET | Freq: Every day | ORAL | 0 refills | Status: DC

## 2018-02-03 MED ORDER — BUPROPION HCL ER (XL) 150 MG PO TB24: 150.0000 mg | ORAL_TABLET | Freq: Every day | ORAL | 0 refills | Status: DC

## 2018-02-03 MED FILL — SYMBICORT 160-4.5 MCG INH: 160-4.5 | 30 days supply | Qty: 10 | Fill #0

## 2018-02-03 NOTE — Progress Notes (Signed)
Subjective:    Patient ID: Stacey Castaneda, female    DOB: 1971/03/01, 47 y.o.   MRN: [id number]  HPI Hypertension- Pt denies chest pain, SOB, dizziness, or heart palpitations.  Taking meds as directed w/o problems.  Denies medication side effects.    Asthma, has been using her Symbicort between 1 to 2 puffs twice a day.  Is feel like she is noticed a little worsening in her symptoms is not quite as good as it was at the beginning of the summer.  Follow-up mood-overall she is doing okay.  When I last saw her several months ago we actually went up on her Lexapro 20 mg and she is been on that for about the last 4 months.  She really just has not noticed a big difference in her mood.  Review of Systems  BP 136/82   Pulse 83   Ht 5\' 4"  (1.626 m)   Wt 277 lb (125.6 kg)   SpO2 98%   BMI 47.55 kg/m     Allergies  Allergen Reactions  . Lisinopril Cough    Past Medical History:  Diagnosis Date  . Anxiety   . Depression   . Hypertension   . Perianal fistula   . Perirectal abscess 10/2012  . PONV (postoperative nausea and vomiting)   . Poor venous access    01-26-14 "states having PICC line placed on 01-27-14"  . Seasonal allergic rhinitis   . Thyroid goiter    BENIGN- remains "tested negative"  . Vitreous floaters of left eye    evaluated-not always an issue  . Wears contact lenses     Past Surgical History:  Procedure Laterality Date  . ANAL FISTULECTOMY N/A 01/28/2014   Procedure: LIGATION OF INTERNAL FISTULA TRACT, REMOVAL OF SETON ;  Surgeon: Leighton Ruff, MD;  Location: [facility];  Service: General;  Laterality: N/A;  . DILATATION OF CERVIX/  REMOVAL AND REPLACEMENT MIRENA  10-22-2010  . DILATION AND CURETTAGE OF UTERUS  1990's  . EVALUATION UNDER ANESTHESIA WITH ANAL FISTULECTOMY N/A 09/03/2013   Procedure: EXAM UNDER ANESTHESIA ;  Surgeon: Leighton Ruff, MD;  Location: [facility];  Service: General;  Laterality: N/A;  . HYSTEROSCOPY W/D&C  08-22-2005  . INCISION AND DRAINAGE  PERIRECTAL ABSCESS N/A 10/30/2012   Procedure: peri rectal INCISION AND DRAINAGE ABSCESS;  Surgeon: Earnstine Regal, MD;  Location: [facility];  Service: General;  Laterality: N/A;  . PLACEMENT OF SETON N/A 09/03/2013   Procedure: PLACEMENT OF SETON;  Surgeon: Leighton Ruff, MD;  Location: [facility];  Service: General;  Laterality: N/A;  . WISDOM TOOTH EXTRACTION  AGE 47    Social History   Socioeconomic History  . Marital status: Married    Spouse name: Not on file  . Number of children: Not on file  . Years of education: Not on file  . Highest education level: Not on file  Occupational History  . Occupation: Database administrator  Social Needs  . Financial resource strain: Not on file  . Food insecurity:    Worry: Not on file    Inability: Not on file  . Transportation needs:    Medical: Not on file    Non-medical: Not on file  Tobacco Use  . Smoking status: Former Smoker    Packs/day: 1.50    Years: 10.00    Pack years: 15.00    Types: Cigarettes    Last attempt to quit: 10/31/2006    Years since quitting: 11.2  . Smokeless tobacco:  Never Used  Substance and Sexual Activity  . Alcohol use: Yes    Comment: RARE  . Drug use: No  . Sexual activity: Yes    Birth control/protection: IUD  Lifestyle  . Physical activity:    Days per week: Not on file    Minutes per session: Not on file  . Stress: Not on file  Relationships  . Social connections:    Talks on phone: Not on file    Gets together: Not on file    Attends religious service: Not on file    Active member of club or organization: Not on file    Attends meetings of clubs or organizations: Not on file    Relationship status: Not on file  . Intimate partner violence:    Fear of current or ex partner: Not on file    Emotionally abused: Not on file    Physically abused: Not on file    Forced sexual activity: Not on file  Other Topics Concern  . Not on file  Social History Narrative  . Not on file    Family History   Problem Relation Age of Onset  . Diabetes Father   . Heart disease Father   . Hypertension Father   . Diabetes Mother   . Hypertension Mother   . Lung cancer Mother   . Breast cancer Maternal Grandmother     Outpatient Encounter Medications as of 02/03/2018  Medication Sig  . albuterol (PROVENTIL HFA;VENTOLIN HFA) 108 (90 Base) MCG/ACT inhaler Inhale 2 puffs into the lungs every 4 (four) hours as needed for wheezing or shortness of breath.  Marland Kitchen amLODipine (NORVASC) 5 MG tablet Take 1 tablet (5 mg total) by mouth daily.  . cetirizine (ZYRTEC) 10 MG tablet Take 10 mg by mouth every morning.   . docusate sodium (COLACE) 100 MG capsule Take 100 mg by mouth daily.  Marland Kitchen escitalopram (LEXAPRO) 10 MG tablet Take 1 tablet (10 mg total) by mouth daily.  . fluticasone (FLONASE) 50 MCG/ACT nasal spray Place 2 sprays into both nostrils as needed.  . Multiple Vitamins-Minerals (MULTIVITAMIN GUMMIES ADULT PO) Take 2 each by mouth every morning.  . SYMBICORT 80-4.5 MCG/ACT inhaler INHALE 1 TO 2 PUFFS INTO THE LUNGS 2 TIMES DAILY.  . [DISCONTINUED] amLODipine (NORVASC) 5 MG tablet Take 1 tablet (5 mg total) by mouth daily.  . [DISCONTINUED] escitalopram (LEXAPRO) 20 MG tablet TAKE 1 TABLET BY MOUTH  DAILY  . [DISCONTINUED] escitalopram (LEXAPRO) 20 MG tablet Take 1 tablet (20 mg total) by mouth daily.  . [DISCONTINUED] fluticasone (FLOVENT HFA) 220 MCG/ACT inhaler Inhale 2 puffs into the lungs 2 (two) times daily.  . budesonide-formoterol (SYMBICORT) 160-4.5 MCG/ACT inhaler Inhale 2 puffs into the lungs 2 (two) times daily.  Marland Kitchen buPROPion (WELLBUTRIN XL) 150 MG 24 hr tablet Take 1 tablet (150 mg total) by mouth daily.  . [DISCONTINUED] QVAR REDIHALER 80 MCG/ACT inhaler Inhale 2 puffs into the lungs 2 (two) times daily. (Patient not taking: Reported on 02/03/2018)   No facility-administered encounter medications on file as of 02/03/2018.         Objective:   Physical Exam  Constitutional: She is oriented  to person, place, and time. She appears well-developed and well-nourished.  HENT:  Head: Normocephalic and atraumatic.  Cardiovascular: Normal rate, regular rhythm and normal heart sounds.  Pulmonary/Chest: Effort normal and breath sounds normal.  Neurological: She is alert and oriented to person, place, and time.  Skin: Skin is warm and dry.  Psychiatric: She has a normal mood and affect. Her behavior is normal.        Assessment & Plan:  HTN - Well controlled. Continue current regimen. Follow up in  4-6 months.    Asthma, moderate persistent-a lot of days she is actually taking 2 puffs on her Symbicort so we will go ahead and increase her to Symbicort 160.  New prescription sent to local pharmacy.  In a month or 2 if she is doing better then we can always go back down to the 80mg .   Depression and anxiety-PHQ 9 score of 21 and gad 7 score of 18.  Not well controlled.  We discussed options.  Will decrease Lexapro back down to 10 mg and will add Wellbutrin.  We will try this for 4 to 6 weeks and if she is not reaching a better therapeutic goal then plan will be to switch her to a different SSRI.

## 2018-02-10 ENCOUNTER — Encounter: Payer: Self-pay | Admitting: Family Medicine

## 2018-02-11 NOTE — Telephone Encounter (Signed)
Stacey Castaneda could we maybe reimburse her the $12. This was our fault.

## 2018-03-20 ENCOUNTER — Encounter: Payer: Self-pay | Admitting: Family Medicine

## 2018-03-20 ENCOUNTER — Ambulatory Visit: Payer: BLUE CROSS/BLUE SHIELD | Admitting: Family Medicine

## 2018-03-20 VITALS — BP 136/72 | HR 74 | Ht 64.0 in | Wt 267.0 lb

## 2018-03-20 DIAGNOSIS — I10 Essential (primary) hypertension: Secondary | ICD-10-CM

## 2018-03-20 DIAGNOSIS — F418 Other specified anxiety disorders: Secondary | ICD-10-CM

## 2018-03-20 DIAGNOSIS — J4540 Moderate persistent asthma, uncomplicated: Secondary | ICD-10-CM | POA: Diagnosis not present

## 2018-03-20 MED ORDER — ESCITALOPRAM OXALATE 5 MG PO TABS: 5.0000 mg | ORAL_TABLET | Freq: Every day | ORAL | 0 refills | Status: DC

## 2018-03-20 NOTE — Progress Notes (Signed)
Subjective:    CC: Asthma  HPI: 47 year old female here today for follow-up asthma.  When I last saw her in October we decided to increase her Symbicort dose.  She had noticed that she was using her rescue inhaler more frequently.  Since then she is actually been doing better.  No recent flares or exacerbations.  Hypertension-she is doing well with her blood pressure she has been trying to lose little weight with a keto diet.  She says she is really cut out bread and actually feels good on it.  But she did want to keep an eye on her blood pressure because she does feel like she has been consuming a little more salt than usual.  In regards to her major depressive disorder and anxiety-she is doing better.  At one point had her up to 20 mg of Lexapro but when I saw her back last time we decided to go ahead and taper off since she was doing fairly well.  Past medical history, Surgical history, Family history not pertinant except as noted below, Social history, Allergies, and medications have been entered into the medical record, reviewed, and corrections made.   Review of Systems: No fevers, chills, night sweats, weight loss, chest pain, or shortness of breath.   Objective:    General: Well Developed, well nourished, and in no acute distress.  Neuro: Alert and oriented x3, extra-ocular muscles intact, sensation grossly intact.  HEENT: Normocephalic, atraumatic  Skin: Warm and dry, no rashes. Cardiac: Regular rate and rhythm, no murmurs rubs or gallops, no lower extremity edema.  Respiratory: Clear to auscultation bilaterally. Not using accessory muscles, speaking in full sentences.   Impression and Recommendations:   Asthma, moderate persistent-continue with Symbicort for now for the next couple months.  Follow-up in 2 months.  Continues albuterol if needed.  Hypertension-blood pressure is overall okay.  Not quite to the optimal range but it is okay we will keep an eye on it especially since  she has increased her salt intake.  She is trying to work on some weight loss with a keto diet.  Depression/anxiety-continue with current regimen she is been weaning Lexapro and is down to 5 mg of Lexapro and 150 mg of Wellbutrin.  Will continue current regimen and follow-up in 2 months.

## 2018-05-18 ENCOUNTER — Other Ambulatory Visit: Payer: Self-pay | Admitting: Family Medicine

## 2018-05-29 ENCOUNTER — Ambulatory Visit: Payer: BLUE CROSS/BLUE SHIELD | Admitting: Family Medicine

## 2018-05-29 ENCOUNTER — Encounter: Payer: Self-pay | Admitting: Family Medicine

## 2018-05-29 VITALS — BP 144/73 | HR 80 | Ht 64.0 in

## 2018-05-29 DIAGNOSIS — F418 Other specified anxiety disorders: Secondary | ICD-10-CM

## 2018-05-29 DIAGNOSIS — J4540 Moderate persistent asthma, uncomplicated: Secondary | ICD-10-CM

## 2018-05-29 MED ORDER — SERTRALINE HCL 50 MG PO TABS: ORAL_TABLET | ORAL | 1 refills | Status: DC

## 2018-05-29 MED ORDER — FLUTICASONE FUROATE-VILANTEROL 100-25 MCG/INH IN AEPB: 1.0000 | INHALATION_SPRAY | RESPIRATORY_TRACT | 5 refills | Status: DC

## 2018-05-29 MED FILL — SERTRALINE HCL 50 MG TABLET: 50 | 33 days supply | Qty: 30 | Fill #0

## 2018-05-29 MED FILL — BREO ELLIPTA 100-25 MCG INH: 100-25 | 30 days supply | Qty: 60 | Fill #0

## 2018-05-29 NOTE — Progress Notes (Signed)
Subjective:    CC: And asthma.  HPI:  F/U Mood -follow-up for anxiety and depression.  When I last saw her in October we had discussed options in regards to her medications.  We had increased her Lexapro to 20 mg but she really was not feeling much better so we decided to decrease Lexapro back down to 10 mg and add Wellbutrin.  We discussed that if this did not help her reach therapeutic goal that we would consider completely changing the Lexapro.  F/U asthma, moderate persistant.  Recently increased her Symbicort back in October she had noticed an increase in her symptoms from the summertime.  We had increased her to the 160 mg Symbicort 2 puffs twice a day but says she sometimes forgetting to do the second dose later in the day.  Past medical history, Surgical history, Family history not pertinant except as noted below, Social history, Allergies, and medications have been entered into the medical record, reviewed, and corrections made.   Review of Systems: No fevers, chills, night sweats, weight loss, chest pain, or shortness of breath.   Objective:    General: Well Developed, well nourished, and in no acute distress.  Neuro: Alert and oriented x3, extra-ocular muscles intact, sensation grossly intact.  HEENT: Normocephalic, atraumatic  Skin: Warm and dry, no rashes. Cardiac: Regular rate and rhythm, no murmurs rubs or gallops, no lower extremity edema.  Respiratory: Clear to auscultation bilaterally. Not using accessory muscles, speaking in full sentences.   Impression and Recommendations:    Anxiety/depression-PHQ 9 17 and gad 7 score of 17 which is significant for no improvement on her current regimen.  We discussed options.  We will go ahead and taper off the Lexapro and switch to sertraline.  We will continue with Wellbutrin for now.  I like to see her back in 6 weeks.  In about 3 weeks I would like for her to see me a MyChart related note and let me know how she is doing she should  be up to the 50 mg sertraline by that point and we can make adjustments or even adjust over the phone if needed.  Asthma, moderate persistent-we will try switching to [facility].  We were unable to find a coupon card but hopefully she can print when off.

## 2018-05-29 NOTE — Patient Instructions (Signed)
Cut your Lexapro in half.  Take half a tab daily for 6 days.  Then start the sertraline. You will take half a tab daily of the sertraline for 6 days and then increase to a whole tab. After 2 weeks send me a note on my chart and let me know if you want to stay at that dose, especially you are still getting used to it or if you want to go ahead and go up.

## 2018-06-08 ENCOUNTER — Encounter: Payer: Self-pay | Admitting: Family Medicine

## 2018-07-08 ENCOUNTER — Ambulatory Visit: Payer: BLUE CROSS/BLUE SHIELD | Admitting: Family Medicine

## 2018-07-08 ENCOUNTER — Encounter: Payer: Self-pay | Admitting: Family Medicine

## 2018-07-08 VITALS — BP 142/78 | HR 74 | Ht 64.0 in

## 2018-07-08 DIAGNOSIS — J4540 Moderate persistent asthma, uncomplicated: Secondary | ICD-10-CM

## 2018-07-08 DIAGNOSIS — F40243 Fear of flying: Secondary | ICD-10-CM

## 2018-07-08 DIAGNOSIS — F418 Other specified anxiety disorders: Secondary | ICD-10-CM

## 2018-07-08 DIAGNOSIS — Z124 Encounter for screening for malignant neoplasm of cervix: Secondary | ICD-10-CM

## 2018-07-08 MED ORDER — ALPRAZOLAM 0.25 MG PO TABS: 0.2500 mg | ORAL_TABLET | Freq: Every day | ORAL | 0 refills | Status: DC | PRN

## 2018-07-08 MED ORDER — AMLODIPINE BESYLATE 5 MG PO TABS: 5.0000 mg | ORAL_TABLET | Freq: Every day | ORAL | 3 refills | Status: DC

## 2018-07-08 MED ORDER — BUPROPION HCL ER (XL) 150 MG PO TB24: 150.0000 mg | ORAL_TABLET | Freq: Every day | ORAL | 1 refills | Status: DC

## 2018-07-08 MED ORDER — SERTRALINE HCL 50 MG PO TABS: 50.0000 mg | ORAL_TABLET | Freq: Every day | ORAL | 1 refills | Status: DC

## 2018-07-08 MED ORDER — FLUTICASONE FUROATE-VILANTEROL 100-25 MCG/INH IN AEPB: 1.0000 | INHALATION_SPRAY | RESPIRATORY_TRACT | 3 refills | Status: DC

## 2018-07-08 MED FILL — ALPRAZolam 0.25 MG TABS: 0.25 | 30 days supply | Qty: 30 | Fill #0

## 2018-07-08 NOTE — Progress Notes (Signed)
Subjective:    CC:   HPI:  48 year old female here today to follow-up for anxiety and depression.  Previously been on Lexapro and felt like it really was not very effective.  So at the end of January we decided to switch her to sertraline we wean down the Lexapro and made the transition.  We did decide to continue the Wellbutrin during that time of change.  She denies any negative side effects of the medication and feels like it has been helpful so far.  She said she did miss her dose twice and just wanted me to be aware.  Follow-up asthma, moderate persistent-we also decided to switch her to [facility] when I saw her in January.  She says the first week she took it she was not sure that it was actually working.  After that she felt like she was starting to get some relief.  So for now she is doing well with it  With her new position at work she will be flying a lot and wanted to know if she could have a refill on her Xanax which she has used in the past for flying.  Past medical history, Surgical history, Family history not pertinant except as noted below, Social history, Allergies, and medications have been entered into the medical record, reviewed, and corrections made.   Review of Systems: No fevers, chills, night sweats, weight loss, chest pain, or shortness of breath.   Objective:    General: Well Developed, well nourished, and in no acute distress.  Neuro: Alert and oriented x3, extra-ocular muscles intact, sensation grossly intact.  HEENT: Normocephalic, atraumatic  Skin: Warm and dry, no rashes. Cardiac: Regular rate and rhythm, no murmurs rubs or gallops, no lower extremity edema.  Respiratory: Clear to auscultation bilaterally. Not using accessory muscles, speaking in full sentences.   Impression and Recommendations:    Asthma -moderate persistent-continue with Breo.  Call if any worsening symptoms as we move into the spring which is often times the trigger time of the year for  her.   Depression and Anxiety - feels the sertraline is working better.  PHQ-9 - score of 10 and PHQ-9 score of 11.    We discussed options.  She wants to continue with the 50 mg for least a few more weeks and then consider going up.  She did prefer that we go ahead and send a 90-day supply.  Otherwise I will see her back in 6 weeks.  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

## 2018-07-17 ENCOUNTER — Ambulatory Visit: Payer: BLUE CROSS/BLUE SHIELD | Admitting: Family Medicine

## 2018-07-18 ENCOUNTER — Other Ambulatory Visit: Payer: Self-pay | Admitting: Family Medicine

## 2018-07-21 NOTE — Telephone Encounter (Signed)
Okay to refill the amlodipine and bupropion as below with 30-day to local and 90-day to mail order

## 2018-07-22 ENCOUNTER — Other Ambulatory Visit: Payer: Self-pay | Admitting: *Deleted

## 2018-07-22 MED ORDER — AMLODIPINE BESYLATE 5 MG PO TABS: 5.0000 mg | ORAL_TABLET | Freq: Every day | ORAL | 3 refills | Status: DC

## 2018-07-22 MED ORDER — BUPROPION HCL ER (XL) 150 MG PO TB24: 150.0000 mg | ORAL_TABLET | Freq: Every day | ORAL | 1 refills | Status: DC

## 2018-07-22 NOTE — Telephone Encounter (Signed)
30 day supply sent to [facility]. 90 day supply already sent to mail order. KG LPN

## 2018-07-22 NOTE — Progress Notes (Signed)
Refills sent

## 2018-08-25 ENCOUNTER — Ambulatory Visit (INDEPENDENT_AMBULATORY_CARE_PROVIDER_SITE_OTHER): Payer: BLUE CROSS/BLUE SHIELD | Admitting: Family Medicine

## 2018-08-25 ENCOUNTER — Encounter: Payer: Self-pay | Admitting: Family Medicine

## 2018-08-25 DIAGNOSIS — I10 Essential (primary) hypertension: Secondary | ICD-10-CM | POA: Diagnosis not present

## 2018-08-25 DIAGNOSIS — F418 Other specified anxiety disorders: Secondary | ICD-10-CM | POA: Diagnosis not present

## 2018-08-25 MED ORDER — AMLODIPINE BESYLATE 5 MG PO TABS: 5.0000 mg | ORAL_TABLET | Freq: Every day | ORAL | 3 refills | Status: DC

## 2018-08-25 NOTE — Progress Notes (Signed)
Pt would like to know if Dr. Madilyn Fireman was going to wean her off of the Wellbutrin 150 mg?  Or if she is going to just continue w/sertraline 50 mg? Maryruth Eve, Lahoma Crocker, CMA

## 2018-08-25 NOTE — Progress Notes (Signed)
Virtual Visit via Video Note  I connected with Stacey Castaneda on 08/25/18 at  3:40 PM EDT by a video enabled telemedicine application and verified that I am speaking with the correct person using two identifiers.   I discussed the limitations of evaluation and management by telemedicine and the availability of in person appointments. The patient expressed understanding and agreed to proceed.  Subjective:    CC: 6 wk f/u mood  HPI:  F/U depression/anxiety - 48 year old female here today to follow-up for anxiety and depression.  Previously been on Lexapro and felt like it really was not very effective.  So at the end of January we decided to switch her to sertraline we wean down the Lexapro and made the transition.. currently on 5omg of sertraline. Her symptoms were not optimal when we last met but after discussion decided to stick with the same dose and f/u in 6 weeks.  She is till on the wellbutrin. She is doing well. .    Hypertension- Pt denies chest pain, SOB, dizziness, or heart palpitations.  Taking meds as directed w/o problems.  Denies medication side effects.        Past medical history, Surgical history, Family history not pertinant except as noted below, Social history, Allergies, and medications have been entered into the medical record, reviewed, and corrections made.   Review of Systems: No fevers, chills, night sweats, weight loss, chest pain, or shortness of breath.   Objective:    General: Speaking clearly in complete sentences without any shortness of breath.  Alert and oriented x3.  Normal judgment. No apparent acute distress.    Impression and Recommendations:     Depression /anxiety - we will wean the Wellbutrin to every other day fo r10 days and then stop.  Ok to reastart if feels better on the medications. . Will stay with on the sertraline 61m.  F/u in 3 months.    HTN - will refill her amlodipine.  Not able to check BP at home.    I discussed the  assessment and treatment plan with the patient. The patient was provided an opportunity to ask questions and all were answered. The patient agreed with the plan and demonstrated an understanding of the instructions.   The patient was advised to call back or seek an in-person evaluation if the symptoms worsen or if the condition fails to improve as anticipated.   CBeatrice Lecher MD

## 2018-10-26 ENCOUNTER — Encounter: Payer: Self-pay | Admitting: Family Medicine

## 2018-10-26 ENCOUNTER — Encounter: Payer: BLUE CROSS/BLUE SHIELD | Admitting: Obstetrics & Gynecology

## 2018-10-28 ENCOUNTER — Telehealth: Payer: Self-pay | Admitting: Family Medicine

## 2018-10-28 NOTE — Telephone Encounter (Signed)
See procedure notes for 2015.

## 2018-10-28 NOTE — Telephone Encounter (Signed)
I called [employer] and spoke with Davina and she reports they system does not have a record of a sleep study. To get the CPAP and supplies approved the patient will she will need a sleep study. I do not see a sleep study on file.  Please advise.

## 2018-11-04 ENCOUNTER — Encounter: Payer: Self-pay | Admitting: Family Medicine

## 2018-11-05 ENCOUNTER — Ambulatory Visit: Payer: BLUE CROSS/BLUE SHIELD | Admitting: Family Medicine

## 2018-11-05 ENCOUNTER — Other Ambulatory Visit: Payer: Self-pay

## 2018-11-05 ENCOUNTER — Emergency Department
Admission: EM | Admit: 2018-11-05 | Discharge: 2018-11-05 | Disposition: A | Discharge status: Home / Self Care | Payer: BC Managed Care – PPO | Source: Home / Self Care

## 2018-11-05 DIAGNOSIS — L02211 Cutaneous abscess of abdominal wall: Secondary | ICD-10-CM

## 2018-11-05 DIAGNOSIS — R030 Elevated blood-pressure reading, without diagnosis of hypertension: Secondary | ICD-10-CM

## 2018-11-05 DIAGNOSIS — M62830 Muscle spasm of back: Secondary | ICD-10-CM

## 2018-11-05 MED ORDER — METHOCARBAMOL 500 MG PO TABS: 500.0000 mg | ORAL_TABLET | Freq: Two times a day (BID) | ORAL | 0 refills | Status: DC

## 2018-11-05 MED ORDER — CEPHALEXIN 500 MG PO CAPS: 500.0000 mg | ORAL_CAPSULE | Freq: Three times a day (TID) | ORAL | 0 refills | Status: DC

## 2018-11-05 MED ORDER — FLUCONAZOLE 150 MG PO TABS: 150.0000 mg | ORAL_TABLET | Freq: Once | ORAL | 0 refills | Status: AC

## 2018-11-05 MED ORDER — MUPIROCIN 2 % EX OINT: TOPICAL_OINTMENT | CUTANEOUS | 0 refills | Status: DC

## 2018-11-05 MED FILL — MUPIROCIN 2% OINTMENT: 2 | 10 days supply | Qty: 22 | Fill #0

## 2018-11-05 MED FILL — CEPHALEXIN 500 MG CAPSULE: 500 | 7 days supply | Qty: 21 | Fill #0

## 2018-11-05 MED FILL — FLUCONAZOLE 150 MG TABS: 150 | 2 days supply | Qty: 2 | Fill #0

## 2018-11-05 MED FILL — METHOCARBAMOL 500 MG TABLET: 500 | 7 days supply | Qty: 15 | Fill #0

## 2018-11-05 NOTE — Telephone Encounter (Signed)
Called [employer] and spoke with Cherlynn Kaiser and got the CPAP supplies approved from 11/05/2018 through 11/04/2019. PA approval is [id number].  The best number is [phone].

## 2018-11-05 NOTE — ED Triage Notes (Signed)
Bump on left groin area has been noticed for 2 days.  Small amount of pale yellow drainage 2 days ago.  Today, red and hard.

## 2018-11-05 NOTE — Telephone Encounter (Signed)
Called insurance and spoke with Inez Catalina and she states that a compliance report within the last year. I advised the patient that we are working on the approval and will let he know once we hear back from insurance. She did not have any additional questions.

## 2018-11-05 NOTE — Telephone Encounter (Signed)
Called Melanie at [employer] and she will fax over the compliance report so the patient can get her CPAP supplies.

## 2018-11-05 NOTE — ED Provider Notes (Signed)
[facility]    CSN: [id number] Arrival date & time: 11/05/18  1552     History   Chief Complaint Chief Complaint  Patient presents with  . Abscess  . Back Pain    HPI Stacey Castaneda is a 48 y.o. female.   HPI  Stacey Castaneda is a 48 y.o. female presenting to UC with c/o gradually worsening a painful hard red bump on her Right lower abdomen/groin that started 2 days ago. Pt has a hx of abscesses in the past and wanted to make sure this one was treated quickly before it worsens.  There area did drain a small amount of yellow discharge 2 days ago but none since. Denies fever, chills, n/v/d.  Pt also c/o a few days of worsening lower back pain with Left lower back muscle spasms.  Pain was moderate to severe early but has eased up after setting on a heated seat for about 1 hour. No known injury but she believes pain is from working from home due to Covid-19 and sitting in a different chair than the one she uses at work. Hx of back and neck muscle spasms in the past but not for a few years.  She was on Robaxin previously and did well with it. Denies radiation of pain or numbness in arms or legs. Denies change in bowel or bladder habits.  BP elevated in triage. Denies HA, dizziness, chest pain or SOB. She took her BP medication about 1-2 hours PTA but believes the pain in her back has caused her BP to be elevated.    Past Medical History:  Diagnosis Date  . Anxiety   . Depression   . Hypertension   . Perianal fistula   . Perirectal abscess 10/2012  . PONV (postoperative nausea and vomiting)   . Poor venous access    01-26-14 "states having PICC line placed on 01-27-14"  . Seasonal allergic rhinitis   . Thyroid goiter    BENIGN- remains "tested negative"  . Vitreous floaters of left eye    evaluated-not always an issue  . Wears contact lenses     Patient Active Problem List   Diagnosis Date Noted  . Class 3 severe obesity due to excess calories with serious  comorbidity and body mass index (BMI) of 40.0 to 44.9 in adult ([place]) 09/26/2017  . Abnormal weight gain 10/06/2015  . Reactive airway disease 03/28/2015  . 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 Obstructive sleep apnea 03/24/2014  . 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 Obesity, Class III, BMI 40-49.9 (morbid obesity) ([place]) 02/07/2012  . Hirsutism 11/29/2010  . ANXIETY 02/22/2010  . DEPRESSION 02/22/2010  . Essential hypertension 02/22/2010    Past Surgical History:  Procedure Laterality Date  . ANAL FISTULECTOMY N/A 01/28/2014   Procedure: LIGATION OF INTERNAL FISTULA TRACT, REMOVAL OF SETON ;  Surgeon: Leighton Ruff, MD;  Location: [facility];  Service: General;  Laterality: N/A;  . DILATATION OF CERVIX/  REMOVAL AND REPLACEMENT MIRENA  10-22-2010  . DILATION AND CURETTAGE OF UTERUS  1990's  . EVALUATION UNDER ANESTHESIA WITH ANAL FISTULECTOMY N/A 09/03/2013   Procedure: EXAM UNDER ANESTHESIA ;  Surgeon: Leighton Ruff, MD;  Location: [facility];  Service: General;  Laterality: N/A;  . HYSTEROSCOPY W/D&C  08-22-2005  . INCISION AND DRAINAGE PERIRECTAL ABSCESS N/A 10/30/2012   Procedure: peri rectal INCISION AND DRAINAGE ABSCESS;  Surgeon: Earnstine Regal, MD;  Location: [facility];  Service: General;  Laterality: N/A;  . PLACEMENT OF SETON N/A 09/03/2013   Procedure: PLACEMENT OF SETON;  Surgeon: Leighton Ruff, MD;  Location: [facility];  Service: General;  Laterality: N/A;  . WISDOM TOOTH EXTRACTION  AGE 2    OB History    Gravida  0   Para      Term      Preterm      AB      Living        SAB      TAB      Ectopic      Multiple      Live Births               Home Medications    Prior to Admission medications   Medication Sig Start Date End Date Taking? Authorizing Provider  albuterol (PROVENTIL HFA;VENTOLIN HFA) 108 (90 Base) MCG/ACT inhaler Inhale 2 puffs into the lungs every 4 (four) hours as needed for wheezing or  shortness of breath. 10/13/17   Hali Marry, MD  amLODipine (NORVASC) 5 MG tablet Take 1 tablet (5 mg total) by mouth daily. 08/25/18   Hali Marry, MD  buPROPion (WELLBUTRIN XL) 150 MG 24 hr tablet Take 1 tablet (150 mg total) by mouth daily. 07/22/18   Hali Marry, MD  cephALEXin (KEFLEX) 500 MG capsule Take 1 capsule (500 mg total) by mouth 3 (three) times daily. 11/05/18   Noe Gens, PA-C  cetirizine (ZYRTEC) 10 MG tablet Take 10 mg by mouth every morning.     [provider]  docusate sodium (COLACE) 100 MG capsule Take 100 mg by mouth daily.    [provider]  fluconazole (DIFLUCAN) 150 MG tablet Take 1 tablet (150 mg total) by mouth once for 1 dose. May repeat in 3 days if still having symptoms. 11/05/18 11/05/18  Noe Gens, PA-C  fluticasone furoate-vilanterol (BREO ELLIPTA) 100-25 MCG/INH AEPB Inhale 1 puff into the lungs every morning. 07/08/18   Hali Marry, MD  methocarbamol (ROBAXIN) 500 MG tablet Take 1 tablet (500 mg total) by mouth 2 (two) times daily. 11/05/18   Noe Gens, PA-C  Multiple Vitamins-Minerals (MULTIVITAMIN GUMMIES ADULT PO) Take 2 each by mouth every morning.    [provider]  mupirocin ointment (BACTROBAN) 2 % Apply to wound 3 times daily for 5 days 11/05/18   Noe Gens, PA-C  sertraline (ZOLOFT) 50 MG tablet Take 1 tablet (50 mg total) by mouth daily. 07/08/18   Hali Marry, MD    Family History Family History  Problem Relation Age of Onset  . Diabetes Father   . Heart disease Father   . Hypertension Father   . Diabetes Mother   . Hypertension Mother   . Lung cancer Mother   . Breast cancer Maternal Grandmother     Social History Social History   Tobacco Use  . Smoking status: Former Smoker    Packs/day: 1.50    Years: 10.00    Pack years: 15.00    Types: Cigarettes    Quit date: 10/31/2006    Years since quitting: 12.0  . Smokeless tobacco: Never Used  Substance Use  Topics  . Alcohol use: Yes    Comment: RARE  . Drug use: No     Allergies   Lisinopril   Review of Systems Review of Systems  Constitutional: Negative for chills, diaphoresis, fatigue and fever.  Gastrointestinal: Positive for abdominal pain (Right lower/abscess). Negative for  diarrhea, nausea and vomiting.  Genitourinary: Negative for dysuria, flank pain, frequency and hematuria.  Musculoskeletal: Positive for back pain and myalgias. Negative for arthralgias, neck pain and neck stiffness.  Skin: Positive for color change. Negative for wound.  Neurological: Negative for weakness and numbness.     Physical Exam Triage Vital Signs ED Triage Vitals  Enc Vitals Group     BP 11/05/18 0935 (!) 160/103     Pulse Rate 11/05/18 0935 83     Resp 11/05/18 0935 20     Temp 11/05/18 0935 98 F (36.7 C)     Temp Source 11/05/18 0935 Oral     SpO2 11/05/18 0935 99 %     Weight 11/05/18 0938 290 lb (131.5 kg)     Height 11/05/18 0938 5\' 4"  (1.626 m)     Head Circumference --      Peak Flow --      Pain Score 11/05/18 0937 3     Pain Loc --      Pain Edu? --      Excl. in [place]? --    No data found.  Updated Vital Signs BP (!) 160/103 (BP Location: Right Arm)   Pulse 83   Temp 98 F (36.7 C) (Oral)   Resp 20   Ht 5\' 4"  (1.626 m)   Wt 290 lb (131.5 kg)   SpO2 99%   BMI 49.78 kg/m   Visual Acuity Right Eye Distance:   Left Eye Distance:   Bilateral Distance:    Right Eye Near:   Left Eye Near:    Bilateral Near:     Physical Exam Vitals signs and nursing note reviewed.  Constitutional:      Appearance: Normal appearance. She is well-developed.  HENT:     Head: Normocephalic and atraumatic.     Nose: Nose normal.     Mouth/Throat:     Mouth: Mucous membranes are moist.  Eyes:     Extraocular Movements: Extraocular movements intact.  Neck:     Musculoskeletal: Normal range of motion.  Cardiovascular:     Rate and Rhythm: Normal rate.  Pulmonary:     Effort:  Pulmonary effort is normal. No respiratory distress.  Abdominal:    Musculoskeletal: Normal range of motion.        General: Tenderness present.       Back:  Skin:    General: Skin is warm and dry.     Findings: Abscess and erythema present.          Comments: Right lower abdomen: 1cm erythematous area of induration, centralized scant dried yellow discharge. No fluctuance.   Neurological:     Mental Status: She is alert and oriented to person, place, and time.  Psychiatric:        Behavior: Behavior normal.      UC Treatments / Results  Labs (all labs ordered are listed, but only abnormal results are displayed) Labs Reviewed - No data to display  EKG   Radiology No results found.  Procedures Procedures (including critical care time)  Medications Ordered in UC Medications - No data to display  Initial Impression / Assessment and Plan / UC Course  I have reviewed the triage vital signs and the nursing notes.  Pertinent labs & imaging results that were available during my care of the patient were reviewed by me and considered in my medical decision making (see chart for details).     Skin sore c/w early abscess Will  start pt on antibiotics and encouraged warm compresses. Diflucan prescribed as pt reports frequent yeast infections with antibiotic use.  Back pain w/o red flag symptoms c/w muscle strain/spasm.  Will tx conservatively at this time F/u with PCP as needed.   Final Clinical Impressions(s) / UC Diagnoses   Final diagnoses:  Abscess of skin of abdomen  Muscle spasm of back  Elevated blood pressure reading     Discharge Instructions      You may take 500mg  acetaminophen every 4-6 hours or in combination with ibuprofen 400-600mg  every 6-8 hours as needed for pain and inflammation.  Robaxin (methocarbamol) is a muscle relaxer and may cause drowsiness. Do not drink alcohol, drive, or operate heavy machinery while taking.  Please take antibiotics  as prescribed and be sure to complete entire course even if you start to feel better to ensure infection does not come back.  You may apply damp warm compresses to abscess/skin sore 2-3 times daily for 15-20 minutes at a time trying to encourage area to drain on its own. You may then pat dry the area and apply the antibiotic ointment and a bandage to keep ointment in place.   Please follow up next week if your family doctor if not improving.  If infected area gets worse- larger, more painful, you develop fever, please follow up sooner in urgent care.  Saturday: 9a-4p Sunday: 11a-4p.  Monday-Friday: 8a-6p    ED Prescriptions    Medication Sig Dispense Auth. Provider   cephALEXin (KEFLEX) 500 MG capsule Take 1 capsule (500 mg total) by mouth 3 (three) times daily. 21 capsule Gerarda Fraction, Arlander Gillen O, PA-C   mupirocin ointment (BACTROBAN) 2 % Apply to wound 3 times daily for 5 days 22 g Halil Rentz O, PA-C   methocarbamol (ROBAXIN) 500 MG tablet Take 1 tablet (500 mg total) by mouth 2 (two) times daily. 15 tablet Gerarda Fraction, Keyler Hoge O, PA-C   fluconazole (DIFLUCAN) 150 MG tablet Take 1 tablet (150 mg total) by mouth once for 1 dose. May repeat in 3 days if still having symptoms. 2 tablet Noe Gens, PA-C     Controlled Substance Prescriptions [place] Controlled Substance Registry consulted? Not Applicable   Tyrell Antonio 11/05/18 1347

## 2018-11-05 NOTE — Discharge Instructions (Signed)
°  You may take 500mg  acetaminophen every 4-6 hours or in combination with ibuprofen 400-600mg  every 6-8 hours as needed for pain and inflammation.  Robaxin (methocarbamol) is a muscle relaxer and may cause drowsiness. Do not drink alcohol, drive, or operate heavy machinery while taking.  Please take antibiotics as prescribed and be sure to complete entire course even if you start to feel better to ensure infection does not come back.  You may apply damp warm compresses to abscess/skin sore 2-3 times daily for 15-20 minutes at a time trying to encourage area to drain on its own. You may then pat dry the area and apply the antibiotic ointment and a bandage to keep ointment in place.   Please follow up next week if your family doctor if not improving.  If infected area gets worse- larger, more painful, you develop fever, please follow up sooner in urgent care.  Saturday: 9a-4p Sunday: 11a-4p.  Monday-Friday: 8a-6p

## 2018-11-16 ENCOUNTER — Encounter: Payer: BC Managed Care – PPO | Admitting: Obstetrics & Gynecology

## 2018-11-16 DIAGNOSIS — Z01419 Encounter for gynecological examination (general) (routine) without abnormal findings: Secondary | ICD-10-CM

## 2019-01-04 ENCOUNTER — Encounter: Payer: Self-pay | Admitting: Obstetrics & Gynecology

## 2019-01-04 ENCOUNTER — Other Ambulatory Visit: Payer: Self-pay

## 2019-01-04 ENCOUNTER — Encounter: Payer: Self-pay | Admitting: Family Medicine

## 2019-01-04 ENCOUNTER — Ambulatory Visit (INDEPENDENT_AMBULATORY_CARE_PROVIDER_SITE_OTHER): Payer: BC Managed Care – PPO | Admitting: Family Medicine

## 2019-01-04 ENCOUNTER — Ambulatory Visit: Payer: BC Managed Care – PPO | Admitting: Obstetrics & Gynecology

## 2019-01-04 VITALS — BP 136/84 | HR 100 | Resp 16 | Ht 61.0 in

## 2019-01-04 VITALS — BP 136/84 | HR 103 | Temp 98.3°F | Ht 64.0 in

## 2019-01-04 DIAGNOSIS — I10 Essential (primary) hypertension: Secondary | ICD-10-CM

## 2019-01-04 DIAGNOSIS — Z01419 Encounter for gynecological examination (general) (routine) without abnormal findings: Secondary | ICD-10-CM

## 2019-01-04 DIAGNOSIS — Z1151 Encounter for screening for human papillomavirus (HPV): Secondary | ICD-10-CM | POA: Diagnosis not present

## 2019-01-04 DIAGNOSIS — H8112 Benign paroxysmal vertigo, left ear: Secondary | ICD-10-CM | POA: Diagnosis not present

## 2019-01-04 DIAGNOSIS — R232 Flushing: Secondary | ICD-10-CM | POA: Diagnosis not present

## 2019-01-04 DIAGNOSIS — N951 Menopausal and female climacteric states: Secondary | ICD-10-CM | POA: Diagnosis not present

## 2019-01-04 DIAGNOSIS — Z124 Encounter for screening for malignant neoplasm of cervix: Secondary | ICD-10-CM

## 2019-01-04 NOTE — Progress Notes (Signed)
Pt stated that her insurance has changed and she will require a new Rx for the CPAP because of this.   There was a PA done for her 11/05/2018 that approved her from 11/05/2018-11/04/2019. Maryruth Eve, Lahoma Crocker, CMA

## 2019-01-04 NOTE — Progress Notes (Signed)
Acute Office Visit  Subjective:    Patient ID: Stacey Castaneda, female    DOB: Dec 26, 1970, 48 y.o.   MRN: [id number]  Chief Complaint  Patient presents with  . Hypertension  . Ear Pain    pt reports that for about 2 wks she has felt that she has had some inner ear issues. she stated that when she sits up in the bed she gets dizzy, turning her head from L to R she gets dizzy, and if she gets up too fast she will get dizzy.    HPI Patient is in today for   Hypertension- Pt denies chest pain, SOB, dizziness, or heart palpitations.  Taking meds as directed w/o problems.  Denies medication side effects.    She also c/o ear Right pain - pt reports that for about 2 wks she has felt that she has had some inner ear issues. she stated that when she sits up in the bed she gets dizzy, turning her head from L to R she gets dizzy, and if she gets up too fast she will get dizzy.  The dizziness sensation usually lasts less than 10 seconds when it happens.  No significant sinus congestion fevers or chills.  Past Medical History:  Diagnosis Date  . Anxiety   . Depression   . Hypertension   . Perianal fistula   . Perirectal abscess 10/2012  . PONV (postoperative nausea and vomiting)   . Poor venous access    01-26-14 "states having PICC line placed on 01-27-14"  . Seasonal allergic rhinitis   . Thyroid goiter    BENIGN- remains "tested negative"  . Vitreous floaters of left eye    evaluated-not always an issue  . Wears contact lenses     Past Surgical History:  Procedure Laterality Date  . ANAL FISTULECTOMY N/A 01/28/2014   Procedure: LIGATION OF INTERNAL FISTULA TRACT, REMOVAL OF SETON ;  Surgeon: Leighton Ruff, MD;  Location: [facility];  Service: General;  Laterality: N/A;  . DILATATION OF CERVIX/  REMOVAL AND REPLACEMENT MIRENA  10-22-2010  . DILATION AND CURETTAGE OF UTERUS  1990's  . EVALUATION UNDER ANESTHESIA WITH ANAL FISTULECTOMY N/A 09/03/2013   Procedure: EXAM UNDER ANESTHESIA ;   Surgeon: Leighton Ruff, MD;  Location: [facility];  Service: General;  Laterality: N/A;  . HYSTEROSCOPY W/D&C  08-22-2005  . INCISION AND DRAINAGE PERIRECTAL ABSCESS N/A 10/30/2012   Procedure: peri rectal INCISION AND DRAINAGE ABSCESS;  Surgeon: Earnstine Regal, MD;  Location: [facility];  Service: General;  Laterality: N/A;  . PLACEMENT OF SETON N/A 09/03/2013   Procedure: PLACEMENT OF SETON;  Surgeon: Leighton Ruff, MD;  Location: [facility];  Service: General;  Laterality: N/A;  . WISDOM TOOTH EXTRACTION  AGE 40    Family History  Problem Relation Age of Onset  . Diabetes Father   . Heart disease Father   . Hypertension Father   . Diabetes Mother   . Hypertension Mother   . Lung cancer Mother   . Breast cancer Maternal Grandmother     Social History   Socioeconomic History  . Marital status: Married    Spouse name: Not on file  . Number of children: Not on file  . Years of education: Not on file  . Highest education level: Not on file  Occupational History  . Occupation: Database administrator  Social Needs  . Financial resource strain: Not on file  . Food insecurity    Worry: Not on file  Inability: Not on file  . Transportation needs    Medical: Not on file    Non-medical: Not on file  Tobacco Use  . Smoking status: Former Smoker    Packs/day: 1.50    Years: 10.00    Pack years: 15.00    Types: Cigarettes    Quit date: 10/31/2006    Years since quitting: 12.1  . Smokeless tobacco: Never Used  Substance and Sexual Activity  . Alcohol use: Yes    Comment: RARE  . Drug use: No  . Sexual activity: Yes    Birth control/protection: I.U.D.  Lifestyle  . Physical activity    Days per week: Not on file    Minutes per session: Not on file  . Stress: Not on file  Relationships  . Social Herbalist on phone: Not on file    Gets together: Not on file    Attends religious service: Not on file    Active member of club or organization: Not on file    Attends meetings of clubs  or organizations: Not on file    Relationship status: Not on file  . Intimate partner violence    Fear of current or ex partner: Not on file    Emotionally abused: Not on file    Physically abused: Not on file    Forced sexual activity: Not on file  Other Topics Concern  . Not on file  Social History Narrative  . Not on file    Outpatient Medications Prior to Visit  Medication Sig Dispense Refill  . fluticasone (FLONASE) 50 MCG/ACT nasal spray Place into both nostrils daily.    Marland Kitchen albuterol (PROVENTIL HFA;VENTOLIN HFA) 108 (90 Base) MCG/ACT inhaler Inhale 2 puffs into the lungs every 4 (four) hours as needed for wheezing or shortness of breath. 3 Inhaler 2  . amLODipine (NORVASC) 5 MG tablet Take 1 tablet (5 mg total) by mouth daily. 90 tablet 3  . cetirizine (ZYRTEC) 10 MG tablet Take 10 mg by mouth every morning.     . docusate sodium (COLACE) 100 MG capsule Take 100 mg by mouth daily.    . fluticasone furoate-vilanterol (BREO ELLIPTA) 100-25 MCG/INH AEPB Inhale 1 puff into the lungs every morning. 180 each 3  . Multiple Vitamins-Minerals (MULTIVITAMIN GUMMIES ADULT PO) Take 2 each by mouth every morning.    . sertraline (ZOLOFT) 50 MG tablet Take 1 tablet (50 mg total) by mouth daily. 90 tablet 1  . buPROPion (WELLBUTRIN XL) 150 MG 24 hr tablet Take 1 tablet (150 mg total) by mouth daily. 30 tablet 1  . cephALEXin (KEFLEX) 500 MG capsule Take 1 capsule (500 mg total) by mouth 3 (three) times daily. 21 capsule 0  . methocarbamol (ROBAXIN) 500 MG tablet Take 1 tablet (500 mg total) by mouth 2 (two) times daily. 15 tablet 0  . mupirocin ointment (BACTROBAN) 2 % Apply to wound 3 times daily for 5 days 22 g 0   No facility-administered medications prior to visit.     Allergies  Allergen Reactions  . Lisinopril Cough    ROS     Objective:    Physical Exam  Constitutional: She is oriented to person, place, and time. She appears well-developed and well-nourished.  HENT:  Head:  Normocephalic and atraumatic.  Right Ear: External ear normal.  Left Ear: External ear normal.  Nose: Nose normal.  Mouth/Throat: Oropharynx is clear and moist.  TMs and canals are clear.   Eyes: Pupils  are equal, round, and reactive to light. Conjunctivae and EOM are normal.  Neck: Neck supple. No thyromegaly present.  Cardiovascular: Normal rate, regular rhythm and normal heart sounds.  Pulmonary/Chest: Effort normal and breath sounds normal. She has no wheezes.  Lymphadenopathy:    She has no cervical adenopathy.  Neurological: She is alert and oriented to person, place, and time. No cranial nerve deficit.  Positive Dix-Hallpike maneuver to the left.  She did not have actual nystagmus on exam but it did re-create some of her symptoms.  Skin: Skin is warm and dry.  Psychiatric: She has a normal mood and affect. Her behavior is normal.    BP 136/84   Pulse (!) 103   Temp 98.3 F (36.8 C)   Ht 5\' 4"  (1.626 m)   SpO2 99%   BMI 49.78 kg/m  Wt Readings from Last 3 Encounters:  11/05/18 290 lb (131.5 kg)  03/20/18 267 lb (121.1 kg)  02/03/18 277 lb (125.6 kg)    Health Maintenance Due  Topic Date Due  . HIV Screening  09/08/1985  . PAP SMEAR-Modifier  12/29/2017  . INFLUENZA VACCINE  12/05/2018    There are no preventive care reminders to display for this patient.   Lab Results  Component Value Date   TSH 0.92 05/30/2017   Lab Results  Component Value Date   WBC 8.9 01/26/2014   HGB 14.1 01/26/2014   HCT 41.0 01/26/2014   MCV 85.4 01/26/2014   PLT 281 01/26/2014   Lab Results  Component Value Date   NA 137 05/30/2017   K 4.4 05/30/2017   CO2 26 05/30/2017   GLUCOSE 101 (H) 05/30/2017   BUN 12 05/30/2017   CREATININE 0.63 05/30/2017   BILITOT 0.3 05/30/2017   ALKPHOS 70 05/21/2016   AST 18 05/30/2017   ALT 17 05/30/2017   PROT 6.6 05/30/2017   ALBUMIN 3.9 05/21/2016   CALCIUM 9.0 05/30/2017   ANIONGAP 14 01/26/2014   Lab Results  Component Value Date    CHOL 169 05/30/2017   Lab Results  Component Value Date   HDL 54 05/30/2017   Lab Results  Component Value Date   LDLCALC 102 (H) 05/30/2017   Lab Results  Component Value Date   TRIG 44 05/30/2017   Lab Results  Component Value Date   CHOLHDL 3.1 05/30/2017   Lab Results  Component Value Date   HGBA1C 4.9 05/21/2016       Assessment & Plan:   Problem List Items Addressed This Visit      Cardiovascular and Mediastinum   Essential hypertension    Blood pressure well controlled.  Continue current regimen with still like to see that systolic less than [id number] but it does look better than last time.  Continue current regimen.       Other Visit Diagnoses    BPPV (benign paroxysmal positional vertigo), left    -  Primary     BPPV-discussed diagnosis.  Handout provided on exercises to do on her own for the left side.  If not improving over the next couple of weeks then consider further work-up and/or more formal physical therapy but her symptoms are pretty classic for BPPV.  No significant abnormalities on exam.  No orders of the defined types were placed in this encounter.    Beatrice Lecher, MD

## 2019-01-04 NOTE — Patient Instructions (Signed)
Benign Positional Vertigo Vertigo is the feeling that you or your surroundings are moving when they are not. Benign positional vertigo is the most common form of vertigo. This is usually a harmless condition (benign). This condition is positional. This means that symptoms are triggered by certain movements and positions. This condition can be dangerous if it occurs while you are doing something that could cause harm to you or others. This includes activities such as driving or operating machinery. What are the causes? In many cases, the cause of this condition is not known. It may be caused by a disturbance in an area of the inner ear that helps your brain to sense movement and balance. This disturbance can be caused by:  Viral infection (labyrinthitis).  Head injury.  Repetitive motion, such as jumping, dancing, or running. What increases the risk? You are more likely to develop this condition if:  You are a woman.  You are 50 years of age or older. What are the signs or symptoms? Symptoms of this condition usually happen when you move your head or your eyes in different directions. Symptoms may start suddenly, and usually last for less than a minute. They include:  Loss of balance and falling.  Feeling like you are spinning or moving.  Feeling like your surroundings are spinning or moving.  Nausea and vomiting.  Blurred vision.  Dizziness.  Involuntary eye movement (nystagmus). Symptoms can be mild and cause only minor problems, or they can be severe and interfere with daily life. Episodes of benign positional vertigo may return (recur) over time. Symptoms may improve over time. How is this diagnosed? This condition may be diagnosed based on:  Your medical history.  Physical exam of the head, neck, and ears.  Tests, such as: ? MRI. ? CT scan. ? Eye movement tests. Your health care provider may ask you to change positions quickly while he or she watches you for symptoms  of benign positional vertigo, such as nystagmus. Eye movement may be tested with a variety of exams that are designed to evaluate or stimulate vertigo. ? An electroencephalogram (EEG). This records electrical activity in your brain. ? Hearing tests. You may be referred to a health care provider who specializes in ear, nose, and throat (ENT) problems (otolaryngologist) or a provider who specializes in disorders of the nervous system (neurologist). How is this treated?  This condition may be treated in a session in which your health care provider moves your head in specific positions to adjust your inner ear back to normal. Treatment for this condition may take several sessions. Surgery may be needed in severe cases, but this is rare. In some cases, benign positional vertigo may resolve on its own in 2-4 weeks. Follow these instructions at home: Safety  Move slowly. Avoid sudden body or head movements or certain positions, as told by your health care provider.  Avoid driving until your health care provider says it is safe for you to do so.  Avoid operating heavy machinery until your health care provider says it is safe for you to do so.  Avoid doing any tasks that would be dangerous to you or others if vertigo occurs.  If you have trouble walking or keeping your balance, try using a cane for stability. If you feel dizzy or unstable, sit down right away.  Return to your normal activities as told by your health care provider. Ask your health care provider what activities are safe for you. General instructions  Take over-the-counter   and prescription medicines only as told by your health care provider.  Drink enough fluid to keep your urine pale yellow.  Keep all follow-up visits as told by your health care provider. This is important. Contact a health care provider if:  You have a fever.  Your condition gets worse or you develop new symptoms.  Your family or friends notice any  behavioral changes.  You have nausea or vomiting that gets worse.  You have numbness or a "pins and needles" sensation. Get help right away if you:  Have difficulty speaking or moving.  Are always dizzy.  Faint.  Develop severe headaches.  Have weakness in your legs or arms.  Have changes in your hearing or vision.  Develop a stiff neck.  Develop sensitivity to light. Summary  Vertigo is the feeling that you or your surroundings are moving when they are not. Benign positional vertigo is the most common form of vertigo.  The cause of this condition is not known. It may be caused by a disturbance in an area of the inner ear that helps your brain to sense movement and balance.  Symptoms include loss of balance and falling, feeling that you or your surroundings are moving, nausea and vomiting, and blurred vision.  This condition can be diagnosed based on symptoms, physical exam, and other tests, such as MRI, CT scan, eye movement tests, and hearing tests.  Follow safety instructions as told by your health care provider. You will also be told when to contact your health care provider in case of problems. This information is not intended to replace advice given to you by your health care provider. Make sure you discuss any questions you have with your health care provider. Document Released: 01/28/2006 Document Revised: 10/01/2017 Document Reviewed: 10/01/2017 Elsevier Patient Education  2020 [employer]  

## 2019-01-05 ENCOUNTER — Encounter: Payer: Self-pay | Admitting: Family Medicine

## 2019-01-05 LAB — FOLLICLE STIMULATING HORMONE: FSH: 5.4 m[IU]/mL

## 2019-01-05 NOTE — Assessment & Plan Note (Signed)
Blood pressure well controlled.  Continue current regimen with still like to see that systolic less than [id number] but it does look better than last time.  Continue current regimen.

## 2019-01-06 ENCOUNTER — Telehealth: Payer: Self-pay | Admitting: *Deleted

## 2019-01-06 LAB — CYTOLOGY - PAP
Diagnosis: NEGATIVE
HPV: NOT DETECTED

## 2019-01-06 MED ORDER — MISOPROSTOL 200 MCG PO TABS: ORAL_TABLET | ORAL | 0 refills | Status: DC

## 2019-01-06 MED FILL — miSOPROStol 200 MCG TABS: 200 | 1 days supply | Qty: 2 | Fill #0

## 2019-01-06 NOTE — Telephone Encounter (Signed)
Pt has appt with Dr Hulan Fray 02/01/19 for IUD removal and replacement.  She was given a RX of Cytotec to take PO the night prior to procedure per Dr Gala Romney.

## 2019-01-06 NOTE — Telephone Encounter (Signed)
-----   Message from Guss Bunde, MD sent at 01/05/2019 11:12 AM EDT ----- Please make sure she has an appt for Mirena removal and replacement.  Needs cytotec before.  Thanks,  [facility]

## 2019-01-07 NOTE — Progress Notes (Signed)
Subjective:     Stacey Castaneda is a 48 y.o. female here for a routine exam.  Current complaints: need IUD out and wonders if she is in menopause.  If not, she would like another Mirena placed.     Gynecologic History No LMP recorded. (Menstrual status: IUD). Contraception: IUD Last Pap: 2014. Results were: normal Last mammogram: 2018. Results were: normal  Obstetric History OB History  Gravida Para Term Preterm AB Living  0            SAB TAB Ectopic Multiple Live Births                The following portions of the patient's history were reviewed and updated as appropriate: allergies, current medications, past family history, past medical history, past social history, past surgical history and problem list.  Review of Systems Pertinent items noted in HPI and remainder of comprehensive ROS otherwise negative.    Objective:      Vitals:   01/04/19 1536  BP: 136/84  Pulse: 100  Resp: 16  Height: 5\' 1"  (1.549 m)   Vitals:  WNL General appearance: alert, cooperative and no distress  HEENT: Normocephalic, without obvious abnormality, atraumatic Eyes: negative  Respiratory: Clear to auscultation bilaterally  CV: Regular rate and rhythm  Breasts:  Normal appearance, no masses or tenderness, no nipple retraction or dimpling  GI: Soft, non-tender; bowel sounds normal; no masses,  no organomegaly  GU: External Genitalia:  Tanner V, no lesion Urethra:  No prolapse   Vagina: Pink, normal rugae, no blood or discharge  Cervix: No CMT, no lesion, IUD strings seen  Uterus:  Non tender, limited by habitus  Adnexa: Non tender, limited by habitus  Musculoskeletal: No edema, redness or tenderness in the calves or thighs  Skin: No lesions or rash  Lymphatic: Axillary adenopathy: none     Psychiatric: Normal mood and behavior   Assessment:    Healthy female exam.    Plan:   1.  Pap with co testing 2.  Check FSH.  If not clearly menopausal, will replace IUD.  She has had great  response to her menorrhagia complaints.   3.  Mammogram yearly (ordered)

## 2019-01-29 ENCOUNTER — Telehealth: Payer: Self-pay | Admitting: *Deleted

## 2019-01-29 ENCOUNTER — Other Ambulatory Visit: Payer: Self-pay | Admitting: *Deleted

## 2019-01-29 DIAGNOSIS — G4733 Obstructive sleep apnea (adult) (pediatric): Secondary | ICD-10-CM

## 2019-01-29 MED ORDER — AMBULATORY NON FORMULARY MEDICATION: 99 refills | Status: DC

## 2019-01-29 NOTE — Telephone Encounter (Signed)
Pt's information faxed and confirmation received .Stacey Castaneda, [place]

## 2019-01-29 NOTE — Telephone Encounter (Signed)
Received request for order for cpap supplies for pt. Request required updated Rx and recent chart notes to be faxed.

## 2019-01-31 ENCOUNTER — Encounter: Payer: Self-pay | Admitting: Family Medicine

## 2019-02-01 ENCOUNTER — Encounter: Payer: Self-pay | Admitting: Obstetrics & Gynecology

## 2019-02-01 ENCOUNTER — Other Ambulatory Visit: Payer: Self-pay

## 2019-02-01 ENCOUNTER — Ambulatory Visit: Payer: BC Managed Care – PPO | Admitting: Obstetrics & Gynecology

## 2019-02-01 VITALS — BP 159/92 | HR 94 | Resp 16 | Ht 61.0 in

## 2019-02-01 DIAGNOSIS — Z30432 Encounter for removal of intrauterine contraceptive device: Secondary | ICD-10-CM | POA: Diagnosis not present

## 2019-02-01 DIAGNOSIS — Z30430 Encounter for insertion of intrauterine contraceptive device: Secondary | ICD-10-CM

## 2019-02-01 DIAGNOSIS — Z3202 Encounter for pregnancy test, result negative: Secondary | ICD-10-CM

## 2019-02-01 LAB — POCT URINE PREGNANCY: Preg Test, Ur: NEGATIVE

## 2019-02-01 MED ORDER — BREO ELLIPTA 100-25 MCG/INH IN AEPB: 1.0000 | INHALATION_SPRAY | RESPIRATORY_TRACT | 3 refills | Status: DC

## 2019-02-01 MED ORDER — SERTRALINE HCL 50 MG PO TABS: 50.0000 mg | ORAL_TABLET | Freq: Every day | ORAL | 1 refills | Status: DC

## 2019-02-01 MED ORDER — LEVONORGESTREL 20 MCG/24HR IU IUD
INTRAUTERINE_SYSTEM | Freq: Once | INTRAUTERINE | Status: AC
  Administered 2019-02-01: 15:00:00 via INTRAUTERINE

## 2019-02-02 NOTE — Progress Notes (Signed)
Subjective:    Patient ID: Stacey Castaneda, female    DOB: 1971-01-15, 48 y.o.   MRN: [id number]  HPI   Pt presents for IUD removal and insertion.  The Mirena IUD helped tremendously with her menorrhagia.  Her [place] is 5 and she is not near menopause.  She is not sexually active and not using for birth control   Review of Systems  Constitutional: Negative.   Respiratory: Negative.   Cardiovascular: Negative.   Gastrointestinal: Negative.   Genitourinary: Negative.        Objective:   Physical Exam Vitals signs reviewed.  Constitutional:      General: She is not in acute distress.    Appearance: She is well-developed.  HENT:     Head: Normocephalic and atraumatic.  Eyes:     Conjunctiva/sclera: Conjunctivae normal.  Cardiovascular:     Rate and Rhythm: Normal rate.  Pulmonary:     Effort: Pulmonary effort is normal.  Abdominal:     General: Bowel sounds are normal. There is no distension.     Palpations: Abdomen is soft. There is no mass.     Tenderness: There is no abdominal tenderness. There is no guarding or rebound.  Genitourinary:    General: Normal vulva.     Vagina: No vaginal discharge.  Skin:    General: Skin is warm and dry.  Neurological:     Mental Status: She is alert and oriented to person, place, and time.  Psychiatric:     Comments: Nervous about upcoming procedure.    Vitals:   02/01/19 1403  BP: (!) 159/92  Pulse: 94  Resp: 16  Height: 5\' 1"  (1.549 m)   Assessment & Plan:  48 yo female with history of menorrhagia well controlled with Mirena.  For removal and replacement.  Pt has difficult anatomy and last time required going to the OR.  [facility] PROCEDURE NOTE  Stacey Castaneda is a 48 y.o. G0P0 here for Mirena IUD removal and reinsertion. No GYN concerns.    IUD Removal and Reinsertion  Patient identified, informed consent performed. Discussed risks of irregular bleeding, cramping, infection, malpositioning or misplacement of  the IUD outside the uterus which may require further procedures. Time out was performed. Speculum placed in the vagina. Cervix visualized. Cleaned with Betadine x 2. Grasped anteriorly with a single tooth tenaculum. The strings of the IUD were grasped and pulled using ring forceps. The IUD was successfully removed in its entirety. Uterus sounded to 7 cm. During the procedure the patient moved at critical point during the procedure and lost visualization.  Mirena was in uterus, 2 cm from the fundus, arms were released and then lost visualization.  In spite of multiple attempts to reposition speculum, I was unable to push the Mirena safely to the fundus.  Strings were gut to 3-4 cm.  Bedside US showed Mirena in the endometrial canal about 2 cm from the fundus.   Quinteria and I dicussed that the Mirena can provide good cycle control if slightly malpositioned.  We decided to keep Mirena in place for now and she will come back in 6 weeks to report her bleeding.  If it is not satisfactory, she would like to go to the OR for sedation for next attempt.  The procedure is very painful and she doesn't think she can try in the office again.  25 minutes spent with patient with >50% counseling.  This is over and above the time spent for IUD removal and  insertion.

## 2019-02-10 DIAGNOSIS — G4733 Obstructive sleep apnea (adult) (pediatric): Secondary | ICD-10-CM | POA: Diagnosis not present

## 2019-03-16 ENCOUNTER — Telehealth: Payer: Self-pay | Admitting: *Deleted

## 2019-03-16 NOTE — Telephone Encounter (Signed)
Left patient a message to reschedule appointment for Dr. Gala Romney because she will possibly be out of the office on 03/22/2019.

## 2019-03-22 ENCOUNTER — Ambulatory Visit: Payer: BC Managed Care – PPO | Admitting: Obstetrics & Gynecology

## 2019-03-30 ENCOUNTER — Encounter: Payer: Self-pay | Admitting: Family Medicine

## 2019-03-30 ENCOUNTER — Other Ambulatory Visit: Payer: Self-pay

## 2019-03-30 ENCOUNTER — Ambulatory Visit: Payer: BC Managed Care – PPO | Admitting: Family Medicine

## 2019-03-30 VITALS — BP 146/78 | HR 104 | Ht 61.0 in

## 2019-03-30 DIAGNOSIS — J4530 Mild persistent asthma, uncomplicated: Secondary | ICD-10-CM

## 2019-03-30 DIAGNOSIS — Z0184 Encounter for antibody response examination: Secondary | ICD-10-CM

## 2019-03-30 DIAGNOSIS — I10 Essential (primary) hypertension: Secondary | ICD-10-CM | POA: Diagnosis not present

## 2019-03-30 DIAGNOSIS — F418 Other specified anxiety disorders: Secondary | ICD-10-CM

## 2019-03-30 DIAGNOSIS — Z23 Encounter for immunization: Secondary | ICD-10-CM

## 2019-03-30 MED ORDER — LOSARTAN POTASSIUM 50 MG PO TABS: 50.0000 mg | ORAL_TABLET | Freq: Every day | ORAL | 0 refills | Status: DC

## 2019-03-30 MED FILL — LOSARTAN POTASSIUM 50 MG TA: 50 | 30 days supply | Qty: 30 | Fill #0

## 2019-03-30 NOTE — Assessment & Plan Note (Signed)
Well-controlled today and it was elevated at her GYN appointment in September that she was having a procedure done that day.  Organ to add 50 mg of losartan back to her amlodipine she had taken it a few years ago and did well on it but had never taken the 2 together.  We can always add back HCTZ as needed if as well.  Can have her follow-up in about 2 to 3 weeks for nurse visit.

## 2019-03-30 NOTE — Assessment & Plan Note (Addendum)
Stable on current regimen.  Continue to work on activity level and weight loss.  Vaccine given today will need booster at age 48.

## 2019-03-30 NOTE — Progress Notes (Signed)
Established Patient Office Visit  Subjective:  Patient ID: Stacey Castaneda, female    DOB: 1971/04/03  Age: 48 y.o. MRN: [id number]  CC:  Chief Complaint  Patient presents with  . Hypertension    HPI Stacey Castaneda presents for   Hypertension- Pt denies chest pain, SOB, dizziness, or heart palpitations.  Taking meds as directed w/o problems.  Denies medication side effects.  Pressure was also elevated when she went to the OB/GYN office last month.  But she was also little bit anxious that she was getting her IUD changed out.  F/U Asthma -she is doing okay overall no recent exacerbations.  She has been a little bit more short of breath just because she knows she is deconditioned right now.  She is currently using Breo daily.  F/U depression and anxiety -he has been under little bit more stress recently.  But overall is doing okay.  She is currently on Zoloft 50 mg daily. She has been staying home and staying in bc of Terry.    She did have varicella testing about 4 years ago and is immune but wanted to also get tested for measles mumps and rubella to make sure that she has immune status.  Past Medical History:  Diagnosis Date  . Anxiety   . Depression   . Hypertension   . Perianal fistula   . Perirectal abscess 10/2012  . PONV (postoperative nausea and vomiting)   . Poor venous access    01-26-14 "states having PICC line placed on 01-27-14"  . Seasonal allergic rhinitis   . Thyroid goiter    BENIGN- remains "tested negative"  . Vitreous floaters of left eye    evaluated-not always an issue  . Wears contact lenses     Past Surgical History:  Procedure Laterality Date  . ANAL FISTULECTOMY N/A 01/28/2014   Procedure: LIGATION OF INTERNAL FISTULA TRACT, REMOVAL OF SETON ;  Surgeon: Leighton Ruff, MD;  Location: [facility];  Service: General;  Laterality: N/A;  . DILATATION OF CERVIX/  REMOVAL AND REPLACEMENT MIRENA  10-22-2010  . DILATION AND CURETTAGE OF UTERUS  1990's  .  EVALUATION UNDER ANESTHESIA WITH ANAL FISTULECTOMY N/A 09/03/2013   Procedure: EXAM UNDER ANESTHESIA ;  Surgeon: Leighton Ruff, MD;  Location: [facility];  Service: General;  Laterality: N/A;  . HYSTEROSCOPY W/D&C  08-22-2005  . INCISION AND DRAINAGE PERIRECTAL ABSCESS N/A 10/30/2012   Procedure: peri rectal INCISION AND DRAINAGE ABSCESS;  Surgeon: Earnstine Regal, MD;  Location: [facility];  Service: General;  Laterality: N/A;  . PLACEMENT OF SETON N/A 09/03/2013   Procedure: PLACEMENT OF SETON;  Surgeon: Leighton Ruff, MD;  Location: [facility];  Service: General;  Laterality: N/A;  . WISDOM TOOTH EXTRACTION  AGE 82    Family History  Problem Relation Age of Onset  . Diabetes Father   . Heart disease Father   . Hypertension Father   . Diabetes Mother   . Hypertension Mother   . Lung cancer Mother   . Breast cancer Maternal Grandmother     Social History   Socioeconomic History  . Marital status: Married    Spouse name: Not on file  . Number of children: Not on file  . Years of education: Not on file  . Highest education level: Not on file  Occupational History  . Occupation: Database administrator  Social Needs  . Financial resource strain: Not on file  . Food insecurity    Worry: Not on file  Inability: Not on file  . Transportation needs    Medical: Not on file    Non-medical: Not on file  Tobacco Use  . Smoking status: Former Smoker    Packs/day: 1.50    Years: 10.00    Pack years: 15.00    Types: Cigarettes    Quit date: 10/31/2006    Years since quitting: 12.4  . Smokeless tobacco: Never Used  Substance and Sexual Activity  . Alcohol use: Yes    Comment: RARE  . Drug use: No  . Sexual activity: Yes    Birth control/protection: I.U.D.  Lifestyle  . Physical activity    Days per week: Not on file    Minutes per session: Not on file  . Stress: Not on file  Relationships  . Social Herbalist on phone: Not on file    Gets together: Not on file    Attends religious  service: Not on file    Active member of club or organization: Not on file    Attends meetings of clubs or organizations: Not on file    Relationship status: Not on file  . Intimate partner violence    Fear of current or ex partner: Not on file    Emotionally abused: Not on file    Physically abused: Not on file    Forced sexual activity: Not on file  Other Topics Concern  . Not on file  Social History Narrative  . Not on file    Outpatient Medications Prior to Visit  Medication Sig Dispense Refill  . albuterol (PROVENTIL HFA;VENTOLIN HFA) 108 (90 Base) MCG/ACT inhaler Inhale 2 puffs into the lungs every 4 (four) hours as needed for wheezing or shortness of breath. 3 Inhaler 2  . AMBULATORY NON FORMULARY MEDICATION Medication Name: Please provide face mask, humidier, and all supplies. DX: [facility]  Fax: [phone] ([facility]) 1 Units PRN  . amLODipine (NORVASC) 5 MG tablet Take 1 tablet (5 mg total) by mouth daily. 90 tablet 3  . cetirizine (ZYRTEC) 10 MG tablet Take 10 mg by mouth every morning.     . docusate sodium (COLACE) 100 MG capsule Take 100 mg by mouth daily.    . fluticasone (FLONASE) 50 MCG/ACT nasal spray Place into both nostrils daily.    . fluticasone furoate-vilanterol (BREO ELLIPTA) 100-25 MCG/INH AEPB Inhale 1 puff into the lungs every morning. 180 each 3  . levonorgestrel (MIRENA) 20 MCG/24HR IUD 1 each by Intrauterine route once.    . Multiple Vitamins-Minerals (MULTIVITAMIN GUMMIES ADULT PO) Take 2 each by mouth every morning.    . sertraline (ZOLOFT) 50 MG tablet Take 1 tablet (50 mg total) by mouth daily. 90 tablet 1   No facility-administered medications prior to visit.     Allergies  Allergen Reactions  . Lisinopril Cough    ROS Review of Systems    Objective:    Physical Exam  Constitutional: She is oriented to person, place, and time. She appears well-developed and well-nourished.  HENT:  Head: Normocephalic and atraumatic.  Cardiovascular:  Normal rate, regular rhythm and normal heart sounds.  Pulmonary/Chest: Effort normal and breath sounds normal.  Neurological: She is alert and oriented to person, place, and time.  Skin: Skin is warm and dry.  Psychiatric: She has a normal mood and affect. Her behavior is normal.    BP (!) 146/78   Pulse (!) 104   Ht 5\' 1"  (1.549 m)   SpO2 98%   BMI 54.80  kg/m  Wt Readings from Last 3 Encounters:  11/05/18 290 lb (131.5 kg)  03/20/18 267 lb (121.1 kg)  02/03/18 277 lb (125.6 kg)     There are no preventive care reminders to display for this patient.  There are no preventive care reminders to display for this patient.  Lab Results  Component Value Date   TSH 0.92 05/30/2017   Lab Results  Component Value Date   WBC 8.9 01/26/2014   HGB 14.1 01/26/2014   HCT 41.0 01/26/2014   MCV 85.4 01/26/2014   PLT 281 01/26/2014   Lab Results  Component Value Date   NA 137 05/30/2017   K 4.4 05/30/2017   CO2 26 05/30/2017   GLUCOSE 101 (H) 05/30/2017   BUN 12 05/30/2017   CREATININE 0.63 05/30/2017   BILITOT 0.3 05/30/2017   ALKPHOS 70 05/21/2016   AST 18 05/30/2017   ALT 17 05/30/2017   PROT 6.6 05/30/2017   ALBUMIN 3.9 05/21/2016   CALCIUM 9.0 05/30/2017   ANIONGAP 14 01/26/2014   Lab Results  Component Value Date   CHOL 169 05/30/2017   Lab Results  Component Value Date   HDL 54 05/30/2017   Lab Results  Component Value Date   LDLCALC 102 (H) 05/30/2017   Lab Results  Component Value Date   TRIG 44 05/30/2017   Lab Results  Component Value Date   CHOLHDL 3.1 05/30/2017   Lab Results  Component Value Date   HGBA1C 4.9 05/21/2016      Assessment & Plan:   Problem List Items Addressed This Visit      Cardiovascular and Mediastinum   Essential hypertension - Primary    Well-controlled today and it was elevated at her GYN appointment in September that she was having a procedure done that day.  Organ to add 50 mg of losartan back to her amlodipine  she had taken it a few years ago and did well on it but had never taken the 2 together.  We can always add back HCTZ as needed if as well.  Can have her follow-up in about 2 to 3 weeks for nurse visit.      Relevant Medications   losartan (COZAAR) 50 MG tablet   Other Relevant Orders   COMPLETE METABOLIC PANEL WITH GFR   Lipid panel   Measles/Mumps/Rubella Immunity     Respiratory   Reactive airway disease    Stable on current regimen.  Continue to work on activity level and weight loss.  Vaccine given today will need booster at age 89.        Other   Depression with anxiety    Continue current regimen.  Follow-up in 6 months.       Other Visit Diagnoses    Immunity status testing       Relevant Orders   Measles/Mumps/Rubella Immunity   Need for prophylactic vaccination against Streptococcus pneumoniae (pneumococcus)       Relevant Orders   Pneumococcal polysaccharide vaccine 23-valent greater than or equal to 2yo subcutaneous/IM (Completed)      Meds ordered this encounter  Medications  . losartan (COZAAR) 50 MG tablet    Sig: Take 1 tablet (50 mg total) by mouth daily.    Dispense:  30 tablet    Refill:  0    Follow-up: Return in about 2 weeks (around 04/13/2019) for Nurse visit for BP check on new medication .    Beatrice Lecher, MD

## 2019-03-30 NOTE — Assessment & Plan Note (Addendum)
Continue current regimen.    Follow-up in 6 months.

## 2019-04-26 ENCOUNTER — Other Ambulatory Visit: Payer: Self-pay

## 2019-04-26 ENCOUNTER — Ambulatory Visit (INDEPENDENT_AMBULATORY_CARE_PROVIDER_SITE_OTHER): Payer: BC Managed Care – PPO | Admitting: Family Medicine

## 2019-04-26 VITALS — BP 136/83 | HR 85 | Temp 98.6°F

## 2019-04-26 DIAGNOSIS — I10 Essential (primary) hypertension: Secondary | ICD-10-CM

## 2019-04-26 MED ORDER — LOSARTAN POTASSIUM 100 MG PO TABS: 100.0000 mg | ORAL_TABLET | Freq: Every day | ORAL | 0 refills | Status: DC

## 2019-04-26 MED FILL — LOSARTAN POTASSIUM 100 MG T: 100 | 30 days supply | Qty: 30 | Fill #0

## 2019-04-26 NOTE — Progress Notes (Signed)
Patient in today for BP check. BP at last visit was 146/78. Patient reports she is taking 50 mg losartan with no side effects. . BP in the office today is 136/83 and pulse 85. Pt denies headaches, blurred vision, chest pain and shortness of breath.Spoke with provider , plan to increase losartan to 100 mg and recheck BP in 1 month. Rx will be sent to pharmacy.

## 2019-04-28 ENCOUNTER — Encounter: Payer: Self-pay | Admitting: Family Medicine

## 2019-04-28 NOTE — Progress Notes (Signed)
HTN- agree to incrase losartant to 100mg .  F/U scheduled.   Beatrice Lecher, MD

## 2019-05-21 ENCOUNTER — Ambulatory Visit (INDEPENDENT_AMBULATORY_CARE_PROVIDER_SITE_OTHER): Payer: BC Managed Care – PPO | Admitting: Family Medicine

## 2019-05-21 ENCOUNTER — Other Ambulatory Visit: Payer: Self-pay

## 2019-05-21 VITALS — BP 140/86 | HR 73 | Ht 61.0 in

## 2019-05-21 DIAGNOSIS — I10 Essential (primary) hypertension: Secondary | ICD-10-CM | POA: Diagnosis not present

## 2019-05-21 MED ORDER — AMLODIPINE BESYLATE 10 MG PO TABS: 10.0000 mg | ORAL_TABLET | Freq: Every day | ORAL | 0 refills | Status: DC

## 2019-05-21 MED FILL — AMLODIPINE BESYLATE 10 MG T: 10 | 30 days supply | Qty: 30 | Fill #0

## 2019-05-21 NOTE — Progress Notes (Signed)
Patient in the office for a 1 month blood pressure check. The initial check was 160/101. I let the patient sit for a few minutes before re-checking. She was taking 5 mg of amlodipine and 100 mg of Losartan. PCP increased to 10 mg of Amlodipine.  Recheck was lower but still a little high. Per PCP have patient come back in 2 weeks for a re-check and we can send in 30 days of Amlodipine to [facility]. No other questions at this time

## 2019-05-21 NOTE — Progress Notes (Signed)
Hypertension-agree with above.  We will try titrating the amlodipine up.  Monitor for swelling.  Follow-up 2 weeks to recheck.  Agree with documentation as above.   Beatrice Lecher, MD

## 2019-05-27 ENCOUNTER — Encounter: Payer: Self-pay | Admitting: Obstetrics & Gynecology

## 2019-05-27 ENCOUNTER — Other Ambulatory Visit: Payer: Self-pay

## 2019-05-27 ENCOUNTER — Ambulatory Visit: Payer: BC Managed Care – PPO | Admitting: Obstetrics & Gynecology

## 2019-05-27 VITALS — BP 143/87 | HR 79 | Temp 98.3°F

## 2019-05-27 DIAGNOSIS — T8332XA Displacement of intrauterine contraceptive device, initial encounter: Secondary | ICD-10-CM | POA: Diagnosis not present

## 2019-05-27 DIAGNOSIS — N920 Excessive and frequent menstruation with regular cycle: Secondary | ICD-10-CM | POA: Diagnosis not present

## 2019-05-27 NOTE — Progress Notes (Signed)
   Subjective:    Patient ID: Stacey Castaneda, female    DOB: 09/27/1970, 49 y.o.   MRN: [id number]  HPI  Pt presents for f/u of IUD insertion.  Due to problems with comfort and visulaiztion during insertion, the mirnea could not be pushed to fundus.  Prior to this Mirena she had amenorrhea (was using a Mirena).  Now with this Mirena she has periods every month with 3 days of heavy  It is still much better than prior to any mirena.  She is contemplating Mirena in the OR for the 3 days of menorrhagia  Review of Systems  Constitutional: Negative.   Cardiovascular: Negative.   Gastrointestinal: Negative.   Genitourinary: Positive for menstrual problem.  Psychiatric/Behavioral: Negative.        Objective:   Physical Exam Vitals reviewed. Exam conducted with a chaperone present.  Constitutional:      General: She is not in acute distress.    Appearance: She is well-developed.  HENT:     Head: Normocephalic and atraumatic.  Eyes:     Conjunctiva/sclera: Conjunctivae normal.  Cardiovascular:     Rate and Rhythm: Normal rate.  Pulmonary:     Effort: Pulmonary effort is normal.  Genitourinary:    Comments: Tanner V Blood in vault STrings 2-3 cm  Skin:    General: Skin is warm and dry.  Neurological:     Mental Status: She is alert and oriented to person, place, and time.    Vitals:   05/27/19 1325  BP: (!) 143/87  Pulse: 79  Temp: 98.3 F (36.8 C)      Assessment & Plan:  49 yo female with menorrhagia from malpositioned IUD  1.  Pt is considering removal and replacement in OR.  She is unable to tolerate the procedure in the office.  Pt will let me know by mychart if the menorrhagia becomes bothersome.

## 2019-06-08 ENCOUNTER — Other Ambulatory Visit: Payer: Self-pay

## 2019-06-08 ENCOUNTER — Other Ambulatory Visit: Payer: Self-pay | Admitting: Family Medicine

## 2019-06-08 MED ORDER — LOSARTAN POTASSIUM 100 MG PO TABS: 100.0000 mg | ORAL_TABLET | Freq: Every day | ORAL | 2 refills | Status: DC

## 2019-06-08 MED FILL — LOSARTAN POTASSIUM 100 MG T: 100 | 30 days supply | Qty: 30 | Fill #0

## 2019-06-08 MED FILL — AMLODIPINE BESYLATE 10 MG T: 10 | 30 days supply | Qty: 30 | Fill #0

## 2019-06-11 ENCOUNTER — Other Ambulatory Visit: Payer: Self-pay

## 2019-06-11 ENCOUNTER — Ambulatory Visit (INDEPENDENT_AMBULATORY_CARE_PROVIDER_SITE_OTHER): Payer: BC Managed Care – PPO | Admitting: Family Medicine

## 2019-06-11 ENCOUNTER — Encounter: Payer: Self-pay | Admitting: Family Medicine

## 2019-06-11 VITALS — BP 134/79 | HR 90

## 2019-06-11 DIAGNOSIS — I10 Essential (primary) hypertension: Secondary | ICD-10-CM

## 2019-06-11 MED ORDER — LOSARTAN POTASSIUM 100 MG PO TABS: 100.0000 mg | ORAL_TABLET | Freq: Every day | ORAL | 1 refills | Status: DC

## 2019-06-11 NOTE — Progress Notes (Signed)
Hypertension-here for repeat blood pressure check.  At last visit on January 15 we increased her amlodipine.  Blood pressure looks great today so we will continue with current regimen.  Did go ahead and refill med.  Follow-up with me in 4 months.  Stacey Lecher, MD

## 2019-06-11 NOTE — Progress Notes (Signed)
Patient is here for a NV to check blood pressure. Denies chest pain, h/a's, palpitations, lightheadedness, dizziness, SOB or mood changes with medication. As per patient, taking daily medications as directed. Patient is requesting a 90 day supply for losartan medication to be sent to mail order pharmacy. Rx pended for provider approval. Patient informed to schedule next appointment with provider as directed.

## 2019-06-14 ENCOUNTER — Other Ambulatory Visit: Payer: Self-pay

## 2019-06-14 ENCOUNTER — Telehealth: Payer: Self-pay

## 2019-06-14 MED ORDER — AMLODIPINE BESYLATE 10 MG PO TABS: 10.0000 mg | ORAL_TABLET | Freq: Every day | ORAL | 1 refills | Status: DC

## 2019-06-14 NOTE — Telephone Encounter (Signed)
Pt informed of provider's note. Agreeable with plan to recheck blood pressure in 4 months. At pt's request amlodipine rx sent to mail order pharmacy. Transferred to front desk for scheduling. No other inquiries during call.

## 2019-07-01 ENCOUNTER — Other Ambulatory Visit: Payer: Self-pay | Admitting: Family Medicine

## 2019-10-08 ENCOUNTER — Ambulatory Visit: Payer: BC Managed Care – PPO | Admitting: Family Medicine

## 2019-10-08 ENCOUNTER — Encounter: Payer: Self-pay | Admitting: Family Medicine

## 2019-10-08 VITALS — BP 148/76 | HR 102 | Ht 61.0 in

## 2019-10-08 DIAGNOSIS — R600 Localized edema: Secondary | ICD-10-CM | POA: Diagnosis not present

## 2019-10-08 DIAGNOSIS — F411 Generalized anxiety disorder: Secondary | ICD-10-CM

## 2019-10-08 DIAGNOSIS — F418 Other specified anxiety disorders: Secondary | ICD-10-CM

## 2019-10-08 DIAGNOSIS — I10 Essential (primary) hypertension: Secondary | ICD-10-CM | POA: Diagnosis not present

## 2019-10-08 DIAGNOSIS — J4530 Mild persistent asthma, uncomplicated: Secondary | ICD-10-CM

## 2019-10-08 MED ORDER — HYDROCHLOROTHIAZIDE 12.5 MG PO CAPS: 12.5000 mg | ORAL_CAPSULE | Freq: Every day | ORAL | 0 refills | Status: DC

## 2019-10-08 MED FILL — HYDROCHLOROTHIAZIDE 12.5 MG: 12.5 | 30 days supply | Qty: 30 | Fill #0

## 2019-10-08 NOTE — Assessment & Plan Note (Signed)
Pressure not at goal.  Discussed options.  Because of the ankle swelling and like to try actually decreasing her amlodipine down to 5 mg to see if this helps.  I will also add 12.5 milligrams of hydrochlorothiazide.  She said she had taken it previously and did well with it.  So we will add that back and see if that better controls the swelling as well as her blood pressure.

## 2019-10-08 NOTE — Progress Notes (Signed)
Established Patient Office Visit  Subjective:  Patient ID: Stacey Castaneda, female    DOB: 19-Dec-1970  Age: 49 y.o. MRN: [id number]  CC:  Chief Complaint  Patient presents with  . Hypertension    HPI Stacey Castaneda presents for   Hypertension- Pt denies chest pain, SOB, dizziness, or heart palpitations.  Taking meds as directed w/o problems.  Denies medication side effects.    F/U Asthma -she has noticed she has been a little bit more short of breath recently.  But she admits she has not been as consistent with her inhaler more recently she is tried being more consistent with her Breo.  She also has not been as active and she knows that that contributes to it as well.  She also complains that she has been having some more lower extremity edema recently.  She says it gets better pretty quickly if she is able to raise her feet during lunch then it also comes back pretty quickly when she is up on her feet.  Not currently on a diuretic.  Not had a renal function checked in over a year.   Past Medical History:  Diagnosis Date  . Anxiety   . Depression   . Hypertension   . Perianal fistula   . Perirectal abscess 10/2012  . PONV (postoperative nausea and vomiting)   . Poor venous access    01-26-14 "states having PICC line placed on 01-27-14"  . Seasonal allergic rhinitis   . Thyroid goiter    BENIGN- remains "tested negative"  . Vitreous floaters of left eye    evaluated-not always an issue  . Wears contact lenses     Past Surgical History:  Procedure Laterality Date  . ANAL FISTULECTOMY N/A 01/28/2014   Procedure: LIGATION OF INTERNAL FISTULA TRACT, REMOVAL OF SETON ;  Surgeon: Leighton Ruff, MD;  Location: [facility];  Service: General;  Laterality: N/A;  . DILATATION OF CERVIX/  REMOVAL AND REPLACEMENT MIRENA  10-22-2010  . DILATION AND CURETTAGE OF UTERUS  1990's  . EVALUATION UNDER ANESTHESIA WITH ANAL FISTULECTOMY N/A 09/03/2013   Procedure: EXAM UNDER ANESTHESIA ;  Surgeon:  Leighton Ruff, MD;  Location: [facility];  Service: General;  Laterality: N/A;  . HYSTEROSCOPY WITH D & C  08-22-2005  . INCISION AND DRAINAGE PERIRECTAL ABSCESS N/A 10/30/2012   Procedure: peri rectal INCISION AND DRAINAGE ABSCESS;  Surgeon: Earnstine Regal, MD;  Location: [facility];  Service: General;  Laterality: N/A;  . PLACEMENT OF SETON N/A 09/03/2013   Procedure: PLACEMENT OF SETON;  Surgeon: Leighton Ruff, MD;  Location: [facility];  Service: General;  Laterality: N/A;  . WISDOM TOOTH EXTRACTION  AGE 50    Family History  Problem Relation Age of Onset  . Diabetes Father   . Heart disease Father   . Hypertension Father   . Diabetes Mother   . Hypertension Mother   . Lung cancer Mother   . Breast cancer Maternal Grandmother     Social History   Socioeconomic History  . Marital status: Married    Spouse name: Not on file  . Number of children: Not on file  . Years of education: Not on file  . Highest education level: Not on file  Occupational History  . Occupation: Database administrator  Tobacco Use  . Smoking status: Former Smoker    Packs/day: 1.50    Years: 10.00    Pack years: 15.00    Types: Cigarettes    Quit date: 10/31/2006  Years since quitting: 12.9  . Smokeless tobacco: Never Used  Substance and Sexual Activity  . Alcohol use: Yes    Comment: RARE  . Drug use: No  . Sexual activity: Yes    Birth control/protection: I.U.D.  Other Topics Concern  . Not on file  Social History Narrative  . Not on file   Social Determinants of Health   Financial Resource Strain:   . Difficulty of Paying Living Expenses:   Food Insecurity:   . Worried About Charity fundraiser in the Last Year:   . Arboriculturist in the Last Year:   Transportation Needs:   . Film/video editor (Medical):   Marland Kitchen Lack of Transportation (Non-Medical):   Physical Activity:   . Days of Exercise per Week:   . Minutes of Exercise per Session:   Stress:   . Feeling of Stress :   Social Connections:    . Frequency of Communication with Friends and Family:   . Frequency of Social Gatherings with Friends and Family:   . Attends Religious Services:   . Active Member of Clubs or Organizations:   . Attends Archivist Meetings:   Marland Kitchen Marital Status:   Intimate Partner Violence:   . Fear of Current or Ex-Partner:   . Emotionally Abused:   Marland Kitchen Physically Abused:   . Sexually Abused:     Outpatient Medications Prior to Visit  Medication Sig Dispense Refill  . albuterol (PROVENTIL HFA;VENTOLIN HFA) 108 (90 Base) MCG/ACT inhaler Inhale 2 puffs into the lungs every 4 (four) hours as needed for wheezing or shortness of breath. 3 Inhaler 2  . AMBULATORY NON FORMULARY MEDICATION Medication Name: Please provide face mask, humidier, and all supplies. DX: [facility]  Fax: [phone] ([facility]) 1 Units PRN  . amLODipine (NORVASC) 10 MG tablet Take 1 tablet (10 mg total) by mouth daily. 90 tablet 1  . cetirizine (ZYRTEC) 10 MG tablet Take 10 mg by mouth every morning.     . docusate sodium (COLACE) 100 MG capsule Take 100 mg by mouth daily.    . fluticasone (FLONASE) 50 MCG/ACT nasal spray Place into both nostrils daily.    . fluticasone furoate-vilanterol (BREO ELLIPTA) 100-25 MCG/INH AEPB Inhale 1 puff into the lungs every morning. 180 each 3  . levonorgestrel (MIRENA) 20 MCG/24HR IUD 1 each by Intrauterine route once.    . Multiple Vitamins-Minerals (MULTIVITAMIN GUMMIES ADULT PO) Take 2 each by mouth every morning.    . sertraline (ZOLOFT) 50 MG tablet TAKE 1 TABLET DAILY 90 tablet 1  . losartan (COZAAR) 100 MG tablet Take 1 tablet (100 mg total) by mouth daily. 90 tablet 1   No facility-administered medications prior to visit.    Allergies  Allergen Reactions  . Lisinopril Cough    ROS Review of Systems    Objective:    Physical Exam  Constitutional: She is oriented to person, place, and time. She appears well-developed and well-nourished.  HENT:  Head: Normocephalic and  atraumatic.  Cardiovascular: Normal rate, regular rhythm and normal heart sounds.  Pulmonary/Chest: Effort normal and breath sounds normal.  Musculoskeletal:     Comments: Trace ankle edema bilaterally going into her feet.  Neurological: She is alert and oriented to person, place, and time.  Skin: Skin is warm and dry.  Psychiatric: She has a normal mood and affect. Her behavior is normal.    BP (!) 148/76   Pulse (!) 102   Ht 5\' 1"  (1.549 m)  SpO2 97%   BMI 54.80 kg/m  Wt Readings from Last 3 Encounters:  11/05/18 290 lb (131.5 kg)  03/20/18 267 lb (121.1 kg)  02/03/18 277 lb (125.6 kg)     There are no preventive care reminders to display for this patient.  There are no preventive care reminders to display for this patient.  Lab Results  Component Value Date   TSH 0.92 05/30/2017   Lab Results  Component Value Date   WBC 8.9 01/26/2014   HGB 14.1 01/26/2014   HCT 41.0 01/26/2014   MCV 85.4 01/26/2014   PLT 281 01/26/2014   Lab Results  Component Value Date   NA 137 05/30/2017   K 4.4 05/30/2017   CO2 26 05/30/2017   GLUCOSE 101 (H) 05/30/2017   BUN 12 05/30/2017   CREATININE 0.63 05/30/2017   BILITOT 0.3 05/30/2017   ALKPHOS 70 05/21/2016   AST 18 05/30/2017   ALT 17 05/30/2017   PROT 6.6 05/30/2017   ALBUMIN 3.9 05/21/2016   CALCIUM 9.0 05/30/2017   ANIONGAP 14 01/26/2014   Lab Results  Component Value Date   CHOL 169 05/30/2017   Lab Results  Component Value Date   HDL 54 05/30/2017   Lab Results  Component Value Date   LDLCALC 102 (H) 05/30/2017   Lab Results  Component Value Date   TRIG 44 05/30/2017   Lab Results  Component Value Date   CHOLHDL 3.1 05/30/2017   Lab Results  Component Value Date   HGBA1C 4.9 05/21/2016      Assessment & Plan:   Problem List Items Addressed This Visit      Cardiovascular and Mediastinum   Essential hypertension - Primary    Pressure not at goal.  Discussed options.  Because of the ankle  swelling and like to try actually decreasing her amlodipine down to 5 mg to see if this helps.  I will also add 12.5 milligrams of hydrochlorothiazide.  She said she had taken it previously and did well with it.  So we will add that back and see if that better controls the swelling as well as her blood pressure.      Relevant Medications   hydrochlorothiazide (MICROZIDE) 12.5 MG capsule   Other Relevant Orders   COMPLETE METABOLIC PANEL WITH GFR     Respiratory   Reactive airway disease    Overall doing okay lungs are clear on exam today that she has been a little bit more short of breath.  She has been trying to be more consistent with her Breo.  She can let us know if she is having any increased symptoms or flaring.  But definitely continue to use the Breo consistently especially through the spring and summer.  Also work on gentle increase in activity level as tolerated.        Other   Depression with anxiety   Anxiety state    PHQ-9 score of 12 and GAD-7 score of 8.  Denies thoughts of wanting to harm her self.  She says work is actually going okay but has several big projects coming up all at 1 time and that has been stressful for her she has been worrying about it.  She is currently on Zoloft 50 mg daily.  We discussed possibly increasing that dose if needed.  For now she will can monitor her symptoms but if she feels like things are getting progressively worse she will let me know and we can again we can always  address again at follow-up in 4 weeks.       Other Visit Diagnoses    Bilateral lower extremity edema         Bilateral lower extremity edema-we will add 12.5 mg of hydrochlorothiazide as well as decrease the amlodipine to see if this helps.  She is well overdue for CMP and lipid.  She is not fasting so we will get a cmp today and consider getting the lipid at the follow-up visit.   Meds ordered this encounter  Medications  . hydrochlorothiazide (MICROZIDE) 12.5 MG capsule     Sig: Take 1 capsule (12.5 mg total) by mouth daily.    Dispense:  30 capsule    Refill:  0    Follow-up: Return in about 4 weeks (around 11/05/2019) for Hypertension.    Beatrice Lecher, MD

## 2019-10-08 NOTE — Assessment & Plan Note (Signed)
Overall doing okay lungs are clear on exam today that she has been a little bit more short of breath.  She has been trying to be more consistent with her Breo.  She can let us know if she is having any increased symptoms or flaring.  But definitely continue to use the Breo consistently especially through the spring and summer.  Also work on gentle increase in activity level as tolerated.

## 2019-10-08 NOTE — Progress Notes (Signed)
Pt reports that she continues to experience bilateral swelling in her ankles. She can elevate for about 1 hour during the day but the swelling comes back soon after.

## 2019-10-08 NOTE — Assessment & Plan Note (Signed)
PHQ-9 score of 12 and GAD-7 score of 8.  Denies thoughts of wanting to harm her self.  She says work is actually going okay but has several big projects coming up all at 1 time and that has been stressful for her she has been worrying about it.  She is currently on Zoloft 50 mg daily.  We discussed possibly increasing that dose if needed.  For now she will can monitor her symptoms but if she feels like things are getting progressively worse she will let me know and we can again we can always address again at follow-up in 4 weeks.

## 2019-10-09 LAB — COMPLETE METABOLIC PANEL WITH GFR
AG Ratio: 1.4 (calc) (ref 1.0–2.5)
ALT: 18 U/L (ref 6–29)
AST: 16 U/L (ref 10–35)
Albumin: 3.8 g/dL (ref 3.6–5.1)
Alkaline phosphatase (APISO): 84 U/L (ref 31–125)
BUN: 12 mg/dL (ref 7–25)
CO2: 22 mmol/L (ref 20–32)
Calcium: 8.7 mg/dL (ref 8.6–10.2)
Chloride: 106 mmol/L (ref 98–110)
Creat: 0.69 mg/dL (ref 0.50–1.10)
GFR, Est African American: 118 mL/min/{1.73_m2} (ref >=60)
GFR, Est Non African American: 102 mL/min/{1.73_m2} (ref >=60)
Globulin: 2.8 g/dL (calc) (ref 1.9–3.7)
Glucose, Bld: 120 mg/dL — ABNORMAL HIGH (ref 65–99)
Potassium: 4.2 mmol/L (ref 3.5–5.3)
Sodium: 138 mmol/L (ref 135–146)
Total Bilirubin: 0.3 mg/dL (ref 0.2–1.2)
Total Protein: 6.6 g/dL (ref 6.1–8.1)

## 2019-10-11 NOTE — Progress Notes (Signed)
All labs are normal. 

## 2019-10-31 ENCOUNTER — Other Ambulatory Visit: Payer: Self-pay | Admitting: Family Medicine

## 2019-10-31 DIAGNOSIS — I10 Essential (primary) hypertension: Secondary | ICD-10-CM

## 2019-11-01 MED ORDER — HYDROCHLOROTHIAZIDE 12.5 MG PO CAPS: 12.5000 mg | ORAL_CAPSULE | Freq: Every day | ORAL | 0 refills | Status: DC

## 2019-11-09 MED FILL — HYDROCHLOROTHIAZIDE 12.5 MG: 12.5 | 90 days supply | Qty: 90 | Fill #0

## 2019-11-10 DIAGNOSIS — G4733 Obstructive sleep apnea (adult) (pediatric): Secondary | ICD-10-CM | POA: Diagnosis not present

## 2019-11-17 ENCOUNTER — Ambulatory Visit: Payer: BC Managed Care – PPO | Admitting: Family Medicine

## 2019-11-17 ENCOUNTER — Other Ambulatory Visit: Payer: Self-pay

## 2019-11-17 ENCOUNTER — Encounter: Payer: Self-pay | Admitting: Family Medicine

## 2019-11-17 VITALS — BP 128/70 | HR 92 | Ht 61.0 in

## 2019-11-17 DIAGNOSIS — I10 Essential (primary) hypertension: Secondary | ICD-10-CM

## 2019-11-17 DIAGNOSIS — R600 Localized edema: Secondary | ICD-10-CM | POA: Insufficient documentation

## 2019-11-17 NOTE — Assessment & Plan Note (Signed)
Likely secondary to a combination of high-dose amlodipine as well as venous stasis.  She has noticed significant improvement with the addition of HCTZ 12.5 mg.  Were still can try cutting the amlodipine in half for the next 2 to 3 weeks and she will monitor blood pressures at home to make sure that they are at goal to see if this completely resolves the swelling or not.  Could consider the addition of compression stockings as well.

## 2019-11-17 NOTE — Progress Notes (Signed)
She reports that she hasn't been splitting the Amlodipine 10 mg in half. She stated that she had forgot to do this and when she thought about doing this it was 2 wks into it so she continued to take the 10 mg.    I advised her that the reason that she was to cut the Amlodipine in half was to help with the swelling, and she stated that the swelling in her ankles has improved since the last visit.

## 2019-11-17 NOTE — Progress Notes (Signed)
Established Patient Office Visit  Subjective:  Patient ID: Stacey Castaneda, female    DOB: April 08, 1971  Age: 49 y.o. MRN: [id number]  CC:  Chief Complaint  Patient presents with  . Hypertension    HPI Stacey Castaneda presents for f/u HTN. And LE swelling.  Since she was last here we started her on hydrochlorothiazide 12.5 mg.  She is actually been tolerating it well without a significant side effect she has noticed an increase in urination.  She forgot to actually split the amlodipine so she is actually been taking a whole tab.  Blood pressure actually looks really good.  She does feel like her lower extremity swelling has improved it has not completely resolved but it is better.  Past Medical History:  Diagnosis Date  . Anxiety   . Depression   . Hypertension   . Perianal fistula   . Perirectal abscess 10/2012  . PONV (postoperative nausea and vomiting)   . Poor venous access    01-26-14 "states having PICC line placed on 01-27-14"  . Seasonal allergic rhinitis   . Thyroid goiter    BENIGN- remains "tested negative"  . Vitreous floaters of left eye    evaluated-not always an issue  . Wears contact lenses     Past Surgical History:  Procedure Laterality Date  . ANAL FISTULECTOMY N/A 01/28/2014   Procedure: LIGATION OF INTERNAL FISTULA TRACT, REMOVAL OF SETON ;  Surgeon: Leighton Ruff, MD;  Location: [facility];  Service: General;  Laterality: N/A;  . DILATATION OF CERVIX/  REMOVAL AND REPLACEMENT MIRENA  10-22-2010  . DILATION AND CURETTAGE OF UTERUS  1990's  . EVALUATION UNDER ANESTHESIA WITH ANAL FISTULECTOMY N/A 09/03/2013   Procedure: EXAM UNDER ANESTHESIA ;  Surgeon: Leighton Ruff, MD;  Location: [facility];  Service: General;  Laterality: N/A;  . HYSTEROSCOPY WITH D & C  08-22-2005  . INCISION AND DRAINAGE PERIRECTAL ABSCESS N/A 10/30/2012   Procedure: peri rectal INCISION AND DRAINAGE ABSCESS;  Surgeon: Earnstine Regal, MD;  Location: [facility];  Service: General;  Laterality: N/A;   . PLACEMENT OF SETON N/A 09/03/2013   Procedure: PLACEMENT OF SETON;  Surgeon: Leighton Ruff, MD;  Location: [facility];  Service: General;  Laterality: N/A;  . WISDOM TOOTH EXTRACTION  AGE 73    Family History  Problem Relation Age of Onset  . Diabetes Father   . Heart disease Father   . Hypertension Father   . Diabetes Mother   . Hypertension Mother   . Lung cancer Mother   . Breast cancer Maternal Grandmother     Social History   Socioeconomic History  . Marital status: Married    Spouse name: Not on file  . Number of children: Not on file  . Years of education: Not on file  . Highest education level: Not on file  Occupational History  . Occupation: Database administrator  Tobacco Use  . Smoking status: Former Smoker    Packs/day: 1.50    Years: 10.00    Pack years: 15.00    Types: Cigarettes    Quit date: 10/31/2006    Years since quitting: 13.0  . Smokeless tobacco: Never Used  Vaping Use  . Vaping Use: Never used  Substance and Sexual Activity  . Alcohol use: Yes    Comment: RARE  . Drug use: No  . Sexual activity: Yes    Birth control/protection: I.U.D.  Other Topics Concern  . Not on file  Social History Narrative  .  Not on file   Social Determinants of Health   Financial Resource Strain:   . Difficulty of Paying Living Expenses:   Food Insecurity:   . Worried About Charity fundraiser in the Last Year:   . Arboriculturist in the Last Year:   Transportation Needs:   . Film/video editor (Medical):   Marland Kitchen Lack of Transportation (Non-Medical):   Physical Activity:   . Days of Exercise per Week:   . Minutes of Exercise per Session:   Stress:   . Feeling of Stress :   Social Connections:   . Frequency of Communication with Friends and Family:   . Frequency of Social Gatherings with Friends and Family:   . Attends Religious Services:   . Active Member of Clubs or Organizations:   . Attends Archivist Meetings:   Marland Kitchen Marital Status:   Intimate  Partner Violence:   . Fear of Current or Ex-Partner:   . Emotionally Abused:   Marland Kitchen Physically Abused:   . Sexually Abused:     Outpatient Medications Prior to Visit  Medication Sig Dispense Refill  . albuterol (PROVENTIL HFA;VENTOLIN HFA) 108 (90 Base) MCG/ACT inhaler Inhale 2 puffs into the lungs every 4 (four) hours as needed for wheezing or shortness of breath. 3 Inhaler 2  . AMBULATORY NON FORMULARY MEDICATION Medication Name: Please provide face mask, humidier, and all supplies. DX: [facility]  Fax: [phone] ([facility]) 1 Units PRN  . amLODipine (NORVASC) 10 MG tablet Take 1 tablet (10 mg total) by mouth daily. 90 tablet 1  . cetirizine (ZYRTEC) 10 MG tablet Take 10 mg by mouth every morning.     . docusate sodium (COLACE) 100 MG capsule Take 100 mg by mouth daily.    . fluticasone (FLONASE) 50 MCG/ACT nasal spray Place into both nostrils daily.    . fluticasone furoate-vilanterol (BREO ELLIPTA) 100-25 MCG/INH AEPB Inhale 1 puff into the lungs every morning. 180 each 3  . hydrochlorothiazide (MICROZIDE) 12.5 MG capsule Take 1 capsule (12.5 mg total) by mouth daily. 90 capsule 0  . levonorgestrel (MIRENA) 20 MCG/24HR IUD 1 each by Intrauterine route once.    Marland Kitchen losartan (COZAAR) 100 MG tablet Take 1 tablet (100 mg total) by mouth daily. 90 tablet 1  . Multiple Vitamins-Minerals (MULTIVITAMIN GUMMIES ADULT PO) Take 2 each by mouth every morning.    . sertraline (ZOLOFT) 50 MG tablet TAKE 1 TABLET DAILY 90 tablet 1   No facility-administered medications prior to visit.    Allergies  Allergen Reactions  . Lisinopril Cough    ROS Review of Systems    Objective:    Physical Exam Constitutional:      Appearance: She is well-developed.  HENT:     Head: Normocephalic and atraumatic.  Cardiovascular:     Rate and Rhythm: Normal rate and regular rhythm.     Heart sounds: Normal heart sounds.  Pulmonary:     Effort: Pulmonary effort is normal.     Breath sounds: Normal breath  sounds.  Musculoskeletal:     Comments: Trace edema bilat.  L > R.    Skin:    General: Skin is warm and dry.  Neurological:     Mental Status: She is alert and oriented to person, place, and time.  Psychiatric:        Behavior: Behavior normal.     BP 128/70   Pulse 92   Ht 5\' 1"  (1.549 m)   SpO2 98%  BMI 54.80 kg/m  Wt Readings from Last 3 Encounters:  11/05/18 290 lb (131.5 kg)  03/20/18 267 lb (121.1 kg)  02/03/18 277 lb (125.6 kg)     There are no preventive care reminders to display for this patient.  There are no preventive care reminders to display for this patient.  Lab Results  Component Value Date   TSH 0.92 05/30/2017   Lab Results  Component Value Date   WBC 8.9 01/26/2014   HGB 14.1 01/26/2014   HCT 41.0 01/26/2014   MCV 85.4 01/26/2014   PLT 281 01/26/2014   Lab Results  Component Value Date   NA 138 10/08/2019   K 4.2 10/08/2019   CO2 22 10/08/2019   GLUCOSE 120 (H) 10/08/2019   BUN 12 10/08/2019   CREATININE 0.69 10/08/2019   BILITOT 0.3 10/08/2019   ALKPHOS 70 05/21/2016   AST 16 10/08/2019   ALT 18 10/08/2019   PROT 6.6 10/08/2019   ALBUMIN 3.9 05/21/2016   CALCIUM 8.7 10/08/2019   ANIONGAP 14 01/26/2014   Lab Results  Component Value Date   CHOL 169 05/30/2017   Lab Results  Component Value Date   HDL 54 05/30/2017   Lab Results  Component Value Date   LDLCALC 102 (H) 05/30/2017   Lab Results  Component Value Date   TRIG 44 05/30/2017   Lab Results  Component Value Date   CHOLHDL 3.1 05/30/2017   Lab Results  Component Value Date   HGBA1C 4.9 05/21/2016      Assessment & Plan:   Problem List Items Addressed This Visit      Cardiovascular and Mediastinum   Essential hypertension - Primary    Blood pressure looks phenomenal today.  We discussed options including just continuing the current regimen with a 10 mg amlodipine and keeping the 12.5 mg HCTZ combination.  She would really like to still try to split  the amlodipine in half to see if the swelling completely resolves.  As long as her blood pressures are staying under 130 and at goal then this is reasonable.  If at any point she is seeing blood pressures greater than this then we may need to go back to the whole tab of amlodipine.  Plan to follow-up in 6 weeks.      Relevant Orders   BASIC METABOLIC PANEL WITH GFR   Lipid panel     Other   Bilateral lower extremity edema    Likely secondary to a combination of high-dose amlodipine as well as venous stasis.  She has noticed significant improvement with the addition of HCTZ 12.5 mg.  Were still can try cutting the amlodipine in half for the next 2 to 3 weeks and she will monitor blood pressures at home to make sure that they are at goal to see if this completely resolves the swelling or not.  Could consider the addition of compression stockings as well.         No orders of the defined types were placed in this encounter.   Follow-up: Return in about 6 weeks (around 12/29/2019) for Hypertension.   Time spent in encounter 21 minutes.  Beatrice Lecher, MD

## 2019-11-17 NOTE — Assessment & Plan Note (Signed)
Blood pressure looks phenomenal today.  We discussed options including just continuing the current regimen with a 10 mg amlodipine and keeping the 12.5 mg HCTZ combination.  She would really like to still try to split the amlodipine in half to see if the swelling completely resolves.  As long as her blood pressures are staying under 130 and at goal then this is reasonable.  If at any point she is seeing blood pressures greater than this then we may need to go back to the whole tab of amlodipine.  Plan to follow-up in 6 weeks.

## 2019-11-18 LAB — BASIC METABOLIC PANEL WITH GFR
BUN: 9 mg/dL (ref 7–25)
CO2: 28 mmol/L (ref 20–32)
Calcium: 9 mg/dL (ref 8.6–10.2)
Chloride: 102 mmol/L (ref 98–110)
Creat: 0.66 mg/dL (ref 0.50–1.10)
GFR, Est African American: 120 mL/min/{1.73_m2} (ref >=60)
GFR, Est Non African American: 104 mL/min/{1.73_m2} (ref >=60)
Glucose, Bld: 95 mg/dL (ref 65–99)
Potassium: 4.1 mmol/L (ref 3.5–5.3)
Sodium: 138 mmol/L (ref 135–146)

## 2019-11-18 LAB — LIPID PANEL
Cholesterol: 180 mg/dL (ref <200)
HDL: 55 mg/dL (ref >=50)
LDL Cholesterol (Calc): 106 mg/dL (calc) — ABNORMAL HIGH
Non-HDL Cholesterol (Calc): 125 mg/dL (calc) (ref <130)
Total CHOL/HDL Ratio: 3.3 (calc) (ref <5.0)
Triglycerides: 93 mg/dL (ref <150)

## 2019-11-22 ENCOUNTER — Encounter: Payer: Self-pay | Admitting: Family Medicine

## 2019-11-23 MED ORDER — AMLODIPINE BESYLATE 5 MG PO TABS: 5.0000 mg | ORAL_TABLET | Freq: Every day | ORAL | 0 refills | Status: DC

## 2019-11-23 MED FILL — AMLODIPINE BESYLATE 5 MG TA: 5 | 30 days supply | Qty: 30 | Fill #0

## 2019-11-23 NOTE — Telephone Encounter (Signed)
Med sent.

## 2019-12-22 ENCOUNTER — Other Ambulatory Visit (HOSPITAL_BASED_OUTPATIENT_CLINIC_OR_DEPARTMENT_OTHER): Payer: Self-pay | Admitting: Family Medicine

## 2019-12-22 ENCOUNTER — Other Ambulatory Visit: Payer: Self-pay | Admitting: Family Medicine

## 2019-12-22 MED FILL — AMLODIPINE BESYLATE 5 MG TA: 5 | 90 days supply | Qty: 90 | Fill #0

## 2019-12-29 ENCOUNTER — Other Ambulatory Visit (HOSPITAL_BASED_OUTPATIENT_CLINIC_OR_DEPARTMENT_OTHER): Payer: Self-pay | Admitting: Family Medicine

## 2019-12-29 ENCOUNTER — Encounter: Payer: Self-pay | Admitting: Family Medicine

## 2019-12-29 ENCOUNTER — Ambulatory Visit (INDEPENDENT_AMBULATORY_CARE_PROVIDER_SITE_OTHER): Payer: BC Managed Care – PPO | Admitting: Family Medicine

## 2019-12-29 VITALS — BP 132/70 | HR 102

## 2019-12-29 DIAGNOSIS — M25561 Pain in right knee: Secondary | ICD-10-CM

## 2019-12-29 DIAGNOSIS — M2241 Chondromalacia patellae, right knee: Secondary | ICD-10-CM

## 2019-12-29 DIAGNOSIS — R600 Localized edema: Secondary | ICD-10-CM

## 2019-12-29 DIAGNOSIS — I10 Essential (primary) hypertension: Secondary | ICD-10-CM | POA: Diagnosis not present

## 2019-12-29 DIAGNOSIS — M545 Low back pain, unspecified: Secondary | ICD-10-CM

## 2019-12-29 MED ORDER — BREO ELLIPTA 100-25 MCG/INH IN AEPB: 1.0000 | INHALATION_SPRAY | RESPIRATORY_TRACT | 3 refills | Status: DC

## 2019-12-29 MED ORDER — ALBUTEROL SULFATE HFA 108 (90 BASE) MCG/ACT IN AERS: 2.0000 | INHALATION_SPRAY | RESPIRATORY_TRACT | 4 refills | Status: DC | PRN

## 2019-12-29 MED ORDER — SERTRALINE HCL 50 MG PO TABS: 50.0000 mg | ORAL_TABLET | Freq: Every day | ORAL | 1 refills | Status: DC

## 2019-12-29 MED ORDER — HYDROCHLOROTHIAZIDE 12.5 MG PO CAPS: 12.5000 mg | ORAL_CAPSULE | Freq: Every day | ORAL | 0 refills | Status: DC

## 2019-12-29 MED FILL — BREO ELLIPTA 100-25 MCG INH: 100-25 | 90 days supply | Qty: 180 | Fill #0

## 2019-12-29 MED FILL — SERTRALINE HCL 50 MG TABLET: 50 | 90 days supply | Qty: 90 | Fill #0

## 2019-12-29 MED FILL — ALBUTEROL SULFATE HFA 108 (: 108 (90 BAS | 50 days supply | Qty: 54 | Fill #0

## 2019-12-29 NOTE — Progress Notes (Signed)
Established Patient Office Visit  Subjective:  Patient ID: Stacey Castaneda, female    DOB: 28-Dec-1970  Age: 49 y.o. MRN: [id number]  CC:  Chief Complaint  Patient presents with  . Hypertension    HPI Stacey Castaneda presents for HTN f/u and lower extremity edema.  When I last saw her in July she was taking 10 mg of amlodipine and 12.5 hydrochlorothiazide that we had actually just recently added.  Her blood pressure looks great at the time she was still experiencing some lower extremity swelling but it was improved with the addition of the diuretic.  We discussed trying to decrease the amlodipine back down to 5 to again see if it helped with the swelling she has done that since July and has been happy with that regimen so in fact we actually just refilled her prescription for 5 mg amlodipine a couple of days ago.  She has been under a lot of stress recently as her husband was unfortunately admitted for a foot infection he underwent a partial foot amputation and then unfortunately the tissue was still not viable and so they had to do a below the knee amputation.  He is recovering but this does require a lot of care from her and has been very stressful.  She is actually been experiencing some back pain and right knee pain especially with having to help her husband she is having to squat more and bend over more and help move him and lift him some.  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  Past Medical History:  Diagnosis Date  . Anxiety   . Depression   . Hypertension   . Perianal fistula   . Perirectal abscess 10/2012  . PONV (postoperative nausea and vomiting)   . Poor venous access    01-26-14 "states having PICC line placed on 01-27-14"  . Seasonal allergic rhinitis   . Thyroid goiter    BENIGN- remains "tested negative"  . Vitreous floaters of left eye    evaluated-not always an issue  . Wears contact lenses     Past  Surgical History:  Procedure Laterality Date  . ANAL FISTULECTOMY N/A 01/28/2014   Procedure: LIGATION OF INTERNAL FISTULA TRACT, REMOVAL OF SETON ;  Surgeon: Leighton Ruff, MD;  Location: [facility];  Service: General;  Laterality: N/A;  . DILATATION OF CERVIX/  REMOVAL AND REPLACEMENT MIRENA  10-22-2010  . DILATION AND CURETTAGE OF UTERUS  1990's  . EVALUATION UNDER ANESTHESIA WITH ANAL FISTULECTOMY N/A 09/03/2013   Procedure: EXAM UNDER ANESTHESIA ;  Surgeon: Leighton Ruff, MD;  Location: [facility];  Service: General;  Laterality: N/A;  . HYSTEROSCOPY WITH D & C  08-22-2005  . INCISION AND DRAINAGE PERIRECTAL ABSCESS N/A 10/30/2012   Procedure: peri rectal INCISION AND DRAINAGE ABSCESS;  Surgeon: Earnstine Regal, MD;  Location: [facility];  Service: General;  Laterality: N/A;  . PLACEMENT OF SETON N/A 09/03/2013   Procedure: PLACEMENT OF SETON;  Surgeon: Leighton Ruff, MD;  Location: [facility];  Service: General;  Laterality: N/A;  . WISDOM TOOTH EXTRACTION  AGE 72    Family History  Problem Relation Age of Onset  . Diabetes Father   . Heart disease Father   . Hypertension Father   . Diabetes Mother   . Hypertension Mother   . Lung cancer Mother   . Breast cancer Maternal Grandmother     Social History   Socioeconomic History  .  Marital status: Married    Spouse name: Not on file  . Number of children: Not on file  . Years of education: Not on file  . Highest education level: Not on file  Occupational History  . Occupation: Database administrator  Tobacco Use  . Smoking status: Former Smoker    Packs/day: 1.50    Years: 10.00    Pack years: 15.00    Types: Cigarettes    Quit date: 10/31/2006    Years since quitting: 13.1  . Smokeless tobacco: Never Used  Vaping Use  . Vaping Use: Never used  Substance and Sexual Activity  . Alcohol use: Yes    Comment: RARE  . Drug use: No  . Sexual activity: Yes    Birth control/protection: I.U.D.  Other Topics Concern  . Not on file  Social History  Narrative  . Not on file   Social Determinants of Health   Financial Resource Strain:   . Difficulty of Paying Living Expenses: Not on file  Food Insecurity:   . Worried About Charity fundraiser in the Last Year: Not on file  . Ran Out of Food in the Last Year: Not on file  Transportation Needs:   . Lack of Transportation (Medical): Not on file  . Lack of Transportation (Non-Medical): Not on file  Physical Activity:   . Days of Exercise per Week: Not on file  . Minutes of Exercise per Session: Not on file  Stress:   . Feeling of Stress : Not on file  Social Connections:   . Frequency of Communication with Friends and Family: Not on file  . Frequency of Social Gatherings with Friends and Family: Not on file  . Attends Religious Services: Not on file  . Active Member of Clubs or Organizations: Not on file  . Attends Archivist Meetings: Not on file  . Marital Status: Not on file  Intimate Partner Violence:   . Fear of Current or Ex-Partner: Not on file  . Emotionally Abused: Not on file  . Physically Abused: Not on file  . Sexually Abused: Not on file    Outpatient Medications Prior to Visit  Medication Sig Dispense Refill  . AMBULATORY NON FORMULARY MEDICATION Medication Name: Please provide face mask, humidier, and all supplies. DX: [facility]  Fax: [phone] ([facility]) 1 Units PRN  . amLODipine (NORVASC) 5 MG tablet TAKE 1 TABLET (5 MG TOTAL) BY MOUTH DAILY. 90 tablet 1  . cetirizine (ZYRTEC) 10 MG tablet Take 10 mg by mouth every morning.     . docusate sodium (COLACE) 100 MG capsule Take 100 mg by mouth daily.    . fluticasone (FLONASE) 50 MCG/ACT nasal spray Place into both nostrils daily.    Marland Kitchen levonorgestrel (MIRENA) 20 MCG/24HR IUD 1 each by Intrauterine route once.    . Multiple Vitamins-Minerals (MULTIVITAMIN GUMMIES ADULT PO) Take 2 each by mouth every morning.    Marland Kitchen albuterol (PROVENTIL HFA;VENTOLIN HFA) 108 (90 Base) MCG/ACT inhaler Inhale 2 puffs into the  lungs every 4 (four) hours as needed for wheezing or shortness of breath. 3 Inhaler 2  . fluticasone furoate-vilanterol (BREO ELLIPTA) 100-25 MCG/INH AEPB Inhale 1 puff into the lungs every morning. 180 each 3  . hydrochlorothiazide (MICROZIDE) 12.5 MG capsule Take 1 capsule (12.5 mg total) by mouth daily. 90 capsule 0  . sertraline (ZOLOFT) 50 MG tablet TAKE 1 TABLET DAILY 90 tablet 1  . losartan (COZAAR) 100 MG tablet Take 1 tablet (100 mg total)  by mouth daily. 90 tablet 1   No facility-administered medications prior to visit.    Allergies  Allergen Reactions  . Lisinopril Cough    ROS Review of Systems    Objective:    Physical Exam Constitutional:      Appearance: She is well-developed.  HENT:     Head: Normocephalic and atraumatic.  Cardiovascular:     Rate and Rhythm: Normal rate and regular rhythm.     Heart sounds: Normal heart sounds.  Pulmonary:     Effort: Pulmonary effort is normal.     Breath sounds: Normal breath sounds.  Musculoskeletal:     Comments: Trace pitting edema around both ankles.  Right knee with also some trace swelling.  Nontender along the medial joint lines.  Normal flexion and extension.  Negative McMurray's.  No significant crepitus on exam.  Skin:    General: Skin is warm and dry.  Neurological:     Mental Status: She is alert and oriented to person, place, and time.  Psychiatric:        Behavior: Behavior normal.     BP 132/70   Pulse (!) 102   SpO2 98%  Wt Readings from Last 3 Encounters:  11/05/18 290 lb (131.5 kg)  03/20/18 267 lb (121.1 kg)  02/03/18 277 lb (125.6 kg)     Health Maintenance Due  Topic Date Due  . INFLUENZA VACCINE  12/05/2019    There are no preventive care reminders to display for this patient.  Lab Results  Component Value Date   TSH 0.92 05/30/2017   Lab Results  Component Value Date   WBC 8.9 01/26/2014   HGB 14.1 01/26/2014   HCT 41.0 01/26/2014   MCV 85.4 01/26/2014   PLT 281 01/26/2014    Lab Results  Component Value Date   NA 138 11/17/2019   K 4.1 11/17/2019   CO2 28 11/17/2019   GLUCOSE 95 11/17/2019   BUN 9 11/17/2019   CREATININE 0.66 11/17/2019   BILITOT 0.3 10/08/2019   ALKPHOS 70 05/21/2016   AST 16 10/08/2019   ALT 18 10/08/2019   PROT 6.6 10/08/2019   ALBUMIN 3.9 05/21/2016   CALCIUM 9.0 11/17/2019   ANIONGAP 14 01/26/2014   Lab Results  Component Value Date   CHOL 180 11/17/2019   Lab Results  Component Value Date   HDL 55 11/17/2019   Lab Results  Component Value Date   LDLCALC 106 (H) 11/17/2019   Lab Results  Component Value Date   TRIG 93 11/17/2019   Lab Results  Component Value Date   CHOLHDL 3.3 11/17/2019   Lab Results  Component Value Date   HGBA1C 4.9 05/21/2016      Assessment & Plan:   Problem List Items Addressed This Visit      Cardiovascular and Mediastinum   Essential hypertension - Primary    Blood pressure actually looks pretty good today would like to see that systolic less than 294 but right now doing well on the combination of amlodipine 5 mg and hydrochlorothiazide.  Unfortunately this does not come in a combination.  Plan to follow-up in about 3 to 4 months.      Relevant Medications   hydrochlorothiazide (MICROZIDE) 12.5 MG capsule     Musculoskeletal and Integument   Chondromalacia of right patellofemoral joint     Other   Bilateral lower extremity edema   Acute pain of right knee    Continue conservative care with PRICE therapy.  In  addition given exercises for patellofemoral syndrome.  Commend work on strengthening the muscles around the knee to decrease pain and popping.  If not improving over the next few weeks we will try to get her in with Dr. Dianah Field for further evaluation.       Acute midline low back pain    You okay to use heat ice and Tylenol and/or Motrin as needed.  Avoid heavy lifting as much as possible.  If it gets worse we can always consider formal physical therapy if  needed.         Meds ordered this encounter  Medications  . fluticasone furoate-vilanterol (BREO ELLIPTA) 100-25 MCG/INH AEPB    Sig: Inhale 1 puff into the lungs every morning.    Dispense:  180 each    Refill:  3  . albuterol (VENTOLIN HFA) 108 (90 Base) MCG/ACT inhaler    Sig: Inhale 2 puffs into the lungs every 4 (four) hours as needed for wheezing or shortness of breath.    Dispense:  3 each    Refill:  4  . hydrochlorothiazide (MICROZIDE) 12.5 MG capsule    Sig: Take 1 capsule (12.5 mg total) by mouth daily.    Dispense:  90 capsule    Refill:  0  . sertraline (ZOLOFT) 50 MG tablet    Sig: Take 1 tablet (50 mg total) by mouth daily.    Dispense:  90 tablet    Refill:  1    Follow-up: Return in about 3 months (around 03/30/2020) for bp.    Beatrice Lecher, MD

## 2019-12-31 ENCOUNTER — Ambulatory Visit: Payer: BC Managed Care – PPO | Admitting: Family Medicine

## 2019-12-31 ENCOUNTER — Encounter: Payer: Self-pay | Admitting: Family Medicine

## 2019-12-31 DIAGNOSIS — M25561 Pain in right knee: Secondary | ICD-10-CM | POA: Insufficient documentation

## 2019-12-31 DIAGNOSIS — M545 Low back pain, unspecified: Secondary | ICD-10-CM | POA: Insufficient documentation

## 2019-12-31 NOTE — Assessment & Plan Note (Signed)
You okay to use heat ice and Tylenol and/or Motrin as needed.  Avoid heavy lifting as much as possible.  If it gets worse we can always consider formal physical therapy if needed.

## 2019-12-31 NOTE — Assessment & Plan Note (Signed)
Continue conservative care with PRICE therapy.  In addition given exercises for patellofemoral syndrome.  Commend work on strengthening the muscles around the knee to decrease pain and popping.  If not improving over the next few weeks we will try to get her in with Dr. Dianah Field for further evaluation.

## 2019-12-31 NOTE — Assessment & Plan Note (Signed)
Blood pressure actually looks pretty good today would like to see that systolic less than 200 but right now doing well on the combination of amlodipine 5 mg and hydrochlorothiazide.  Unfortunately this does not come in a combination.  Plan to follow-up in about 3 to 4 months.

## 2020-01-03 ENCOUNTER — Other Ambulatory Visit: Payer: Self-pay | Admitting: Family Medicine

## 2020-01-03 MED FILL — LOSARTAN POTASSIUM 100 MG T: 100 | 30 days supply | Qty: 30 | Fill #1

## 2020-01-17 ENCOUNTER — Ambulatory Visit: Payer: BC Managed Care – PPO | Admitting: Obstetrics & Gynecology

## 2020-01-24 ENCOUNTER — Other Ambulatory Visit: Payer: Self-pay | Admitting: Obstetrics & Gynecology

## 2020-01-24 ENCOUNTER — Encounter: Payer: Self-pay | Admitting: Obstetrics & Gynecology

## 2020-01-24 ENCOUNTER — Other Ambulatory Visit: Payer: Self-pay

## 2020-01-24 ENCOUNTER — Ambulatory Visit (INDEPENDENT_AMBULATORY_CARE_PROVIDER_SITE_OTHER): Payer: BC Managed Care – PPO | Admitting: Obstetrics & Gynecology

## 2020-01-24 VITALS — BP 156/87 | HR 83 | Ht 64.0 in

## 2020-01-24 DIAGNOSIS — T8332XD Displacement of intrauterine contraceptive device, subsequent encounter: Secondary | ICD-10-CM | POA: Diagnosis not present

## 2020-01-24 DIAGNOSIS — Z1231 Encounter for screening mammogram for malignant neoplasm of breast: Secondary | ICD-10-CM

## 2020-01-24 DIAGNOSIS — Z01419 Encounter for gynecological examination (general) (routine) without abnormal findings: Secondary | ICD-10-CM

## 2020-01-24 DIAGNOSIS — N921 Excessive and frequent menstruation with irregular cycle: Secondary | ICD-10-CM

## 2020-01-24 NOTE — Progress Notes (Signed)
h Subjective:     Stacey Castaneda is a 49 y.o. female here for a routine exam.  Current complaints: Patient bleeding with IUD in place.  We do note that it is malpositioned because during insertion she was unable to tolerate the rest of the procedure and closed her legs.  I believe that it is not at the fundus.  She would like to have the IUD removed and replaced while in the operating room.  She is undergoing a lot of family stress.  Her husband had an amputation of 1 leg below the knee.  He also has had several falls.  She did like the prescheduled in about a month.  She had done very well with the last 2 IUDs.   Gynecologic History No LMP recorded. (Menstrual status: IUD). Contraception: IUD Last Pap: 12/2018. Results were: normal Last mammogram: 2018. Results were: normal  Obstetric History OB History  Gravida Para Term Preterm AB Living  0            SAB TAB Ectopic Multiple Live Births                The following portions of the patient's history were reviewed and updated as appropriate: allergies, current medications, past family history, past medical history, past social history, past surgical history and problem list.  Review of Systems Pertinent items noted in HPI and remainder of comprehensive ROS otherwise negative.    Objective:      Vitals:   01/24/20 1305  BP: (!) 156/87  Pulse: 83  Height: 5\' 4"  (1.626 m)   Vitals:  WNL General appearance: alert, cooperative and no distress  HEENT: Normocephalic, without obvious abnormality, atraumatic Eyes: negative Throat: lips, mucosa, and tongue normal; teeth and gums normal  Respiratory: Normal effort  CV: Regular rate and rhythm  Breasts:  Normal appearance, no masses or tenderness, no nipple retraction or dimpling  GI: Soft, non-tender; bowel sounds normal; no masses,  no organomegaly  GU: External Genitalia:  Tanner V, no lesion Urethra:  No prolapse   Vagina: Pink, normal rugae, no blood or discharge  Cervix:  No CMT, no lesion, IUD strings seen, small amount of blood at os  Uterus:  Limited by habitus  Adnexa: Limited by habitus  Musculoskeletal: No edema, redness or tenderness in the calves or thighs  Skin: No lesions or rash  Lymphatic: Axillary adenopathy: none     Psychiatric: Normal mood and behavior        Assessment:    Healthy female exam.   Menorrhagia with malpositioned IUD   Plan:  Mammogram Up to date on Pap Schedule endometrial biopsy and IUD removal and insertion in the OR (pt desires procedure in 1 month_

## 2020-02-02 ENCOUNTER — Other Ambulatory Visit: Payer: Self-pay

## 2020-02-02 ENCOUNTER — Ambulatory Visit (INDEPENDENT_AMBULATORY_CARE_PROVIDER_SITE_OTHER): Payer: BC Managed Care – PPO

## 2020-02-02 DIAGNOSIS — Z1231 Encounter for screening mammogram for malignant neoplasm of breast: Secondary | ICD-10-CM

## 2020-02-02 MED FILL — HYDROCHLOROTHIAZIDE 12.5 MG: 12.5 | 90 days supply | Qty: 90 | Fill #0

## 2020-02-02 MED FILL — LOSARTAN POTASSIUM 100 MG T: 100 | 30 days supply | Qty: 30 | Fill #1

## 2020-02-21 ENCOUNTER — Encounter: Payer: Self-pay | Admitting: Family Medicine

## 2020-02-22 ENCOUNTER — Other Ambulatory Visit (HOSPITAL_BASED_OUTPATIENT_CLINIC_OR_DEPARTMENT_OTHER): Payer: Self-pay | Admitting: Family Medicine

## 2020-02-22 ENCOUNTER — Other Ambulatory Visit: Payer: Self-pay | Admitting: *Deleted

## 2020-02-22 MED ORDER — LOSARTAN POTASSIUM 100 MG PO TABS: 100.0000 mg | ORAL_TABLET | Freq: Every day | ORAL | 0 refills | Status: DC

## 2020-02-29 MED FILL — LOSARTAN POTASSIUM 100 MG T: 100 | 90 days supply | Qty: 90 | Fill #0

## 2020-03-03 ENCOUNTER — Ambulatory Visit (INDEPENDENT_AMBULATORY_CARE_PROVIDER_SITE_OTHER): Payer: BC Managed Care – PPO | Admitting: Family Medicine

## 2020-03-03 DIAGNOSIS — Z23 Encounter for immunization: Secondary | ICD-10-CM

## 2020-03-03 NOTE — Progress Notes (Signed)
Pt here for flu shot. Afebrile,no recent illness. Vaccination given, pt tolerated well.  

## 2020-03-24 ENCOUNTER — Other Ambulatory Visit: Payer: Self-pay

## 2020-03-24 ENCOUNTER — Encounter: Payer: Self-pay | Admitting: Family Medicine

## 2020-03-24 ENCOUNTER — Ambulatory Visit (INDEPENDENT_AMBULATORY_CARE_PROVIDER_SITE_OTHER): Payer: BC Managed Care – PPO | Admitting: Family Medicine

## 2020-03-24 ENCOUNTER — Other Ambulatory Visit (HOSPITAL_COMMUNITY): Payer: Self-pay | Admitting: Family Medicine

## 2020-03-24 VITALS — BP 136/76 | HR 98

## 2020-03-24 DIAGNOSIS — I10 Essential (primary) hypertension: Secondary | ICD-10-CM | POA: Diagnosis not present

## 2020-03-24 DIAGNOSIS — M25531 Pain in right wrist: Secondary | ICD-10-CM | POA: Diagnosis not present

## 2020-03-24 MED ORDER — LOSARTAN POTASSIUM-HCTZ 100-12.5 MG PO TABS: 1.0000 | ORAL_TABLET | Freq: Every day | ORAL | 1 refills | Status: DC

## 2020-03-24 MED FILL — LOSARTAN-HCTZ 100-12.5 MG T: 100-12.5 | 90 days supply | Qty: 90 | Fill #0

## 2020-03-24 NOTE — Progress Notes (Signed)
Established Patient Office Visit  Subjective:  Patient ID: Stacey Castaneda, female    DOB: Sep 29, 1970  Age: 49 y.o. MRN: [id number]  CC:  Chief Complaint  Patient presents with  . Wrist Pain  . Hypertension    HPI Stacey Castaneda presents for   Hypertension- Pt denies chest pain, SOB, dizziness, or heart palpitations.  Taking meds as directed w/o problems.  Denies medication side effects.  Is interested in trying to combine her medications if possible she also got a letter from her insurance company saying that some of her medications are no longer going to be free.  Right wrist pain -she reports that she injured her wrist several months ago pushing her husband's wheelchair.  It was sore and tender.  But more recently maybe about 3 weeks ago she was trying to lift the wheelchair out of the car and her wrist twisted outward.  It is actually been more sore and tender since then.  She denies any significant bruising.  She did have a little bit of swelling.  Over the last several days she has been wearing a cock-up wrist splint and says she does feel like it is been helpful.  Past Medical History:  Diagnosis Date  . Anxiety   . Depression   . Hypertension   . Perianal fistula   . Perirectal abscess 10/2012  . PONV (postoperative nausea and vomiting)   . Poor venous access    01-26-14 "states having PICC line placed on 01-27-14"  . Seasonal allergic rhinitis   . Thyroid goiter    BENIGN- remains "tested negative"  . Vitreous floaters of left eye    evaluated-not always an issue  . Wears contact lenses     Past Surgical History:  Procedure Laterality Date  . ANAL FISTULECTOMY N/A 01/28/2014   Procedure: LIGATION OF INTERNAL FISTULA TRACT, REMOVAL OF SETON ;  Surgeon: Leighton Ruff, MD;  Location: [facility];  Service: General;  Laterality: N/A;  . DILATATION OF CERVIX/  REMOVAL AND REPLACEMENT MIRENA  10-22-2010  . DILATION AND CURETTAGE OF UTERUS  1990's  . EVALUATION UNDER  ANESTHESIA WITH ANAL FISTULECTOMY N/A 09/03/2013   Procedure: EXAM UNDER ANESTHESIA ;  Surgeon: Leighton Ruff, MD;  Location: [facility];  Service: General;  Laterality: N/A;  . HYSTEROSCOPY WITH D & C  08-22-2005  . INCISION AND DRAINAGE PERIRECTAL ABSCESS N/A 10/30/2012   Procedure: peri rectal INCISION AND DRAINAGE ABSCESS;  Surgeon: Earnstine Regal, MD;  Location: [facility];  Service: General;  Laterality: N/A;  . PLACEMENT OF SETON N/A 09/03/2013   Procedure: PLACEMENT OF SETON;  Surgeon: Leighton Ruff, MD;  Location: [facility];  Service: General;  Laterality: N/A;  . WISDOM TOOTH EXTRACTION  AGE 59    Family History  Problem Relation Age of Onset  . Diabetes Father   . Heart disease Father   . Hypertension Father   . Diabetes Mother   . Hypertension Mother   . Lung cancer Mother   . Breast cancer Maternal Grandmother     Social History   Socioeconomic History  . Marital status: Married    Spouse name: Not on file  . Number of children: Not on file  . Years of education: Not on file  . Highest education level: Not on file  Occupational History  . Occupation: Database administrator  Tobacco Use  . Smoking status: Former Smoker    Packs/day: 1.50    Years: 10.00    Pack years: 15.00  Types: Cigarettes    Quit date: 10/31/2006    Years since quitting: 13.4  . Smokeless tobacco: Never Used  Vaping Use  . Vaping Use: Never used  Substance and Sexual Activity  . Alcohol use: Yes    Comment: RARE  . Drug use: No  . Sexual activity: Yes    Birth control/protection: I.U.D.  Other Topics Concern  . Not on file  Social History Narrative  . Not on file   Social Determinants of Health   Financial Resource Strain:   . Difficulty of Paying Living Expenses: Not on file  Food Insecurity:   . Worried About Charity fundraiser in the Last Year: Not on file  . Ran Out of Food in the Last Year: Not on file  Transportation Needs:   . Lack of Transportation (Medical): Not on file  . Lack of  Transportation (Non-Medical): Not on file  Physical Activity:   . Days of Exercise per Week: Not on file  . Minutes of Exercise per Session: Not on file  Stress:   . Feeling of Stress : Not on file  Social Connections:   . Frequency of Communication with Friends and Family: Not on file  . Frequency of Social Gatherings with Friends and Family: Not on file  . Attends Religious Services: Not on file  . Active Member of Clubs or Organizations: Not on file  . Attends Archivist Meetings: Not on file  . Marital Status: Not on file  Intimate Partner Violence:   . Fear of Current or Ex-Partner: Not on file  . Emotionally Abused: Not on file  . Physically Abused: Not on file  . Sexually Abused: Not on file    Outpatient Medications Prior to Visit  Medication Sig Dispense Refill  . albuterol (VENTOLIN HFA) 108 (90 Base) MCG/ACT inhaler Inhale 2 puffs into the lungs every 4 (four) hours as needed for wheezing or shortness of breath. 3 each 4  . AMBULATORY NON FORMULARY MEDICATION Medication Name: Please provide face mask, humidier, and all supplies. DX: [facility]  Fax: [phone] ([facility]) 1 Units PRN  . amLODipine (NORVASC) 5 MG tablet TAKE 1 TABLET (5 MG TOTAL) BY MOUTH DAILY. 90 tablet 1  . cetirizine (ZYRTEC) 10 MG tablet Take 10 mg by mouth every morning.     . docusate sodium (COLACE) 100 MG capsule Take 100 mg by mouth daily.    . fluticasone (FLONASE) 50 MCG/ACT nasal spray Place into both nostrils daily.    . fluticasone furoate-vilanterol (BREO ELLIPTA) 100-25 MCG/INH AEPB Inhale 1 puff into the lungs every morning. 180 each 3  . levonorgestrel (MIRENA) 20 MCG/24HR IUD 1 each by Intrauterine route once.    . Multiple Vitamins-Minerals (MULTIVITAMIN GUMMIES ADULT PO) Take 2 each by mouth every morning.    . sertraline (ZOLOFT) 50 MG tablet Take 1 tablet (50 mg total) by mouth daily. 90 tablet 1  . hydrochlorothiazide (MICROZIDE) 12.5 MG capsule Take 1 capsule (12.5 mg total)  by mouth daily. 90 capsule 0  . losartan (COZAAR) 100 MG tablet Take 1 tablet (100 mg total) by mouth daily. 90 tablet 0   No facility-administered medications prior to visit.    Allergies  Allergen Reactions  . Lisinopril Cough    ROS Review of Systems    Objective:    Physical Exam Constitutional:      Appearance: She is well-developed.  HENT:     Head: Normocephalic and atraumatic.  Cardiovascular:  Rate and Rhythm: Normal rate and regular rhythm.     Heart sounds: Normal heart sounds.  Pulmonary:     Effort: Pulmonary effort is normal.     Breath sounds: Normal breath sounds.  Musculoskeletal:     Comments: Right wrist is nontender normal range of motion strength is 5-5 in all directions.  Tender over the elbow.  Skin:    General: Skin is warm and dry.  Neurological:     Mental Status: She is alert and oriented to person, place, and time.  Psychiatric:        Behavior: Behavior normal.     BP 136/76   Pulse 98   SpO2 99%  Wt Readings from Last 3 Encounters:  11/05/18 290 lb (131.5 kg)  03/20/18 267 lb (121.1 kg)  02/03/18 277 lb (125.6 kg)     There are no preventive care reminders to display for this patient.  There are no preventive care reminders to display for this patient.  Lab Results  Component Value Date   TSH 0.92 05/30/2017   Lab Results  Component Value Date   WBC 8.9 01/26/2014   HGB 14.1 01/26/2014   HCT 41.0 01/26/2014   MCV 85.4 01/26/2014   PLT 281 01/26/2014   Lab Results  Component Value Date   NA 138 11/17/2019   K 4.1 11/17/2019   CO2 28 11/17/2019   GLUCOSE 95 11/17/2019   BUN 9 11/17/2019   CREATININE 0.66 11/17/2019   BILITOT 0.3 10/08/2019   ALKPHOS 70 05/21/2016   AST 16 10/08/2019   ALT 18 10/08/2019   PROT 6.6 10/08/2019   ALBUMIN 3.9 05/21/2016   CALCIUM 9.0 11/17/2019   ANIONGAP 14 01/26/2014   Lab Results  Component Value Date   CHOL 180 11/17/2019   Lab Results  Component Value Date   HDL 55  11/17/2019   Lab Results  Component Value Date   LDLCALC 106 (H) 11/17/2019   Lab Results  Component Value Date   TRIG 93 11/17/2019   Lab Results  Component Value Date   CHOLHDL 3.3 11/17/2019   Lab Results  Component Value Date   HGBA1C 4.9 05/21/2016      Assessment & Plan:   Problem List Items Addressed This Visit      Cardiovascular and Mediastinum   Essential hypertension - Primary    Pressure still just a little borderline elevated today.  We will go ahead and combine her losartan HCT.  Will monitor.  If blood pressure still elevated when I see her back consider switching to valsartan HCT.      Relevant Medications   losartan-hydrochlorothiazide (HYZAAR) 100-12.5 MG tablet    Other Visit Diagnoses    Right wrist pain         Wrist pain-suspect a tendinitis injury/sprain.  Recommend continuing with the splint for support and comfort and then starting to work on some stretching exercises using a soup can and resting her arm on the table and just isolating her wrist movements.  Okay to use anti-inflammatory as needed.  If not improving over the next couple of weeks then please let us know.  I do not have a high suspicion for fracture so did not recommend x-ray today.  But could consider going forward if her pain persists.  Meds ordered this encounter  Medications  . losartan-hydrochlorothiazide (HYZAAR) 100-12.5 MG tablet    Sig: Take 1 tablet by mouth daily.    Dispense:  90 tablet  Refill:  1    Follow-up: Return in about 4 months (around 07/22/2020) for Hypertension.    Beatrice Lecher, MD

## 2020-03-24 NOTE — Assessment & Plan Note (Addendum)
Pressure still just a little borderline elevated today.  We will go ahead and combine her losartan HCT.  Will monitor.  If blood pressure still elevated when I see her back consider switching to valsartan HCT.

## 2020-03-27 MED FILL — AMLODIPINE BESYLATE 5 MG TA: 5 | 90 days supply | Qty: 90 | Fill #1

## 2020-04-13 ENCOUNTER — Other Ambulatory Visit: Payer: Self-pay | Admitting: Family Medicine

## 2020-04-13 ENCOUNTER — Other Ambulatory Visit (HOSPITAL_BASED_OUTPATIENT_CLINIC_OR_DEPARTMENT_OTHER): Payer: Self-pay | Admitting: Family Medicine

## 2020-04-13 DIAGNOSIS — I10 Essential (primary) hypertension: Secondary | ICD-10-CM

## 2020-04-13 MED FILL — ALBUTEROL SULFATE HFA 108 (: 108 (90 BAS | 50 days supply | Qty: 54 | Fill #1

## 2020-04-13 MED FILL — BREO ELLIPTA 100-25 MCG INH: 100-25 | 90 days supply | Qty: 180 | Fill #1

## 2020-04-21 MED FILL — ALBUTEROL SULFATE HFA 108 (: 108 (90 BAS | 50 days supply | Qty: 54 | Fill #1

## 2020-04-21 MED FILL — BREO ELLIPTA 100-25 MCG INH: 100-25 | 90 days supply | Qty: 180 | Fill #1

## 2020-05-15 MED FILL — SERTRALINE HCL 50 MG TABLET: 50 | 90 days supply | Qty: 90 | Fill #1

## 2020-06-01 MED FILL — SERTRALINE HCL 50 MG TABLET: 50 | 90 days supply | Qty: 90 | Fill #1

## 2020-06-05 DIAGNOSIS — Z20822 Contact with and (suspected) exposure to covid-19: Secondary | ICD-10-CM | POA: Diagnosis not present

## 2020-06-07 NOTE — Progress Notes (Signed)
Virtual Visit via Video Note  I connected with Stacey Castaneda on 06/08/20 at  9:10 AM EST by a video enabled telemedicine application and verified that I am speaking with the correct person using two identifiers.   I discussed the limitations of evaluation and management by telemedicine and the availability of in person appointments. The patient expressed understanding and agreed to proceed.  Patient location: at home   Provider location: in office  Subjective:    CC: fever, diarrhea  HPI: She stated that her sxs began last Thursday with N/D. Saturday she starting feeling some better but was noticing that she was dehydrated so she began pushing more fluids. By Monday she was running a fever 102, + chills. She took a covid test Monday and her result came back negative. Fever has resolved.  NO ST, but she feels congested.  Some facial pressure.  Pt reports that she feels better now. the fatigue is much better.     Past medical history, Surgical history, Family history not pertinant except as noted below, Social history, Allergies, and medications have been entered into the medical record, reviewed, and corrections made.   Review of Systems: No fevers, chills, night sweats, weight loss, chest pain, or shortness of breath.   Objective:    General: Speaking clearly in complete sentences without any shortness of breath.  Alert and oriented x3.  Normal judgment. No apparent acute distress.    Impression and Recommendations:    No problem-specific Assessment & Plan notes found for this encounter.  Fever/Diarrhea - likely viral, possible flu.  She tested neg on day 4 of sxs.  Continue conservative care.  Call if not better after the weekend. Push fluids.  Work note provided.     Time spent in encounter 18 minutes  I discussed the assessment and treatment plan with the patient. The patient was provided an opportunity to ask questions and all were answered. The patient agreed with the  plan and demonstrated an understanding of the instructions.   The patient was advised to call back or seek an in-person evaluation if the symptoms worsen or if the condition fails to improve as anticipated.   Beatrice Lecher, MD

## 2020-06-08 ENCOUNTER — Other Ambulatory Visit (HOSPITAL_BASED_OUTPATIENT_CLINIC_OR_DEPARTMENT_OTHER): Payer: Self-pay | Admitting: Family Medicine

## 2020-06-08 ENCOUNTER — Telehealth (INDEPENDENT_AMBULATORY_CARE_PROVIDER_SITE_OTHER): Payer: BC Managed Care – PPO | Admitting: Family Medicine

## 2020-06-08 ENCOUNTER — Encounter: Payer: Self-pay | Admitting: Family Medicine

## 2020-06-08 ENCOUNTER — Telehealth: Payer: Self-pay | Admitting: Family Medicine

## 2020-06-08 VITALS — Temp 99.0°F

## 2020-06-08 DIAGNOSIS — R509 Fever, unspecified: Secondary | ICD-10-CM

## 2020-06-08 DIAGNOSIS — R0981 Nasal congestion: Secondary | ICD-10-CM

## 2020-06-08 DIAGNOSIS — R197 Diarrhea, unspecified: Secondary | ICD-10-CM

## 2020-06-08 MED ORDER — SYNJARDY XR 25-1000 MG PO TB24: 1.0000 | ORAL_TABLET | Freq: Every day | ORAL | 3 refills | Status: DC

## 2020-06-08 NOTE — Progress Notes (Signed)
She stated that her sxs began last Thursday with N/D. Saturday she starting feeling some better but was noticing that she was dehydrated so she began pushing more fluids. By Monday she was running a fever 102. She took a covid test Monday and her result came back negative.   She now has some nasal congestion and a dry cough and she has been using OTC saline nasal spray and eye drops  Pt reports that she feeling better now but thought that she should be seen

## 2020-06-08 NOTE — Telephone Encounter (Signed)
Received notification from pharmacy that Xigduo XR is not covered.  Covered drugs include [facility] [place], Synjardy XR, and Jardiance.  New prescription sent for Synjardy XR.  Please notify patient.

## 2020-06-12 NOTE — Telephone Encounter (Signed)
LMOM notifying pt of medication change.

## 2020-06-14 DIAGNOSIS — G4733 Obstructive sleep apnea (adult) (pediatric): Secondary | ICD-10-CM | POA: Diagnosis not present

## 2020-06-14 MED FILL — AMLODIPINE BESYLATE 5 MG TA: 5 | 90 days supply | Qty: 90 | Fill #0

## 2020-06-26 MED FILL — AMLODIPINE BESYLATE 5 MG TA: 5 | 90 days supply | Qty: 90 | Fill #0

## 2020-07-05 NOTE — Telephone Encounter (Signed)
Stacey Castaneda, Stacey Castaneda is an error.  It is for her husband

## 2020-07-05 NOTE — Telephone Encounter (Signed)
PA required for Synjardy XR.  I can't find Diabetes Mellitus anywhere in her chart so what diagnosis are we using?

## 2020-07-06 ENCOUNTER — Other Ambulatory Visit: Payer: Self-pay | Admitting: *Deleted

## 2020-07-06 NOTE — Telephone Encounter (Signed)
Removed from med list

## 2020-07-21 ENCOUNTER — Other Ambulatory Visit (HOSPITAL_BASED_OUTPATIENT_CLINIC_OR_DEPARTMENT_OTHER): Payer: Self-pay | Admitting: Family Medicine

## 2020-07-21 ENCOUNTER — Other Ambulatory Visit: Payer: Self-pay

## 2020-07-21 ENCOUNTER — Ambulatory Visit: Payer: BC Managed Care – PPO | Admitting: Family Medicine

## 2020-07-21 ENCOUNTER — Encounter: Payer: Self-pay | Admitting: Family Medicine

## 2020-07-21 VITALS — BP 120/74 | HR 110 | Ht 64.0 in

## 2020-07-21 DIAGNOSIS — I10 Essential (primary) hypertension: Secondary | ICD-10-CM | POA: Diagnosis not present

## 2020-07-21 DIAGNOSIS — F418 Other specified anxiety disorders: Secondary | ICD-10-CM | POA: Diagnosis not present

## 2020-07-21 MED ORDER — SERTRALINE HCL 100 MG PO TABS: 100.0000 mg | ORAL_TABLET | Freq: Every day | ORAL | 1 refills | Status: DC

## 2020-07-21 NOTE — Progress Notes (Signed)
Established Patient Office Visit  Subjective:  Patient ID: Stacey Castaneda, female    DOB: 02-Apr-1971  Age: 50 y.o. MRN: [id number]  CC:  Chief Complaint  Patient presents with  . Hypertension    HPI [name] presents for   Hypertension- Pt denies chest pain, SOB, dizziness, or heart palpitations.  Taking meds as directed w/o problems.  Denies medication side effects.    Increased stress and anxiety - has been a little more stressed and anxious since her husband is traveling soon and he has been keeping her awake at night. So she hasn't slept much.   Past Medical History:  Diagnosis Date  . Anxiety   . Depression   . Hypertension   . Perianal fistula   . Perirectal abscess 10/2012  . PONV (postoperative nausea and vomiting)   . Poor venous access    01-26-14 "states having PICC line placed on 01-27-14"  . Seasonal allergic rhinitis   . Thyroid goiter    BENIGN- remains "tested negative"  . Vitreous floaters of left eye    evaluated-not always an issue  . Wears contact lenses     Past Surgical History:  Procedure Laterality Date  . ANAL FISTULECTOMY N/A 01/28/2014   Procedure: LIGATION OF INTERNAL FISTULA TRACT, REMOVAL OF SETON ;  Surgeon: Leighton Ruff, MD;  Location: [facility];  Service: General;  Laterality: N/A;  . DILATATION OF CERVIX/  REMOVAL AND REPLACEMENT MIRENA  10-22-2010  . DILATION AND CURETTAGE OF UTERUS  1990's  . EVALUATION UNDER ANESTHESIA WITH ANAL FISTULECTOMY N/A 09/03/2013   Procedure: EXAM UNDER ANESTHESIA ;  Surgeon: Leighton Ruff, MD;  Location: [facility];  Service: General;  Laterality: N/A;  . HYSTEROSCOPY WITH D & C  08-22-2005  . INCISION AND DRAINAGE PERIRECTAL ABSCESS N/A 10/30/2012   Procedure: peri rectal INCISION AND DRAINAGE ABSCESS;  Surgeon: Earnstine Regal, MD;  Location: [facility];  Service: General;  Laterality: N/A;  . PLACEMENT OF SETON N/A 09/03/2013   Procedure: PLACEMENT OF SETON;  Surgeon: Leighton Ruff, MD;  Location: [facility];   Service: General;  Laterality: N/A;  . WISDOM TOOTH EXTRACTION  AGE 77    Family History  Problem Relation Age of Onset  . Diabetes Father   . Heart disease Father   . Hypertension Father   . Diabetes Mother   . Hypertension Mother   . Lung cancer Mother   . Breast cancer Maternal Grandmother     Social History   Socioeconomic History  . Marital status: Married    Spouse name: Not on file  . Number of children: Not on file  . Years of education: Not on file  . Highest education level: Not on file  Occupational History  . Occupation: Database administrator  Tobacco Use  . Smoking status: Former Smoker    Packs/day: 1.50    Years: 10.00    Pack years: 15.00    Types: Cigarettes    Quit date: 10/31/2006    Years since quitting: 13.7  . Smokeless tobacco: Never Used  Vaping Use  . Vaping Use: Never used  Substance and Sexual Activity  . Alcohol use: Yes    Comment: RARE  . Drug use: No  . Sexual activity: Yes    Birth control/protection: I.U.D.  Other Topics Concern  . Not on file  Social History Narrative  . Not on file   Social Determinants of Health   Financial Resource Strain: Not on file  Food Insecurity: Not  on file  Transportation Needs: Not on file  Physical Activity: Not on file  Stress: Not on file  Social Connections: Not on file  Intimate Partner Violence: Not on file    Outpatient Medications Prior to Visit  Medication Sig Dispense Refill  . albuterol (VENTOLIN HFA) 108 (90 Base) MCG/ACT inhaler Inhale 2 puffs into the lungs every 4 (four) hours as needed for wheezing or shortness of breath. 3 each 4  . AMBULATORY NON FORMULARY MEDICATION Medication Name: Please provide face mask, humidier, and all supplies. DX: [facility]  Fax: [phone] ([facility]) 1 Units PRN  . amLODipine (NORVASC) 5 MG tablet TAKE 1 TABLET (5 MG TOTAL) BY MOUTH DAILY. 90 tablet 1  . cetirizine (ZYRTEC) 10 MG tablet Take 10 mg by mouth every morning.     . docusate sodium (COLACE) 100  MG capsule Take 100 mg by mouth daily.    . fluticasone (FLONASE) 50 MCG/ACT nasal spray Place into both nostrils daily.    . fluticasone furoate-vilanterol (BREO ELLIPTA) 100-25 MCG/INH AEPB Inhale 1 puff into the lungs every morning. 180 each 3  . levonorgestrel (MIRENA) 20 MCG/24HR IUD 1 each by Intrauterine route once.    Marland Kitchen losartan-hydrochlorothiazide (HYZAAR) 100-12.5 MG tablet Take 1 tablet by mouth daily. 90 tablet 1  . Multiple Vitamins-Minerals (MULTIVITAMIN GUMMIES ADULT PO) Take 2 each by mouth every morning.    . sertraline (ZOLOFT) 50 MG tablet Take 1 tablet (50 mg total) by mouth daily. 90 tablet 1   No facility-administered medications prior to visit.    Allergies  Allergen Reactions  . Lisinopril Cough    ROS Review of Systems    Objective:    Physical Exam Constitutional:      Appearance: She is well-developed.  HENT:     Head: Normocephalic and atraumatic.  Cardiovascular:     Rate and Rhythm: Normal rate and regular rhythm.     Heart sounds: Normal heart sounds.  Pulmonary:     Effort: Pulmonary effort is normal.     Breath sounds: Normal breath sounds.  Skin:    General: Skin is warm and dry.  Neurological:     Mental Status: She is alert and oriented to person, place, and time.  Psychiatric:        Behavior: Behavior normal.     BP 120/74   Pulse (!) 110   Ht 5\' 4"  (1.626 m)   SpO2 94%   BMI 49.78 kg/m  Wt Readings from Last 3 Encounters:  11/05/18 290 lb (131.5 kg)  03/20/18 267 lb (121.1 kg)  02/03/18 277 lb (125.6 kg)     Health Maintenance Due  Topic Date Due  . COVID-19 Vaccine (3 - Booster for Pfizer series) 03/12/2020    There are no preventive care reminders to display for this patient.  Lab Results  Component Value Date   TSH 0.92 05/30/2017   Lab Results  Component Value Date   WBC 8.9 01/26/2014   HGB 14.1 01/26/2014   HCT 41.0 01/26/2014   MCV 85.4 01/26/2014   PLT 281 01/26/2014   Lab Results  Component  Value Date   NA 138 11/17/2019   K 4.1 11/17/2019   CO2 28 11/17/2019   GLUCOSE 95 11/17/2019   BUN 9 11/17/2019   CREATININE 0.66 11/17/2019   BILITOT 0.3 10/08/2019   ALKPHOS 70 05/21/2016   AST 16 10/08/2019   ALT 18 10/08/2019   PROT 6.6 10/08/2019   ALBUMIN 3.9 05/21/2016   CALCIUM  9.0 11/17/2019   ANIONGAP 14 01/26/2014   Lab Results  Component Value Date   CHOL 180 11/17/2019   Lab Results  Component Value Date   HDL 55 11/17/2019   Lab Results  Component Value Date   LDLCALC 106 (H) 11/17/2019   Lab Results  Component Value Date   TRIG 93 11/17/2019   Lab Results  Component Value Date   CHOLHDL 3.3 11/17/2019   Lab Results  Component Value Date   HGBA1C 4.9 05/21/2016      Assessment & Plan:   Problem List Items Addressed This Visit      Cardiovascular and Mediastinum   Essential hypertension - Primary    Well controlled. Continue current regimen. Follow up in  6 mo       Relevant Orders   BASIC METABOLIC PANEL WITH GFR     Other   Depression with anxiety    Stable overall but recent incrase in sxs. Discussed short term increase in regimen. Inc zoloft to 100mg  QD.   F/U in 3 mo.        Relevant Medications   sertraline (ZOLOFT) 100 MG tablet      Meds ordered this encounter  Medications  . sertraline (ZOLOFT) 100 MG tablet    Sig: Take 1 tablet (100 mg total) by mouth daily.    Dispense:  30 tablet    Refill:  1    Follow-up: Return in about 3 months (around 10/21/2020) for Hypertension/adjust mood medication .    Beatrice Lecher, MD

## 2020-07-24 ENCOUNTER — Encounter: Payer: Self-pay | Admitting: Family Medicine

## 2020-07-24 NOTE — Assessment & Plan Note (Addendum)
Stable overall but recent incrase in sxs. Discussed short term increase in regimen. Inc zoloft to 100mg  QD.   F/U in 3 mo.

## 2020-07-24 NOTE — Assessment & Plan Note (Signed)
Well controlled. Continue current regimen. Follow up in  6 mo  

## 2020-07-27 ENCOUNTER — Other Ambulatory Visit: Payer: Self-pay | Admitting: Family Medicine

## 2020-07-27 DIAGNOSIS — I10 Essential (primary) hypertension: Secondary | ICD-10-CM | POA: Diagnosis not present

## 2020-07-27 LAB — BASIC METABOLIC PANEL WITH GFR
BUN: 12 mg/dL (ref 7–25)
CO2: 25 mmol/L (ref 20–32)
Calcium: 8.5 mg/dL — ABNORMAL LOW (ref 8.6–10.2)
Chloride: 105 mmol/L (ref 98–110)
Creat: 0.76 mg/dL (ref 0.50–1.10)
GFR, Est African American: 107 mL/min/{1.73_m2} (ref >=60)
GFR, Est Non African American: 92 mL/min/{1.73_m2} (ref >=60)
Glucose, Bld: 123 mg/dL (ref 65–139)
Potassium: 4.2 mmol/L (ref 3.5–5.3)
Sodium: 139 mmol/L (ref 135–146)

## 2020-07-31 MED FILL — LOSARTAN POTASSIUM-HCTZ 100: 100-12.5 | 90 days supply | Qty: 90 | Fill #1

## 2020-08-09 DIAGNOSIS — Z20822 Contact with and (suspected) exposure to covid-19: Secondary | ICD-10-CM | POA: Diagnosis not present

## 2020-08-11 ENCOUNTER — Encounter: Payer: Self-pay | Admitting: Family Medicine

## 2020-08-11 ENCOUNTER — Other Ambulatory Visit (HOSPITAL_BASED_OUTPATIENT_CLINIC_OR_DEPARTMENT_OTHER): Payer: Self-pay

## 2020-08-11 ENCOUNTER — Encounter: Payer: Self-pay | Admitting: Family

## 2020-08-11 ENCOUNTER — Telehealth: Payer: BC Managed Care – PPO | Admitting: Family

## 2020-08-11 DIAGNOSIS — Z6841 Body Mass Index (BMI) 40.0 and over, adult: Secondary | ICD-10-CM

## 2020-08-11 DIAGNOSIS — U071 COVID-19: Secondary | ICD-10-CM

## 2020-08-11 DIAGNOSIS — J4530 Mild persistent asthma, uncomplicated: Secondary | ICD-10-CM

## 2020-08-11 MED ORDER — FLUTICASONE PROPIONATE 50 MCG/ACT NA SUSP
2.0000 | Freq: Every day | NASAL | 6 refills | Status: AC
  Filled 2020-08-11: qty 16, 30d supply, fill #0

## 2020-08-11 MED ORDER — DEXAMETHASONE 6 MG PO TABS
6.0000 mg | ORAL_TABLET | Freq: Two times a day (BID) | ORAL | 0 refills | Status: DC
  Filled 2020-08-11: qty 14, 7d supply, fill #0

## 2020-08-11 NOTE — Progress Notes (Signed)
Virtual Visit  Note Due to COVID-19 pandemic this visit was conducted virtually. This visit type was conducted due to national recommendations for restrictions regarding the COVID-19 Pandemic (e.g. social distancing, sheltering in place) in an effort to limit this patient's exposure and mitigate transmission in our community. All issues noted in this document were discussed and addressed.  A physical exam was not performed with this format.  I connected with Stacey Castaneda on 08/11/20 at 1:10 pm  by video and verified that I am speaking with the correct person using two identifiers. Stacey Castaneda is currently located at home and husband is currently with her during visit. The provider, Evelina Dun, FNP is located in their office at time of visit.  I discussed the limitations, risks, security and privacy concerns of performing an evaluation and management service by video and the availability of in person appointments. I also discussed with the patient that there may be a patient responsible charge related to this service. The patient expressed understanding and agreed to proceed.   History and Present Illness:  HPI Pt presents today with a positive COVID test. She states her symptoms started 08/02/20 and was tested for COVID on 08/07/20. She reports she is having congestion, productive cough of green sputum, sneezing, fatigue, and  no smell.  Denies constant SOB, but states she had one episode that lasted 10 mins. This resolved after using her rescue inhaler. She does have a history of asthma.   She reports she has had two vaccines. She has not had her booster. She reports her last vaccine 09/2019.  Review of Systems  Respiratory: Positive for cough. Negative for shortness of breath.   All other systems reviewed and are negative.    Observations/Objective: No SOB or distress noted, obese  Assessment and Plan: 1. COVID-19 virus detected - dexamethasone (DECADRON) 6 MG tablet;  Take 1 tablet (6 mg total) by mouth 2 (two) times daily.  Dispense: 14 tablet; Refill: 0 - [facility] COVID-19 HOME MONITORING PROGRAM; Future - fluticasone (FLONASE) 50 MCG/ACT nasal spray; Place 2 sprays into both nostrils daily.  Dispense: 16 g; Refill: 6  2. Mild persistent reactive airway disease without complication - dexamethasone (DECADRON) 6 MG tablet; Take 1 tablet (6 mg total) by mouth 2 (two) times daily.  Dispense: 14 tablet; Refill: 0  3. Class 3 severe obesity due to excess calories with serious comorbidity and body mass index (BMI) of 40.0 to 44.9 in adult (HCC) - dexamethasone (DECADRON) 6 MG tablet; Take 1 tablet (6 mg total) by mouth 2 (two) times daily.  Dispense: 14 tablet; Refill: 0   COVID positive, rest, force fluids, tylenol as needed, Quarantine for at least 5 days and fever free, report any worsening symptoms such as increased shortness of breath, swelling, or continued high fevers.     I discussed the assessment and treatment plan with the patient. The patient was provided an opportunity to ask questions and all were answered. The patient agreed with the plan and demonstrated an understanding of the instructions.   The patient was advised to call back or seek an in-person evaluation if the symptoms worsen or if the condition fails to improve as anticipated.  The above assessment and management plan was discussed with the patient. The patient verbalized understanding of and has agreed to the management plan. Patient is aware to call the clinic if symptoms persist or worsen. Patient is aware when to return to the clinic for a follow-up visit. Patient educated on  when it is appropriate to go to the emergency department.   Time call ended: 1:21 pm     I provided 11 minutes of   face-to-face time during this encounter.    Evelina Dun, FNP

## 2020-08-16 ENCOUNTER — Other Ambulatory Visit (HOSPITAL_BASED_OUTPATIENT_CLINIC_OR_DEPARTMENT_OTHER): Payer: Self-pay

## 2020-08-26 MED FILL — Sertraline HCl Tab 100 MG: ORAL | 30 days supply | Qty: 30 | Fill #0 | Status: AC

## 2020-08-28 ENCOUNTER — Other Ambulatory Visit (HOSPITAL_BASED_OUTPATIENT_CLINIC_OR_DEPARTMENT_OTHER): Payer: Self-pay

## 2020-08-29 ENCOUNTER — Other Ambulatory Visit (HOSPITAL_BASED_OUTPATIENT_CLINIC_OR_DEPARTMENT_OTHER): Payer: Self-pay

## 2020-08-29 ENCOUNTER — Ambulatory Visit: Payer: BC Managed Care – PPO | Attending: Internal Medicine

## 2020-08-29 DIAGNOSIS — Z23 Encounter for immunization: Secondary | ICD-10-CM

## 2020-08-29 NOTE — Progress Notes (Signed)
   Covid-19 Vaccination Clinic  Name:  Stacey Castaneda    MRN: [id number] DOB: 1970-07-12  08/29/2020  [name] was observed post Covid-19 immunization for 15 minutes without incident. She was provided with Vaccine Information Sheet and instruction to access the V-Safe system.   [name] was instructed to call 911 with any severe reactions post vaccine: Marland Kitchen Difficulty breathing  . Swelling of face and throat  . A fast heartbeat  . A bad rash all over body  . Dizziness and weakness   Immunizations Administered    Name Date Dose VIS Date Route   PFIZER Comrnaty(Gray TOP) Covid-19 Vaccine 08/29/2020  2:20 PM 0.3 mL 04/13/2020 Intramuscular   Manufacturer: [place]   Lot: [id number]   NDC: [phone]

## 2020-08-31 ENCOUNTER — Encounter: Payer: Self-pay | Admitting: Family Medicine

## 2020-08-31 ENCOUNTER — Other Ambulatory Visit (HOSPITAL_BASED_OUTPATIENT_CLINIC_OR_DEPARTMENT_OTHER): Payer: Self-pay

## 2020-08-31 MED ORDER — PFIZER-BIONT COVID-19 VAC-TRIS 30 MCG/0.3ML IM SUSP
INTRAMUSCULAR | 0 refills | Status: DC
  Filled 2020-08-31: qty 0.3, 1d supply, fill #0

## 2020-09-01 ENCOUNTER — Other Ambulatory Visit (HOSPITAL_BASED_OUTPATIENT_CLINIC_OR_DEPARTMENT_OTHER): Payer: Self-pay

## 2020-09-01 ENCOUNTER — Other Ambulatory Visit: Payer: Self-pay | Admitting: *Deleted

## 2020-09-01 DIAGNOSIS — G4733 Obstructive sleep apnea (adult) (pediatric): Secondary | ICD-10-CM

## 2020-09-01 MED ORDER — AMBULATORY NON FORMULARY MEDICATION: 99 refills | Status: DC

## 2020-09-16 ENCOUNTER — Other Ambulatory Visit: Payer: Self-pay | Admitting: Family Medicine

## 2020-09-16 MED FILL — Amlodipine Besylate Tab 5 MG (Base Equivalent): ORAL | 90 days supply | Qty: 90 | Fill #0 | Status: AC

## 2020-09-18 ENCOUNTER — Other Ambulatory Visit: Payer: Self-pay | Admitting: Family Medicine

## 2020-09-18 ENCOUNTER — Other Ambulatory Visit (HOSPITAL_BASED_OUTPATIENT_CLINIC_OR_DEPARTMENT_OTHER): Payer: Self-pay

## 2020-09-19 ENCOUNTER — Other Ambulatory Visit (HOSPITAL_BASED_OUTPATIENT_CLINIC_OR_DEPARTMENT_OTHER): Payer: Self-pay

## 2020-09-19 ENCOUNTER — Other Ambulatory Visit: Payer: Self-pay | Admitting: Family Medicine

## 2020-09-19 ENCOUNTER — Other Ambulatory Visit: Payer: Self-pay | Admitting: *Deleted

## 2020-09-19 DIAGNOSIS — F418 Other specified anxiety disorders: Secondary | ICD-10-CM

## 2020-09-19 MED ORDER — SERTRALINE HCL 100 MG PO TABS
100.0000 mg | ORAL_TABLET | Freq: Every day | ORAL | 1 refills | Status: DC
  Filled 2020-09-19 – 2020-10-03 (×3): qty 30, 30d supply, fill #0
  Filled 2020-11-06: qty 30, 30d supply, fill #1

## 2020-09-20 ENCOUNTER — Other Ambulatory Visit (HOSPITAL_BASED_OUTPATIENT_CLINIC_OR_DEPARTMENT_OTHER): Payer: Self-pay

## 2020-09-21 ENCOUNTER — Other Ambulatory Visit (HOSPITAL_BASED_OUTPATIENT_CLINIC_OR_DEPARTMENT_OTHER): Payer: Self-pay

## 2020-09-25 ENCOUNTER — Other Ambulatory Visit (HOSPITAL_BASED_OUTPATIENT_CLINIC_OR_DEPARTMENT_OTHER): Payer: Self-pay

## 2020-10-03 ENCOUNTER — Other Ambulatory Visit (HOSPITAL_BASED_OUTPATIENT_CLINIC_OR_DEPARTMENT_OTHER): Payer: Self-pay

## 2020-10-20 ENCOUNTER — Ambulatory Visit: Payer: BC Managed Care – PPO | Admitting: Family Medicine

## 2020-11-06 ENCOUNTER — Other Ambulatory Visit: Payer: Self-pay | Admitting: Family Medicine

## 2020-11-07 ENCOUNTER — Other Ambulatory Visit (HOSPITAL_BASED_OUTPATIENT_CLINIC_OR_DEPARTMENT_OTHER): Payer: Self-pay

## 2020-11-07 MED ORDER — LOSARTAN POTASSIUM-HCTZ 100-12.5 MG PO TABS
1.0000 | ORAL_TABLET | Freq: Every day | ORAL | 1 refills | Status: DC
  Filled 2020-11-07: qty 90, 90d supply, fill #0
  Filled 2021-01-29: qty 90, 90d supply, fill #1

## 2020-11-09 ENCOUNTER — Ambulatory Visit: Payer: BC Managed Care – PPO | Admitting: Family Medicine

## 2020-11-09 ENCOUNTER — Encounter: Payer: Self-pay | Admitting: Family Medicine

## 2020-11-09 ENCOUNTER — Other Ambulatory Visit: Payer: Self-pay

## 2020-11-09 ENCOUNTER — Other Ambulatory Visit (HOSPITAL_BASED_OUTPATIENT_CLINIC_OR_DEPARTMENT_OTHER): Payer: Self-pay

## 2020-11-09 VITALS — BP 128/72 | HR 87 | Ht 64.0 in

## 2020-11-09 DIAGNOSIS — J4540 Moderate persistent asthma, uncomplicated: Secondary | ICD-10-CM | POA: Diagnosis not present

## 2020-11-09 DIAGNOSIS — G4733 Obstructive sleep apnea (adult) (pediatric): Secondary | ICD-10-CM | POA: Diagnosis not present

## 2020-11-09 DIAGNOSIS — F418 Other specified anxiety disorders: Secondary | ICD-10-CM | POA: Diagnosis not present

## 2020-11-09 DIAGNOSIS — J392 Other diseases of pharynx: Secondary | ICD-10-CM

## 2020-11-09 DIAGNOSIS — I10 Essential (primary) hypertension: Secondary | ICD-10-CM | POA: Diagnosis not present

## 2020-11-09 MED ORDER — AMLODIPINE BESYLATE 5 MG PO TABS
ORAL_TABLET | Freq: Every day | ORAL | 1 refills | Status: DC
  Filled 2020-11-09 – 2020-12-17 (×2): qty 90, 90d supply, fill #0
  Filled 2021-03-08: qty 90, 90d supply, fill #1

## 2020-11-09 MED ORDER — SERTRALINE HCL 100 MG PO TABS
150.0000 mg | ORAL_TABLET | Freq: Every day | ORAL | 1 refills | Status: DC
Start: 2020-11-09 — End: 2021-06-19
  Filled 2020-11-09 – 2020-12-04 (×3): qty 135, 90d supply, fill #0
  Filled 2021-03-08: qty 135, 90d supply, fill #1

## 2020-11-09 MED ORDER — FLUTICASONE FUROATE-VILANTEROL 100-25 MCG/INH IN AEPB
1.0000 | INHALATION_SPRAY | RESPIRATORY_TRACT | 3 refills | Status: DC
  Filled 2020-11-09: qty 180, 90d supply, fill #0

## 2020-11-09 MED ORDER — ALBUTEROL SULFATE HFA 108 (90 BASE) MCG/ACT IN AERS
2.0000 | INHALATION_SPRAY | RESPIRATORY_TRACT | 4 refills | Status: DC | PRN
  Filled 2020-11-09: qty 54, 90d supply, fill #0

## 2020-11-09 NOTE — Assessment & Plan Note (Signed)
Increase sertraline to 150 mg so that is 1-1/2 tabs.  We will send new prescription to pharmacy.  Plan to follow-up in 6 months or sooner if needed.

## 2020-11-09 NOTE — Progress Notes (Signed)
Established Patient Office Visit  Subjective:  Patient ID: Stacey Castaneda, female    DOB: [birth year]  Age: 50 y.o. MRN: [id number]  CC:  Chief Complaint  Patient presents with   Hypertension    HPI [name] presents for   Hypertension- Pt denies chest pain, SOB, dizziness, or heart palpitations.  Taking meds as directed w/o problems.  Denies medication side effects.    F/U depression and anxiety  -he does feel like sertraline is helping some but wonders if we could go up on her dose she is currently taking 100 mg daily.  She is also still feeling like she is short of breath more in her neck and throat area and she most feels like something is stuck in the back of her throat and almost every morning she will wake up and spit out a little bit of yellow-green phlegm.  During the day she is coughing a little and doing some throat clearing but not continuing to produce mucus it seems to be just first thing in the morning.  She does use Flonase occasionally.  He does want to confirm that we are do 90-day supplies on her medications.   Past Medical History:  Diagnosis Date   Anxiety    Depression    Hypertension    Perianal fistula    Perirectal abscess 10/2012   PONV (postoperative nausea and vomiting)    Poor venous access    01-26-14 "states having PICC line placed on 01-27-14"   Seasonal allergic rhinitis    Thyroid goiter    BENIGN- remains "tested negative"   Vitreous floaters of left eye    evaluated-not always an issue   Wears contact lenses     Past Surgical History:  Procedure Laterality Date   ANAL FISTULECTOMY N/A 01/28/2014   Procedure: LIGATION OF INTERNAL FISTULA TRACT, REMOVAL OF SETON ;  Surgeon: Leighton Ruff, MD;  Location: [facility];  Service: General;  Laterality: N/A;   DILATATION OF CERVIX/  REMOVAL AND REPLACEMENT MIRENA  10-22-2010   DILATION AND CURETTAGE OF UTERUS  1990's   EVALUATION UNDER ANESTHESIA WITH ANAL FISTULECTOMY N/A 09/03/2013    Procedure: EXAM UNDER ANESTHESIA ;  Surgeon: Leighton Ruff, MD;  Location: [facility];  Service: General;  Laterality: N/A;   HYSTEROSCOPY WITH D & C  08-22-2005   INCISION AND DRAINAGE PERIRECTAL ABSCESS N/A 10/30/2012   Procedure: peri rectal INCISION AND DRAINAGE ABSCESS;  Surgeon: Earnstine Regal, MD;  Location: [facility];  Service: General;  Laterality: N/A;   PLACEMENT OF SETON N/A 09/03/2013   Procedure: PLACEMENT OF SETON;  Surgeon: Leighton Ruff, MD;  Location: [facility];  Service: General;  Laterality: N/A;   WISDOM TOOTH EXTRACTION  AGE 73    Family History  Problem Relation Age of Onset   Diabetes Father    Heart disease Father    Hypertension Father    Diabetes Mother    Hypertension Mother    Lung cancer Mother    Breast cancer Maternal Grandmother     Social History   Socioeconomic History   Marital status: Married    Spouse name: Not on file   Number of children: Not on file   Years of education: Not on file   Highest education level: Not on file  Occupational History   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  Other Topics Concern   Not on file  Social History Narrative   Not on file   Social Determinants of Health   Financial Resource Strain: Not on file  Food Insecurity: Not on file  Transportation Needs: Not on file  Physical Activity: Not on file  Stress: Not on file  Social Connections: Not on file  Intimate Partner Violence: Not on file    Outpatient Medications Prior to Visit  Medication Sig Dispense Refill   losartan-hydrochlorothiazide (HYZAAR) 100-12.5 MG tablet Take 1 tablet by mouth  daily. 90 tablet 1   AMBULATORY NON FORMULARY MEDICATION Medication Name: Please provide face mask, humidier, and all supplies. DX: [facility]  Fax: [phone] ([facility]) 1 Units PRN   cetirizine (ZYRTEC) 10 MG tablet Take 10 mg by mouth every morning.      docusate sodium (COLACE) 100 MG capsule Take 100 mg by mouth daily.     fluticasone (FLONASE) 50 MCG/ACT nasal spray Place 2 sprays into both nostrils daily. 16 g 6   levonorgestrel (MIRENA) 20 MCG/24HR IUD 1 each by Intrauterine route once.     Multiple Vitamins-Minerals (MULTIVITAMIN GUMMIES ADULT PO) Take 2 each by mouth every morning.     albuterol (VENTOLIN HFA) 108 (90 Base) MCG/ACT inhaler Inhale 2 puffs into the lungs every 4 (four) hours as needed for wheezing or shortness of breath. 3 each 4   albuterol (VENTOLIN HFA) 108 (90 Base) MCG/ACT inhaler INHALE 2 PUFFS BY MOUTH INTO THE LUNGS EVERY 4 (FOUR) HOURS AS NEEDED FOR WHEEZING OR SHORTNESS OF BREATH. 54 g 4   amLODipine (NORVASC) 5 MG tablet TAKE 1 TABLET (5 MG TOTAL) BY MOUTH DAILY. 90 tablet 1   amLODipine (NORVASC) 5 MG tablet TAKE 1 TABLET (5 MG TOTAL) BY MOUTH DAILY. 90 tablet 1   amLODipine (NORVASC) 5 MG tablet TAKE 1 TABLET (5 MG TOTAL) BY MOUTH DAILY. 90 tablet 1   COVID-19 mRNA Vac-TriS, Pfizer, (PFIZER-BIONT COVID-19 VAC-TRIS) SUSP injection Inject into the muscle. 0.3 mL 0   dexamethasone (DECADRON) 6 MG tablet Take 1 tablet (6 mg total) by mouth 2 (two) times daily. 14 tablet 0   Empagliflozin-metFORMIN HCl ER 25-1000 MG TB24 TAKE 1 TABLET BY MOUTH ONCE DAILY 30 tablet 3   fluticasone furoate-vilanterol (BREO ELLIPTA) 100-25 MCG/INH AEPB Inhale 1 puff into the lungs every morning. 180 each 3   fluticasone furoate-vilanterol (BREO ELLIPTA) 100-25 MCG/INH AEPB INHALE 1 PUFF INTO THE LUNGS EVERY MORNING. 180 each 3   losartan (COZAAR) 100 MG tablet TAKE 1 TABLET (100 MG TOTAL) BY MOUTH DAILY. 90 tablet 0   losartan-hydrochlorothiazide (HYZAAR) 100-12.5 MG tablet Take 1 tablet  by mouth daily. 90 tablet 1   sertraline (ZOLOFT) 100 MG tablet Take 1 tablet (100 mg total) by mouth daily. 30 tablet 1   sertraline (ZOLOFT) 50 MG tablet TAKE 1 TABLET (50 MG TOTAL) BY MOUTH DAILY. 90 tablet 1   No facility-administered medications prior to visit.    Allergies  Allergen Reactions   Lisinopril Cough    ROS Review of Systems    Objective:    Physical Exam  BP 128/72   Pulse  87   Ht 5\' 4"  (1.626 m)   SpO2 96%   BMI 49.78 kg/m  Wt Readings from Last 3 Encounters:  11/05/18 290 lb (131.5 kg)  03/20/18 267 lb (121.1 kg)  02/03/18 277 lb (125.6 kg)     Health Maintenance Due  Topic Date Due   Zoster Vaccines- Shingrix (1 of 2) Never done    There are no preventive care reminders to display for this patient.  Lab Results  Component Value Date   TSH 0.92 05/30/2017   Lab Results  Component Value Date   WBC 8.9 01/26/2014   HGB 14.1 01/26/2014   HCT 41.0 01/26/2014   MCV 85.4 01/26/2014   PLT 281 01/26/2014   Lab Results  Component Value Date   NA 139 07/27/2020   K 4.2 07/27/2020   CO2 25 07/27/2020   GLUCOSE 123 07/27/2020   BUN 12 07/27/2020   CREATININE 0.76 07/27/2020   BILITOT 0.3 10/08/2019   ALKPHOS 70 05/21/2016   AST 16 10/08/2019   ALT 18 10/08/2019   PROT 6.6 10/08/2019   ALBUMIN 3.9 05/21/2016   CALCIUM 8.5 (L) 07/27/2020   ANIONGAP 14 01/26/2014   Lab Results  Component Value Date   CHOL 180 11/17/2019   Lab Results  Component Value Date   HDL 55 11/17/2019   Lab Results  Component Value Date   LDLCALC 106 (H) 11/17/2019   Lab Results  Component Value Date   TRIG 93 11/17/2019   Lab Results  Component Value Date   CHOLHDL 3.3 11/17/2019   Lab Results  Component Value Date   HGBA1C 4.9 05/21/2016      Assessment & Plan:   Problem List Items Addressed This Visit       Cardiovascular and Mediastinum   Essential hypertension - Primary    Well controlled. Continue current regimen. Follow up in  6  mo        Relevant Medications   amLODipine (NORVASC) 5 MG tablet   Other Relevant Orders   Lipid Panel w/reflex Direct LDL   TSH   COMPLETE METABOLIC PANEL WITH GFR     Respiratory   Reactive airway disease   Relevant Medications   albuterol (VENTOLIN HFA) 108 (90 Base) MCG/ACT inhaler   fluticasone furoate-vilanterol (BREO ELLIPTA) 100-25 MCG/INH AEPB   Obstructive sleep apnea     Other   Depression with anxiety    Increase sertraline to 150 mg so that is 1-1/2 tabs.  We will send new prescription to pharmacy.  Plan to follow-up in 6 months or sooner if needed.       Relevant Medications   sertraline (ZOLOFT) 100 MG tablet   Other Visit Diagnoses     Throat irritation          Throat irritation-May be secondary to postnasal drip and drainage versus possible GERD.  She already sleeps with her head elevated at night and has not been having any reflux symptoms per se.  We did discuss restarting her Flonase and getting back on that for the next 2 to 3 weeks to see if her symptoms improve if not consider referral to ENT for further evaluation.  She has not had any wheezing.  Meds ordered this encounter  Medications   albuterol (VENTOLIN HFA) 108 (90 Base) MCG/ACT inhaler    Sig: Inhale 2 puffs into the lungs every 4 (four) hours as needed for wheezing or shortness of breath.    Dispense:  54 g  Refill:  4   amLODipine (NORVASC) 5 MG tablet    Sig: TAKE 1 TABLET (5 MG TOTAL) BY MOUTH DAILY.    Dispense:  90 tablet    Refill:  1   fluticasone furoate-vilanterol (BREO ELLIPTA) 100-25 MCG/INH AEPB    Sig: Inhale 1 puff into the lungs every morning.    Dispense:  180 each    Refill:  3   sertraline (ZOLOFT) 100 MG tablet    Sig: Take 1 & 1/2 tablets (150 mg total) by mouth daily.    Dispense:  135 tablet    Refill:  1     Follow-up: Return in about 6 months (around 05/12/2021) for Hypertension.    Beatrice Lecher, MD

## 2020-11-09 NOTE — Assessment & Plan Note (Signed)
Well controlled. Continue current regimen. Follow up in  6 mo  

## 2020-11-16 ENCOUNTER — Other Ambulatory Visit (HOSPITAL_BASED_OUTPATIENT_CLINIC_OR_DEPARTMENT_OTHER): Payer: Self-pay

## 2020-11-17 ENCOUNTER — Other Ambulatory Visit (HOSPITAL_BASED_OUTPATIENT_CLINIC_OR_DEPARTMENT_OTHER): Payer: Self-pay

## 2020-11-27 ENCOUNTER — Other Ambulatory Visit (HOSPITAL_BASED_OUTPATIENT_CLINIC_OR_DEPARTMENT_OTHER): Payer: Self-pay

## 2020-12-01 ENCOUNTER — Encounter: Payer: Self-pay | Admitting: Family Medicine

## 2020-12-04 ENCOUNTER — Other Ambulatory Visit (HOSPITAL_BASED_OUTPATIENT_CLINIC_OR_DEPARTMENT_OTHER): Payer: Self-pay

## 2020-12-07 ENCOUNTER — Encounter: Payer: Self-pay | Admitting: Osteopathic Medicine

## 2020-12-07 ENCOUNTER — Ambulatory Visit: Payer: BC Managed Care – PPO | Admitting: Osteopathic Medicine

## 2020-12-07 VITALS — BP 138/58 | HR 81 | Temp 99.1°F

## 2020-12-07 DIAGNOSIS — M79645 Pain in left finger(s): Secondary | ICD-10-CM | POA: Diagnosis not present

## 2020-12-07 DIAGNOSIS — I10 Essential (primary) hypertension: Secondary | ICD-10-CM | POA: Diagnosis not present

## 2020-12-07 DIAGNOSIS — G4733 Obstructive sleep apnea (adult) (pediatric): Secondary | ICD-10-CM | POA: Diagnosis not present

## 2020-12-07 NOTE — Patient Instructions (Signed)
Keep an eye on the finger - this looks like some sort of small rupture of a vein causing bruising and swelling, sometimes this can be spontaneous (happen for no good reason) or in response to stress/injury on the tissue. I'm not concerned for anything serious, OK to use ice for the first few days then heat. NOTE: if end of finger loses sensation or gets very pale/purple/red, these are signs of compromise to the circulation/nerves and this can be an emergency - please go to ER or call us to see if we can get you I with a hand specialist asap!

## 2020-12-07 NOTE — Progress Notes (Signed)
Stacey Castaneda is a 50 y.o. female who presents to  [place] [facility]  today, 12/07/20, seeking care for the following:  Left thumb concern - noted some swelling and tenderness on pad of distal L thumb, see photos. No actual injury, got into her car, turned it on, felt thumb pain when she went to put the vehicle into drive. Looked and it was a bit swollen. Improved now but not resolved.             ASSESSMENT & PLAN with other pertinent findings:  The encounter diagnosis was Pain of left thumb.    Patient Instructions  Keep an eye on the finger - this looks like some sort of small rupture of a vein causing bruising and swelling, sometimes this can be spontaneous (happen for no good reason) or in response to stress/injury on the tissue. I'm not concerned for anything serious, OK to use ice for the first few days then heat. NOTE: if end of finger loses sensation or gets very pale/purple/red, these are signs of compromise to the circulation/nerves and this can be an emergency - please go to ER or call us to see if we can get you I with a hand specialist asap!   No orders of the defined types were placed in this encounter.   No orders of the defined types were placed in this encounter.    See below for relevant physical exam findings  See below for recent lab and imaging results reviewed  Medications, allergies, PMH, PSH, SocH, FamH reviewed below    Follow-up instructions: Return if symptoms worsen or fail to improve.                                        Exam:  BP (!) 138/58 (BP Location: Left Arm, Patient Position: Sitting, Cuff Size: Large)   Pulse 81   Temp 99.1 F (37.3 C) (Oral)  Constitutional: VS see above. General Appearance: alert, well-developed, well-nourished, NAD Neck: No masses, trachea midline.  Respiratory: Normal respiratory effort.  Musculoskeletal: Gait normal.  Symmetric and independent movement of all extremities. L THUMB: sensation INTACT at distal digit, NORMAL temperature distal digit, mild tenderness, normal ROM at joints Neurological: Normal balance/coordination. No tremor. Skin: warm, dry, intact.  Psychiatric: Normal judgment/insight. Normal mood and affect. Oriented x3.   Current Meds  Medication Sig   albuterol (VENTOLIN HFA) 108 (90 Base) MCG/ACT inhaler Inhale 2 puffs into the lungs every 4 (four) hours as needed for wheezing or shortness of breath.   AMBULATORY NON FORMULARY MEDICATION Medication Name: Please provide face mask, humidier, and all supplies. DX: [facility]  Fax: [phone] ([facility])   amLODipine (NORVASC) 5 MG tablet TAKE 1 TABLET (5 MG TOTAL) BY MOUTH DAILY.   cetirizine (ZYRTEC) 10 MG tablet Take 10 mg by mouth every morning.    docusate sodium (COLACE) 100 MG capsule Take 100 mg by mouth daily.   fluticasone (FLONASE) 50 MCG/ACT nasal spray Place 2 sprays into both nostrils daily.   fluticasone furoate-vilanterol (BREO ELLIPTA) 100-25 MCG/INH AEPB Inhale 1 puff into the lungs every morning.   levonorgestrel (MIRENA) 20 MCG/24HR IUD 1 each by Intrauterine route once.   losartan-hydrochlorothiazide (HYZAAR) 100-12.5 MG tablet Take 1 tablet by mouth daily.   Multiple Vitamins-Minerals (MULTIVITAMIN GUMMIES ADULT PO) Take 2 each by mouth every morning.   sertraline (ZOLOFT)  100 MG tablet Take 1 & 1/2 tablets (150 mg total) by mouth daily.    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   Obesity, Class III, BMI 40-49.9 (morbid obesity) ([place]) 02/07/2012   Hirsutism 11/29/2010   Anxiety state 02/22/2010   Depression with anxiety 02/22/2010   Essential hypertension 02/22/2010    Family History  Problem Relation Age of Onset   Diabetes Father    Heart disease Father    Hypertension Father    Diabetes Mother    Hypertension Mother    Lung cancer Mother    Breast cancer Maternal Grandmother     Social History   Tobacco Use  Smoking Status Former   Packs/day: 1.50   Years: 10.00   Pack years: 15.00   Types: Cigarettes   Quit date: 10/31/2006   Years since quitting: 14.1  Smokeless Tobacco Never    Past Surgical History:  Procedure Laterality Date   ANAL FISTULECTOMY N/A 01/28/2014   Procedure: LIGATION OF INTERNAL FISTULA TRACT, REMOVAL OF SETON ;  Surgeon: Leighton Ruff, MD;  Location: [facility];  Service: General;  Laterality: N/A;   DILATATION OF CERVIX/  REMOVAL AND REPLACEMENT MIRENA  10-22-2010   DILATION AND CURETTAGE OF UTERUS  1990's   EVALUATION UNDER ANESTHESIA WITH ANAL FISTULECTOMY N/A 09/03/2013   Procedure: EXAM UNDER ANESTHESIA ;  Surgeon: Leighton Ruff, MD;  Location: [facility];  Service: General;  Laterality: N/A;   HYSTEROSCOPY WITH D & C  08-22-2005   INCISION AND DRAINAGE PERIRECTAL ABSCESS N/A 10/30/2012   Procedure: peri rectal INCISION AND DRAINAGE ABSCESS;  Surgeon: Earnstine Regal, MD;  Location: [facility];  Service: General;  Laterality: N/A;   PLACEMENT OF SETON N/A 09/03/2013   Procedure: PLACEMENT OF SETON;  Surgeon: Leighton Ruff, MD;  Location: [facility];  Service: General;  Laterality: N/A;   WISDOM TOOTH EXTRACTION  AGE 89    Immunization History  Administered Date(s) Administered   Influenza,inj,Quad PF,6+ Mos 03/16/2013, 02/03/2018, 02/08/2019, 03/03/2020   Influenza,inj,quad, With Preservative 02/04/2017    Influenza-Unspecified 01/10/2014, 02/14/2015, 02/07/2016, 02/07/2016   PFIZER Comirnaty(Gray Top)Covid-19 Tri-Sucrose Vaccine 08/29/2020   PFIZER(Purple Top)SARS-COV-2 Vaccination 08/13/2019, 09/10/2019   Pneumococcal Polysaccharide-23 03/30/2019   Tdap 09/26/2017    No results found for this or any previous visit (from the past 2160 hour(s)).  No results found.     All questions at time of visit were answered - patient instructed to contact office with any additional concerns or updates. ER/RTC precautions were reviewed with the patient as applicable.   Please note: manual typing as well as voice recognition software may have been used to produce this document - typos may escape review. Please contact Dr. Sheppard Coil for any needed clarifications.

## 2020-12-08 LAB — COMPLETE METABOLIC PANEL WITH GFR
AG Ratio: 1.6 (calc) (ref 1.0–2.5)
ALT: 20 U/L (ref 6–29)
AST: 18 U/L (ref 10–35)
Albumin: 4 g/dL (ref 3.6–5.1)
Alkaline phosphatase (APISO): 80 U/L (ref 37–153)
BUN: 7 mg/dL (ref 7–25)
CO2: 24 mmol/L (ref 20–32)
Calcium: 8.6 mg/dL (ref 8.6–10.4)
Chloride: 105 mmol/L (ref 98–110)
Creat: 0.6 mg/dL (ref 0.50–1.03)
Globulin: 2.5 g/dL (calc) (ref 1.9–3.7)
Glucose, Bld: 91 mg/dL (ref 65–99)
Potassium: 4 mmol/L (ref 3.5–5.3)
Sodium: 138 mmol/L (ref 135–146)
Total Bilirubin: 0.4 mg/dL (ref 0.2–1.2)
Total Protein: 6.5 g/dL (ref 6.1–8.1)
eGFR: 109 mL/min/{1.73_m2} (ref >=60)

## 2020-12-08 LAB — LIPID PANEL W/REFLEX DIRECT LDL
Cholesterol: 169 mg/dL (ref <200)
HDL: 66 mg/dL (ref >=50)
LDL Cholesterol (Calc): 86 mg/dL (calc)
Non-HDL Cholesterol (Calc): 103 mg/dL (calc) (ref <130)
Total CHOL/HDL Ratio: 2.6 (calc) (ref <5.0)
Triglycerides: 78 mg/dL (ref <150)

## 2020-12-08 LAB — TSH: TSH: 1.33 mIU/L

## 2020-12-18 ENCOUNTER — Other Ambulatory Visit (HOSPITAL_BASED_OUTPATIENT_CLINIC_OR_DEPARTMENT_OTHER): Payer: Self-pay

## 2021-01-11 IMAGING — MG DIGITAL SCREENING BILAT W/ TOMO W/ CAD
8 of 14 series · 8 of 40 positions shown · non-contrast
Comparison: Previous exam(s).

CLINICAL DATA: Screening.

EXAM:
DIGITAL SCREENING BILATERAL MAMMOGRAM WITH TOMO AND CAD

[L CC synth-2D (1 of 2)]
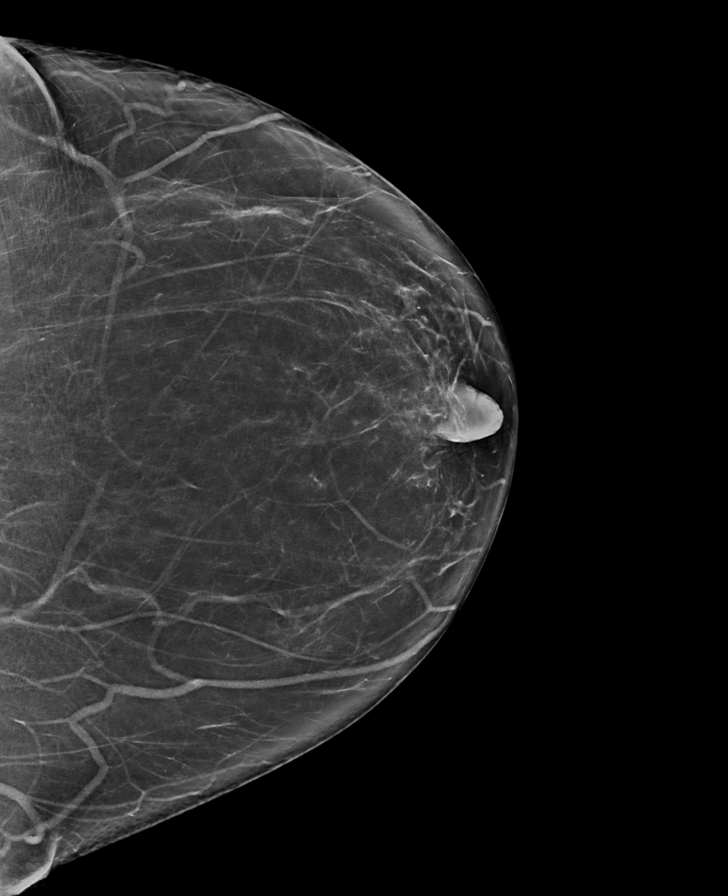

[R CC synth-2D (1 of 2)]
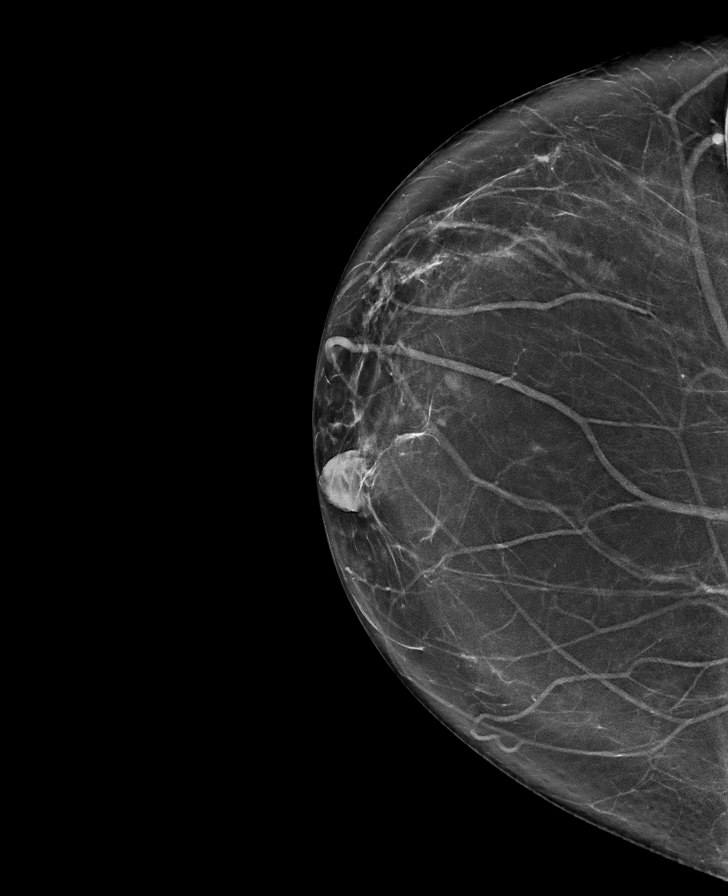

[L MLO synth-2D (1 of 2)]
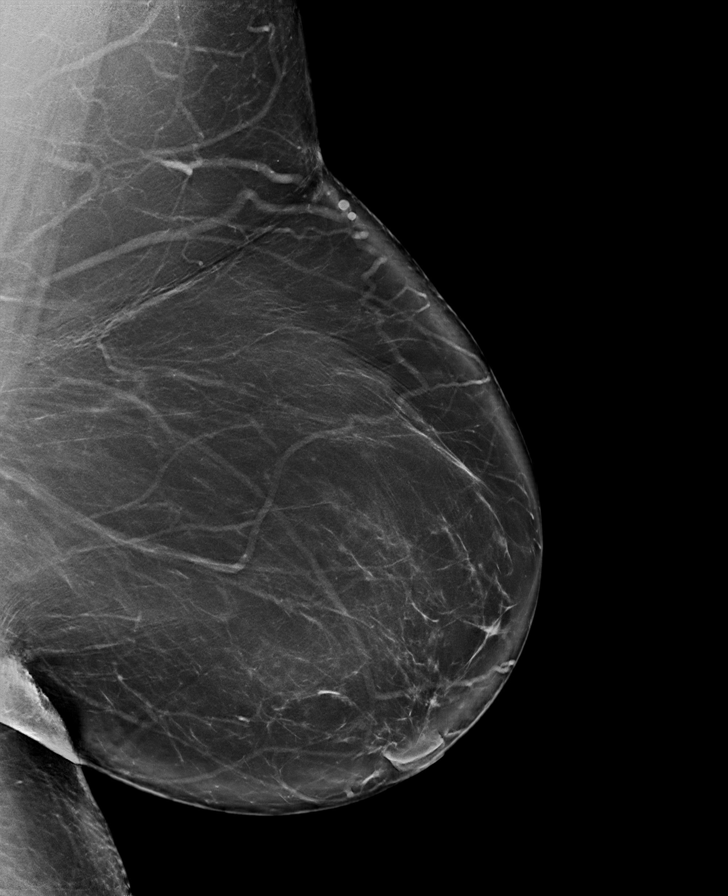

[R MLO synth-2D]
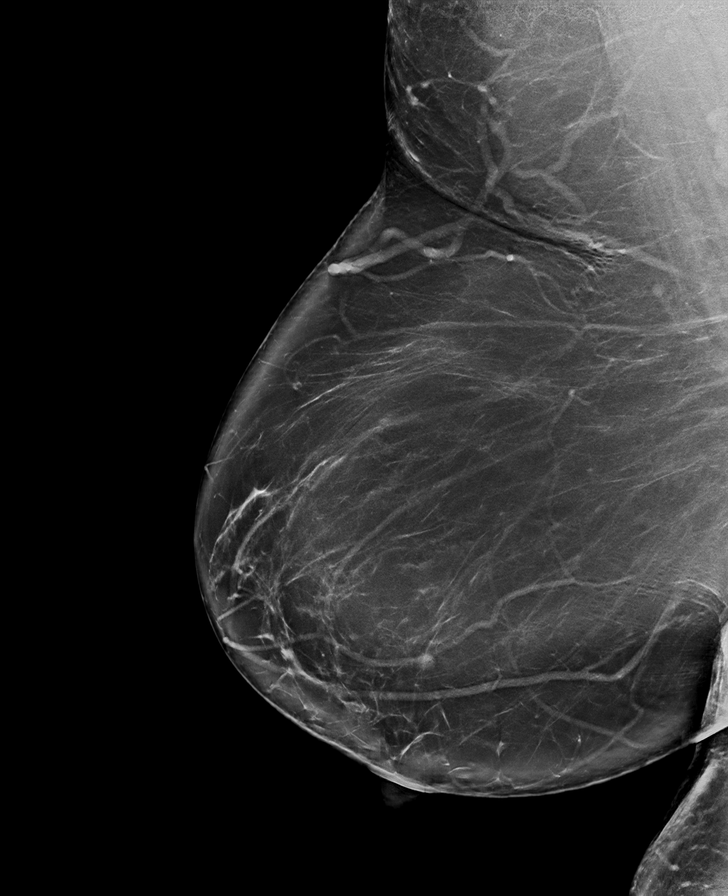

[L CC synth-2D (2 of 2)]
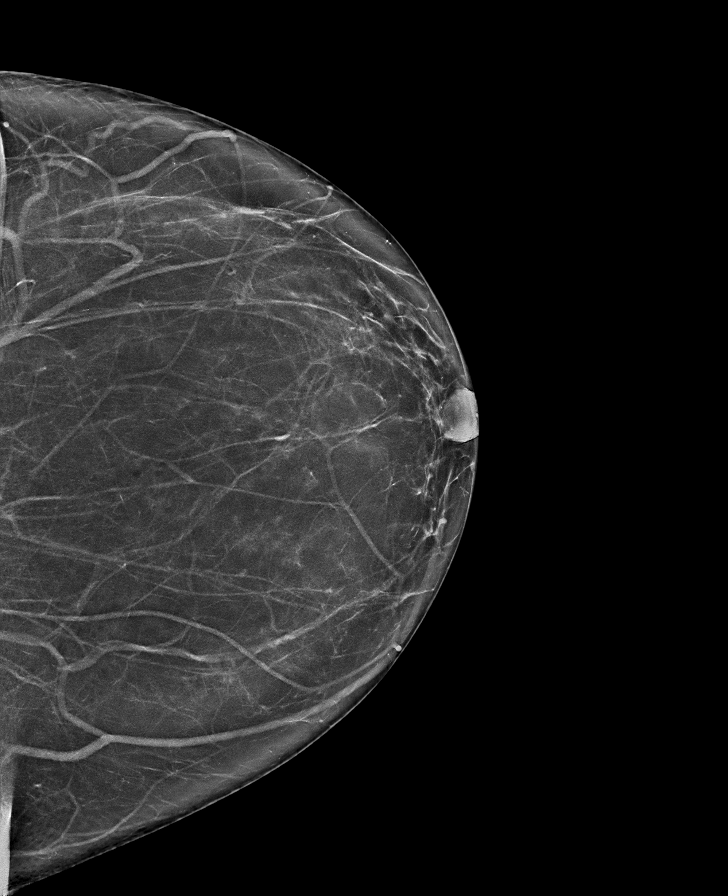

[L MLO synth-2D (2 of 2)]
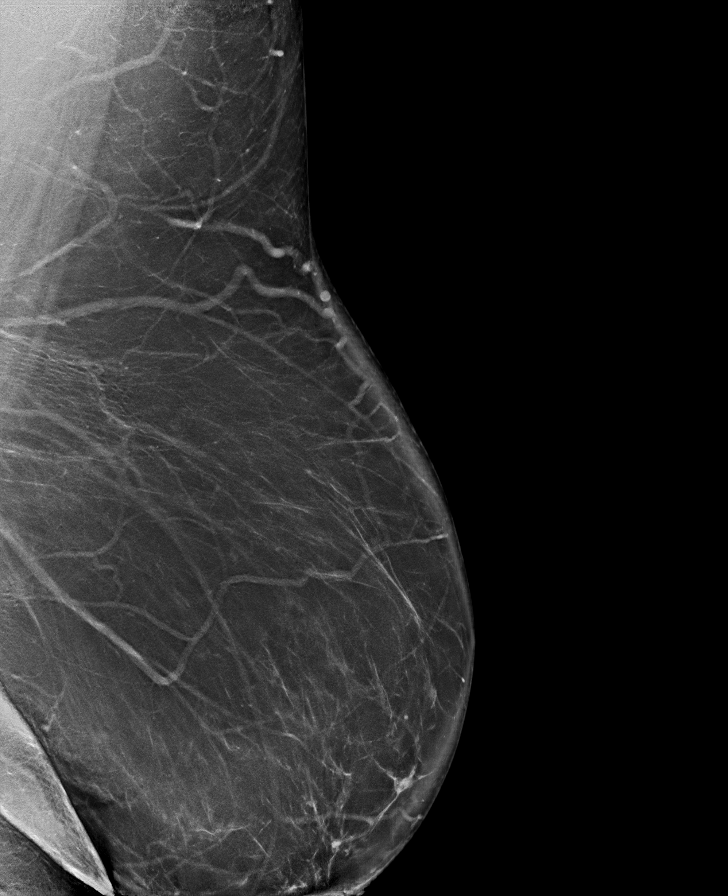

[R CC synth-2D (2 of 2)]
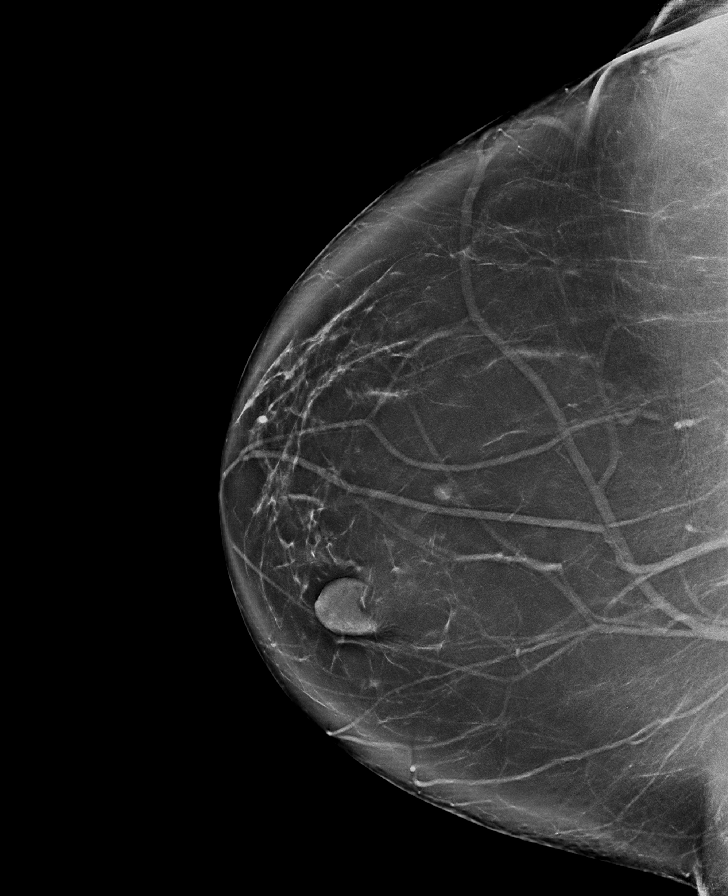

[L CC tomo · tomo slice 41/82.0]
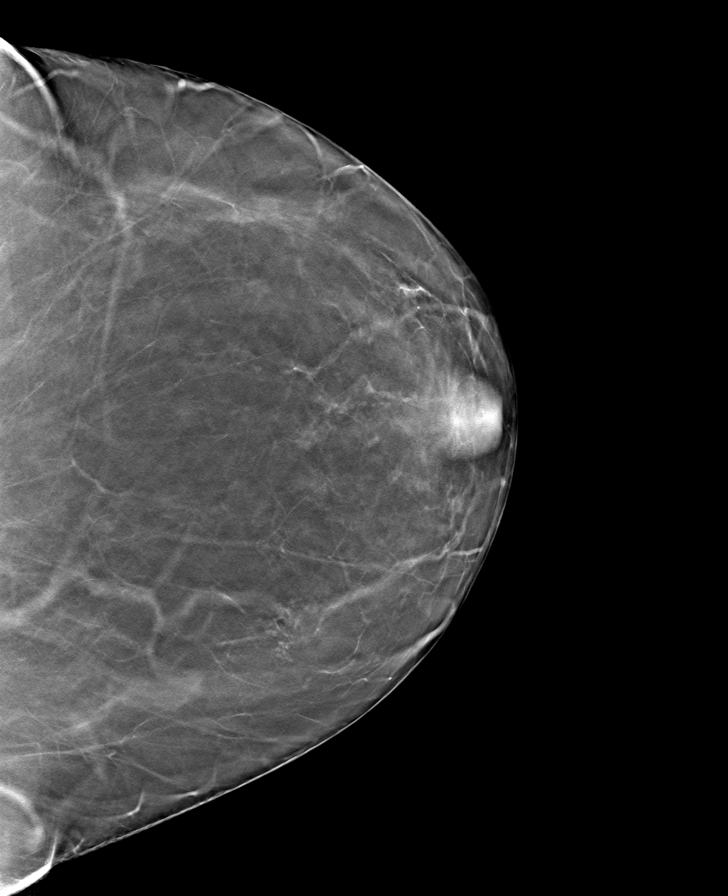

[8 of 40 positions shown; findings below may reference images not displayed]

ACR Breast Density Category b: There are scattered areas of
fibroglandular density.
FINDINGS: There are no findings suspicious for malignancy. Images were
processed with CAD.
IMPRESSION: No mammographic evidence of malignancy. A result letter of this
screening mammogram will be mailed directly to the patient.

RECOMMENDATION:
Screening mammogram in one year. (Code:[id number])

BI-RADS CATEGORY  1: Negative.

## 2021-01-29 ENCOUNTER — Other Ambulatory Visit (HOSPITAL_BASED_OUTPATIENT_CLINIC_OR_DEPARTMENT_OTHER): Payer: Self-pay

## 2021-02-01 ENCOUNTER — Other Ambulatory Visit (HOSPITAL_BASED_OUTPATIENT_CLINIC_OR_DEPARTMENT_OTHER): Payer: Self-pay

## 2021-03-08 ENCOUNTER — Other Ambulatory Visit (HOSPITAL_BASED_OUTPATIENT_CLINIC_OR_DEPARTMENT_OTHER): Payer: Self-pay

## 2021-03-23 DIAGNOSIS — G4733 Obstructive sleep apnea (adult) (pediatric): Secondary | ICD-10-CM | POA: Diagnosis not present

## 2021-03-26 ENCOUNTER — Ambulatory Visit (INDEPENDENT_AMBULATORY_CARE_PROVIDER_SITE_OTHER): Payer: BC Managed Care – PPO | Admitting: Family Medicine

## 2021-03-26 ENCOUNTER — Other Ambulatory Visit: Payer: Self-pay

## 2021-03-26 DIAGNOSIS — Z23 Encounter for immunization: Secondary | ICD-10-CM | POA: Diagnosis not present

## 2021-05-09 ENCOUNTER — Other Ambulatory Visit (HOSPITAL_COMMUNITY): Payer: Self-pay

## 2021-05-14 ENCOUNTER — Other Ambulatory Visit: Payer: Self-pay

## 2021-05-14 ENCOUNTER — Encounter: Payer: Self-pay | Admitting: Family Medicine

## 2021-05-14 ENCOUNTER — Ambulatory Visit: Payer: Managed Care, Other (non HMO) | Admitting: Family Medicine

## 2021-05-14 VITALS — BP 146/94 | HR 80 | Resp 18 | Ht 64.0 in | Wt 318.2 lb

## 2021-05-14 DIAGNOSIS — I10 Essential (primary) hypertension: Secondary | ICD-10-CM | POA: Diagnosis not present

## 2021-05-14 DIAGNOSIS — Z6841 Body Mass Index (BMI) 40.0 and over, adult: Secondary | ICD-10-CM | POA: Diagnosis not present

## 2021-05-14 DIAGNOSIS — F418 Other specified anxiety disorders: Secondary | ICD-10-CM | POA: Diagnosis not present

## 2021-05-14 DIAGNOSIS — Z23 Encounter for immunization: Secondary | ICD-10-CM | POA: Diagnosis not present

## 2021-05-14 MED ORDER — LOSARTAN POTASSIUM-HCTZ 100-12.5 MG PO TABS: 1.0000 | ORAL_TABLET | Freq: Every day | ORAL | 1 refills | Status: DC

## 2021-05-14 MED ORDER — BUPROPION HCL ER (XL) 150 MG PO TB24: 150.0000 mg | ORAL_TABLET | Freq: Every day | ORAL | 1 refills | Status: DC

## 2021-05-14 MED ORDER — AMLODIPINE BESYLATE 10 MG PO TABS: 10.0000 mg | ORAL_TABLET | Freq: Every day | ORAL | 1 refills | Status: DC

## 2021-05-14 NOTE — Progress Notes (Addendum)
Established Patient Office Visit  Subjective:  Patient ID: Stacey Castaneda, female    DOB: 29-Mar-1971  Age: 51 y.o. MRN: [id number]  CC:  Chief Complaint  Patient presents with   Hypertension    Follow up    Depression    Follow up. Patient states she feels the Zoloft is not helping     HPI Stacey Castaneda presents for   Hypertension- Pt denies chest pain, SOB, dizziness, or heart palpitations.  Taking meds as directed w/o problems.  Denies medication side effects.    Also feels like her sertraline is not working well for her.  Currently on 150 mg daily.  Feels like she just is dealing with a lot of stress and anxiety and then feels like that makes her feel more down in general.  She wants to discuss some other options.  She is not interested in therapy or counseling at least at this point. No panic attacks.  She is concerned that she is gaining weight.  She would like to start working on setting some goals nd moving forward.    Past Medical History:  Diagnosis Date   Anxiety    Depression    Hypertension    Perianal fistula    Perirectal abscess 10/2012   PONV (postoperative nausea and vomiting)    Poor venous access    01-26-14 "states having PICC line placed on 01-27-14"   Seasonal allergic rhinitis    Thyroid goiter    BENIGN- remains "tested negative"   Vitreous floaters of left eye    evaluated-not always an issue   Wears contact lenses     Past Surgical History:  Procedure Laterality Date   ANAL FISTULECTOMY N/A 01/28/2014   Procedure: LIGATION OF INTERNAL FISTULA TRACT, REMOVAL OF SETON ;  Surgeon: Leighton Ruff, MD;  Location: [facility];  Service: General;  Laterality: N/A;   DILATATION OF CERVIX/  REMOVAL AND REPLACEMENT MIRENA  10-22-2010   DILATION AND CURETTAGE OF UTERUS  1990's   EVALUATION UNDER ANESTHESIA WITH ANAL FISTULECTOMY N/A 09/03/2013   Procedure: EXAM UNDER ANESTHESIA ;  Surgeon: Leighton Ruff, MD;  Location: [facility];  Service: General;  Laterality:  N/A;   HYSTEROSCOPY WITH D & C  08-22-2005   INCISION AND DRAINAGE PERIRECTAL ABSCESS N/A 10/30/2012   Procedure: peri rectal INCISION AND DRAINAGE ABSCESS;  Surgeon: Earnstine Regal, MD;  Location: [facility];  Service: General;  Laterality: N/A;   PLACEMENT OF SETON N/A 09/03/2013   Procedure: PLACEMENT OF SETON;  Surgeon: Leighton Ruff, MD;  Location: [facility];  Service: General;  Laterality: N/A;   WISDOM TOOTH EXTRACTION  AGE 6    Family History  Problem Relation Age of Onset   Diabetes Father    Heart disease Father    Hypertension Father    Diabetes Mother    Hypertension Mother    Lung cancer Mother    Breast cancer Maternal Grandmother     Social History   Socioeconomic History   Marital status: Married    Spouse name: Not on file   Number of children: Not on file   Years of education: Not on file   Highest education level: Not on file  Occupational History   Occupation: Software Testor  Tobacco Use   Smoking status: Former    Packs/day: 1.50    Years: 10.00    Pack years: 15.00    Types: Cigarettes    Quit date: 10/31/2006    Years since quitting: 86.5  Smokeless tobacco: Never  Vaping Use   Vaping Use: Never used  Substance and Sexual Activity   Alcohol use: Yes    Comment: RARE   Drug use: No   Sexual activity: Yes    Birth control/protection: I.U.D.  Other Topics Concern   Not on file  Social History Narrative   Not on file   Social Determinants of Health   Financial Resource Strain: Not on file  Food Insecurity: Not on file  Transportation Needs: Not on file  Physical Activity: Not on file  Stress: Not on file  Social Connections: Not on file  Intimate Partner Violence: Not on file    Outpatient Medications Prior to Visit  Medication Sig Dispense Refill   albuterol (VENTOLIN HFA) 108 (90 Base) MCG/ACT inhaler Inhale 2 puffs into the lungs every 4 (four) hours as needed for wheezing or shortness of breath. 54 g 4   AMBULATORY NON FORMULARY  MEDICATION Medication Name: Please provide face mask, humidier, and all supplies. DX: [facility]  Fax: [phone] ([facility]) 1 Units PRN   cetirizine (ZYRTEC) 10 MG tablet Take 10 mg by mouth every morning.      docusate sodium (COLACE) 100 MG capsule Take 100 mg by mouth daily.     fluticasone (FLONASE) 50 MCG/ACT nasal spray Place 2 sprays into both nostrils daily. 16 g 6   fluticasone furoate-vilanterol (BREO ELLIPTA) 100-25 MCG/INH AEPB Inhale 1 puff into the lungs every morning. 180 each 3   levonorgestrel (MIRENA) 20 MCG/24HR IUD 1 each by Intrauterine route once.     sertraline (ZOLOFT) 100 MG tablet Take 1 & 1/2 tablets (150 mg total) by mouth daily. 135 tablet 1   amLODipine (NORVASC) 5 MG tablet TAKE 1 TABLET (5 MG TOTAL) BY MOUTH DAILY. 90 tablet 1   losartan-hydrochlorothiazide (HYZAAR) 100-12.5 MG tablet Take 1 tablet by mouth daily. 90 tablet 1   Multiple Vitamins-Minerals (MULTIVITAMIN GUMMIES ADULT PO) Take 2 each by mouth every morning. (Patient not taking: Reported on 05/14/2021)     No facility-administered medications prior to visit.    Allergies  Allergen Reactions   Lisinopril Cough    ROS Review of Systems    Objective:    Physical Exam Constitutional:      Appearance: Normal appearance. She is well-developed.  HENT:     Head: Normocephalic and atraumatic.  Cardiovascular:     Rate and Rhythm: Normal rate and regular rhythm.     Heart sounds: Normal heart sounds.  Pulmonary:     Effort: Pulmonary effort is normal.     Breath sounds: Normal breath sounds.  Skin:    General: Skin is warm and dry.  Neurological:     Mental Status: She is alert and oriented to person, place, and time.  Psychiatric:        Behavior: Behavior normal.    BP (!) 146/94 (BP Location: Left Arm)    Pulse 80    Resp 18    Ht '5\' 4"'  (1.626 m)    Wt (!) 318 lb 3.2 oz (144.3 kg)    SpO2 97%    BMI 54.62 kg/m  Wt Readings from Last 3 Encounters:  05/14/21 (!) 318 lb 3.2 oz (144.3 kg)   11/05/18 290 lb (131.5 kg)  03/20/18 267 lb (121.1 kg)     Health Maintenance Due  Topic Date Due   Hepatitis C Screening  Never done    There are no preventive care reminders to display for this patient.  Lab Results  Component Value Date   TSH 1.33 12/07/2020   Lab Results  Component Value Date   WBC 8.9 01/26/2014   HGB 14.1 01/26/2014   HCT 41.0 01/26/2014   MCV 85.4 01/26/2014   PLT 281 01/26/2014   Lab Results  Component Value Date   NA 138 12/07/2020   K 4.0 12/07/2020   CO2 24 12/07/2020   GLUCOSE 91 12/07/2020   BUN 7 12/07/2020   CREATININE 0.60 12/07/2020   BILITOT 0.4 12/07/2020   ALKPHOS 70 05/21/2016   AST 18 12/07/2020   ALT 20 12/07/2020   PROT 6.5 12/07/2020   ALBUMIN 3.9 05/21/2016   CALCIUM 8.6 12/07/2020   ANIONGAP 14 01/26/2014   EGFR 109 12/07/2020   Lab Results  Component Value Date   CHOL 169 12/07/2020   Lab Results  Component Value Date   HDL 66 12/07/2020   Lab Results  Component Value Date   LDLCALC 86 12/07/2020   Lab Results  Component Value Date   TRIG 78 12/07/2020   Lab Results  Component Value Date   CHOLHDL 2.6 12/07/2020   Lab Results  Component Value Date   HGBA1C 4.9 05/21/2016      Assessment & Plan:   Problem List Items Addressed This Visit       Cardiovascular and Mediastinum   Essential hypertension    Uncontrolled.  Crease amlodipine to 10 mg.  New prescription sent to pharmacy.      Relevant Medications   losartan-hydrochlorothiazide (HYZAAR) 100-12.5 MG tablet   amLODipine (NORVASC) 10 MG tablet     Other   Obesity, Class III, BMI 40-49.9 (morbid obesity) ([place])    Ahead and place referral to a more comprehensive bariatric program.  He is okay with going to [place].      Depression with anxiety    Discussed options.  Working to try going back down on the sertraline 200 mg and adding bupropion 150 mg XL.  One of her biggest issues right now is just focus and staying on task so organ  to see if we can get that better and see if that also helps with some of the depression and may [place] anxiety symptoms.  Declined therapy/counseling at this time.  If this is not helpful when we follow-up in 4 to 6 weeks and the plan will be to consider changing medications completely.      Relevant Medications   buPROPion (WELLBUTRIN XL) 150 MG 24 hr tablet   Other Visit Diagnoses     Need for pneumococcal vaccination    -  Primary   Relevant Orders   Pneumococcal conjugate vaccine 20-valent (Prevnar 20) (Completed)       Meds ordered this encounter  Medications   losartan-hydrochlorothiazide (HYZAAR) 100-12.5 MG tablet    Sig: Take 1 tablet by mouth daily.    Dispense:  90 tablet    Refill:  1   amLODipine (NORVASC) 10 MG tablet    Sig: Take 1 tablet (10 mg total) by mouth daily.    Dispense:  90 tablet    Refill:  1   buPROPion (WELLBUTRIN XL) 150 MG 24 hr tablet    Sig: Take 1 tablet (150 mg total) by mouth daily.    Dispense:  90 tablet    Refill:  1    Follow-up: Return in about 6 weeks (around 06/25/2021) for Hypertension/Medication Follow up.    Beatrice Lecher, MD

## 2021-05-14 NOTE — Assessment & Plan Note (Signed)
Discussed options.  Working to try going back down on the sertraline 200 mg and adding bupropion 150 mg XL.  One of her biggest issues right now is just focus and staying on task so organ to see if we can get that better and see if that also helps with some of the depression and may [place] anxiety symptoms.  Declined therapy/counseling at this time.  If this is not helpful when we follow-up in 4 to 6 weeks and the plan will be to consider changing medications completely.

## 2021-05-14 NOTE — Assessment & Plan Note (Addendum)
Uncontrolled.  Crease amlodipine to 10 mg.  New prescription sent to pharmacy.

## 2021-05-14 NOTE — Assessment & Plan Note (Signed)
Ahead and place referral to a more comprehensive bariatric program.  He is okay with going to [place].

## 2021-06-19 ENCOUNTER — Other Ambulatory Visit: Payer: Self-pay | Admitting: Family Medicine

## 2021-06-19 DIAGNOSIS — F418 Other specified anxiety disorders: Secondary | ICD-10-CM

## 2021-06-20 MED ORDER — SERTRALINE HCL 100 MG PO TABS: 150.0000 mg | ORAL_TABLET | Freq: Every day | ORAL | 1 refills | Status: DC

## 2021-06-29 ENCOUNTER — Other Ambulatory Visit: Payer: Self-pay

## 2021-06-29 ENCOUNTER — Ambulatory Visit: Payer: Managed Care, Other (non HMO) | Admitting: Family Medicine

## 2021-06-29 ENCOUNTER — Encounter: Payer: Self-pay | Admitting: Family Medicine

## 2021-06-29 VITALS — BP 147/82 | HR 91 | Resp 20 | Ht 64.0 in | Wt 318.9 lb

## 2021-06-29 DIAGNOSIS — F418 Other specified anxiety disorders: Secondary | ICD-10-CM | POA: Diagnosis not present

## 2021-06-29 DIAGNOSIS — J4540 Moderate persistent asthma, uncomplicated: Secondary | ICD-10-CM | POA: Diagnosis not present

## 2021-06-29 DIAGNOSIS — I10 Essential (primary) hypertension: Secondary | ICD-10-CM

## 2021-06-29 DIAGNOSIS — Z1159 Encounter for screening for other viral diseases: Secondary | ICD-10-CM | POA: Diagnosis not present

## 2021-06-29 MED ORDER — SERTRALINE HCL 100 MG PO TABS: 100.0000 mg | ORAL_TABLET | Freq: Every day | ORAL | 1 refills | Status: DC

## 2021-06-29 MED ORDER — OLMESARTAN-AMLODIPINE-HCTZ 40-10-25 MG PO TABS
1.0000 | ORAL_TABLET | Freq: Every day | ORAL | 3 refills | Status: DC
Start: 2021-06-29 — End: 2022-08-06

## 2021-06-29 MED ORDER — SPIRONOLACTONE 25 MG PO TABS: 25.0000 mg | ORAL_TABLET | Freq: Every day | ORAL | 1 refills | Status: DC

## 2021-06-29 NOTE — Assessment & Plan Note (Signed)
She still having some symptoms in part because work has been incredibly stressful.  But she is happy with the stepdown on the sertraline and wants to stay to 100 mg.  Continue Wellbutrin as well.

## 2021-06-29 NOTE — Assessment & Plan Note (Signed)
Not optimally controlled.  We will combine medications into Tribenzor and then add spironolactone for better blood pressure control.

## 2021-06-29 NOTE — Progress Notes (Signed)
Established Patient Office Visit  Subjective:  Patient ID: Stacey Castaneda, female    DOB: [birth year]  Age: 51 y.o. MRN: [id number]  CC:  Chief Complaint  Patient presents with   Hypertension    Follow up    Medication Follow up     Patient currently taking 1 tablet of Wellbutrin and Lexapro. Patient stated she is feeling much better.     HPI Stacey Castaneda presents for   Hypertension- Pt denies chest pain, SOB, dizziness, or heart palpitations.  Taking meds as directed w/o problems.  Denies medication side effects.    Active airways disease-overall she has been doing okay in fact she feels like maybe things are little better.  Place referral for weight management when I last saw her but unfortunately it is a 76-monthwait list.  But she did go ahead and put her name on the wait list.   Past Medical History:  Diagnosis Date   Anxiety    Depression    Hypertension    Perianal fistula    Perirectal abscess 10/2012   PONV (postoperative nausea and vomiting)    Poor venous access    01-26-14 "states having PICC line placed on 01-27-14"   Seasonal allergic rhinitis    Thyroid goiter    BENIGN- remains "tested negative"   Vitreous floaters of left eye    evaluated-not always an issue   Wears contact lenses     Past Surgical History:  Procedure Laterality Date   ANAL FISTULECTOMY N/A 01/28/2014   Procedure: LIGATION OF INTERNAL FISTULA TRACT, REMOVAL OF SETON ;  Surgeon: ALeighton Ruff MD;  Location: [facility];  Service: General;  Laterality: N/A;   DILATATION OF CERVIX/  REMOVAL AND REPLACEMENT MIRENA  10-22-2010   DILATION AND CURETTAGE OF UTERUS  1990's   EVALUATION UNDER ANESTHESIA WITH ANAL FISTULECTOMY N/A 09/03/2013   Procedure: EXAM UNDER ANESTHESIA ;  Surgeon: ALeighton Ruff MD;  Location: [facility];  Service: General;  Laterality: N/A;   HYSTEROSCOPY WITH D & C  08-22-2005   INCISION AND DRAINAGE PERIRECTAL ABSCESS N/A 10/30/2012   Procedure: peri rectal INCISION AND  DRAINAGE ABSCESS;  Surgeon: TEarnstine Regal MD;  Location: [facility];  Service: General;  Laterality: N/A;   PLACEMENT OF SETON N/A 09/03/2013   Procedure: PLACEMENT OF SETON;  Surgeon: ALeighton Ruff MD;  Location: [facility];  Service: General;  Laterality: N/A;   WISDOM TOOTH EXTRACTION  AGE 89   Family History  Problem Relation Age of Onset   Diabetes Father    Heart disease Father    Hypertension Father    Diabetes Mother    Hypertension Mother    Lung cancer Mother    Breast cancer Maternal Grandmother     Social History   Socioeconomic History   Marital status: Married    Spouse name: Not on file   Number of children: Not on file   Years of education: Not on file   Highest education level: Not on file  Occupational History   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  Other Topics Concern   Not on file  Social History Narrative   Not on file   Social Determinants of Health   Financial Resource Strain: Not on file  Food Insecurity: Not on file  Transportation Needs: Not on file  Physical Activity: Not on file  Stress: Not on file  Social Connections: Not on file  Intimate Partner Violence: Not on file    Outpatient Medications Prior to Visit  Medication Sig Dispense Refill   albuterol (VENTOLIN HFA) 108 (90 Base) MCG/ACT inhaler Inhale 2 puffs into the lungs every 4 (four) hours as needed for wheezing or shortness of breath. 54 g 4   buPROPion (WELLBUTRIN XL) 150 MG 24 hr tablet Take 1 tablet (150 mg total) by mouth daily. 90 tablet 1   cetirizine (ZYRTEC) 10 MG tablet Take 10 mg by mouth every morning.       docusate sodium (COLACE) 100 MG capsule Take 100 mg by mouth daily.     fluticasone (FLONASE) 50 MCG/ACT nasal spray Place 2 sprays into both nostrils daily. 16 g 6   fluticasone furoate-vilanterol (BREO ELLIPTA) 100-25 MCG/INH AEPB Inhale 1 puff into the lungs every morning. 180 each 3   levonorgestrel (MIRENA) 20 MCG/24HR IUD 1 each by Intrauterine route once.     AMBULATORY NON FORMULARY MEDICATION Medication Name: Please provide face mask, humidier, and all supplies. DX: [facility]  Fax: [phone] ([facility]) 1 Units PRN   amLODipine (NORVASC) 10 MG tablet Take 1 tablet (10 mg total) by mouth daily. 90 tablet 1   losartan-hydrochlorothiazide (HYZAAR) 100-12.5 MG tablet Take 1 tablet by mouth daily. 90 tablet 1   sertraline (ZOLOFT) 100 MG tablet Take 1 & 1/2 tablets (150 mg total) by mouth daily. (Patient taking differently: Take 150 mg by mouth daily. Patient taking 1 tablet daily.) 135 tablet 1   Multiple Vitamins-Minerals (MULTIVITAMIN GUMMIES ADULT PO) Take 2 each by mouth every morning. (Patient not taking: Reported on 05/14/2021)     No facility-administered medications prior to visit.    Allergies  Allergen Reactions   Lisinopril Cough    ROS Review of Systems    Objective:    Physical Exam Constitutional:      Appearance: Normal appearance. She is well-developed.  HENT:     Head: Normocephalic and atraumatic.  Cardiovascular:     Rate and Rhythm: Normal rate and regular rhythm.     Heart sounds: Normal heart sounds.  Pulmonary:     Effort: Pulmonary effort is normal.     Breath sounds: Normal breath sounds.  Skin:    General: Skin is warm and dry.  Neurological:     Mental Status: She is alert and oriented to person, place, and time.  Psychiatric:        Behavior: Behavior normal.   BP (!) 147/82    Pulse 91    Resp 20    Ht 5' 4" (1.626 m)    Wt (!) 318 lb 14.4 oz (144.7 kg)    SpO2 97%    BMI 54.74 kg/m  Wt Readings from Last 3 Encounters:  06/29/21 (!) 318  lb 14.4 oz (144.7 kg)  05/14/21 (!) 318 lb 3.2 oz (144.3 kg)  11/05/18 290 lb (131.5 kg)     Health Maintenance Due  Topic Date Due   Hepatitis C Screening  Never done    There are no preventive care reminders to display  for this patient.  Lab Results  Component Value Date   TSH 1.33 12/07/2020   Lab Results  Component Value Date   WBC 8.9 01/26/2014   HGB 14.1 01/26/2014   HCT 41.0 01/26/2014   MCV 85.4 01/26/2014   PLT 281 01/26/2014   Lab Results  Component Value Date   NA 138 12/07/2020   K 4.0 12/07/2020   CO2 24 12/07/2020   GLUCOSE 91 12/07/2020   BUN 7 12/07/2020   CREATININE 0.60 12/07/2020   BILITOT 0.4 12/07/2020   ALKPHOS 70 05/21/2016   AST 18 12/07/2020   ALT 20 12/07/2020   PROT 6.5 12/07/2020   ALBUMIN 3.9 05/21/2016   CALCIUM 8.6 12/07/2020   ANIONGAP 14 01/26/2014   EGFR 109 12/07/2020   Lab Results  Component Value Date   CHOL 169 12/07/2020   Lab Results  Component Value Date   HDL 66 12/07/2020   Lab Results  Component Value Date   LDLCALC 86 12/07/2020   Lab Results  Component Value Date   TRIG 78 12/07/2020   Lab Results  Component Value Date   CHOLHDL 2.6 12/07/2020   Lab Results  Component Value Date   HGBA1C 4.9 05/21/2016      Assessment & Plan:   Problem List Items Addressed This Visit       Cardiovascular and Mediastinum   Essential hypertension - Primary    Not optimally controlled.  We will combine medications into Tribenzor and then add spironolactone for better blood pressure control.      Relevant Medications   Olmesartan-amLODIPine-HCTZ 40-10-25 MG TABS   spironolactone (ALDACTONE) 25 MG tablet   Other Relevant Orders   BASIC METABOLIC PANEL WITH GFR     Respiratory   Reactive airway disease    Stable on current regimen.        Other   Depression with anxiety    She still having some symptoms in part because work has been incredibly stressful.  But she is happy with the stepdown on the  sertraline and wants to stay to 100 mg.  Continue Wellbutrin as well.      Relevant Medications   sertraline (ZOLOFT) 100 MG tablet   Other Visit Diagnoses     Need for hepatitis C screening test       Relevant Orders   Hepatitis C Antibody       Meds ordered this encounter  Medications   Olmesartan-amLODIPine-HCTZ 40-10-25 MG TABS    Sig: Take 1 tablet by mouth daily.    Dispense:  90 tablet    Refill:  3   spironolactone (ALDACTONE) 25 MG tablet    Sig: Take 1 tablet (25 mg total) by mouth daily.    Dispense:  90 tablet    Refill:  1   sertraline (ZOLOFT) 100 MG tablet    Sig: Take 1 tablet (100 mg total) by mouth daily.    Dispense:  90 tablet    Refill:  1    Follow-up: Return in about 3 months (around 09/26/2021) for Hypertension.    Stacey Lecher, MD

## 2021-06-29 NOTE — Assessment & Plan Note (Signed)
Stable on current regimen   

## 2021-06-30 NOTE — Progress Notes (Signed)
Your lab work is within acceptable range and there are no concerning findings.   ?

## 2021-07-02 LAB — BASIC METABOLIC PANEL WITH GFR
BUN: 14 mg/dL (ref 7–25)
CO2: 25 mmol/L (ref 20–32)
Calcium: 8.9 mg/dL (ref 8.6–10.4)
Chloride: 104 mmol/L (ref 98–110)
Creat: 0.65 mg/dL (ref 0.50–1.03)
Glucose, Bld: 82 mg/dL (ref 65–99)
Potassium: 4 mmol/L (ref 3.5–5.3)
Sodium: 137 mmol/L (ref 135–146)
eGFR: 107 mL/min/{1.73_m2} (ref >=60)

## 2021-07-02 LAB — HEPATITIS C ANTIBODY
Hepatitis C Ab: NONREACTIVE
SIGNAL TO CUT-OFF: 0.05 (ref <1.00)

## 2021-07-03 NOTE — Progress Notes (Signed)
You are negative for hepatitis C.  We just screen once in your lifetime.

## 2021-07-26 ENCOUNTER — Encounter: Payer: Self-pay | Admitting: Family Medicine

## 2021-07-26 DIAGNOSIS — G4733 Obstructive sleep apnea (adult) (pediatric): Secondary | ICD-10-CM

## 2021-07-30 NOTE — Telephone Encounter (Signed)
It can be set ti 13 cm water pressure.  We can order a new CPAP machine and supples ?

## 2021-08-07 ENCOUNTER — Other Ambulatory Visit: Payer: Self-pay

## 2021-08-07 DIAGNOSIS — G4733 Obstructive sleep apnea (adult) (pediatric): Secondary | ICD-10-CM

## 2021-08-08 NOTE — Telephone Encounter (Signed)
This has been taken care of by Salvadore Oxford. CMA ?

## 2021-09-21 ENCOUNTER — Other Ambulatory Visit (HOSPITAL_BASED_OUTPATIENT_CLINIC_OR_DEPARTMENT_OTHER): Payer: Self-pay

## 2021-09-21 ENCOUNTER — Encounter: Payer: Self-pay | Admitting: Family Medicine

## 2021-09-21 ENCOUNTER — Ambulatory Visit: Payer: Managed Care, Other (non HMO) | Admitting: Family Medicine

## 2021-09-21 VITALS — BP 125/64 | HR 84 | Resp 16 | Ht 64.0 in | Wt 316.0 lb

## 2021-09-21 DIAGNOSIS — F418 Other specified anxiety disorders: Secondary | ICD-10-CM

## 2021-09-21 DIAGNOSIS — I10 Essential (primary) hypertension: Secondary | ICD-10-CM | POA: Diagnosis not present

## 2021-09-21 DIAGNOSIS — M549 Dorsalgia, unspecified: Secondary | ICD-10-CM

## 2021-09-21 MED ORDER — GABAPENTIN 100 MG PO CAPS
100.0000 mg | ORAL_CAPSULE | Freq: Three times a day (TID) | ORAL | 0 refills | Status: DC | PRN
  Filled 2021-09-21: qty 90, 30d supply, fill #0

## 2021-09-21 MED ORDER — CYCLOBENZAPRINE HCL 10 MG PO TABS
10.0000 mg | ORAL_TABLET | Freq: Three times a day (TID) | ORAL | 0 refills | Status: DC | PRN
  Filled 2021-09-21: qty 30, 10d supply, fill #0

## 2021-09-21 MED ORDER — VALACYCLOVIR HCL 1 G PO TABS
1000.0000 mg | ORAL_TABLET | Freq: Three times a day (TID) | ORAL | 0 refills | Status: DC
  Filled 2021-09-21: qty 21, 7d supply, fill #0

## 2021-09-21 NOTE — Assessment & Plan Note (Signed)
Well controlled. Continue current regimen. Follow up in  6 mo  

## 2021-09-21 NOTE — Assessment & Plan Note (Signed)
Is really doing well and happy with her current regimen.  So plan to follow back up in 6 months.  Call if any concerns or problems in the interim.

## 2021-09-21 NOTE — Progress Notes (Signed)
Established Patient Office Visit  Subjective   Patient ID: Stacey Castaneda, female    DOB: 1971/03/05  Age: 51 y.o. MRN: [id number]  Chief Complaint  Patient presents with   Hypertension    Follow up    Shoulder Pain    Patient complains of pain in shoulders and right side of neck. Sharp/throbbing pain. Pain radiates down back 6 days.     HPI   Hypertension- Pt denies chest pain, SOB, dizziness, or heart palpitations.  Taking meds as directed w/o problems.  Denies medication side effects.    F/U depression and Anxiety - on zoloft and wellbutrin.  Does feel like the Wellbutrin is been helpful in helping her feel little bit more motivated.  She has not had any side effects or concerns.  Patient complains of pain in shoulders and right side of neck. Sharp/throbbing pain. Pain radiates down back 6 days. Burning sensation.  She says her husband noticed what looked like may be some small bruises in that area.  She has been mostly using heat which does help temporarily.  She tried ice but it could not quite get the ice pad in the right place.  She is also been using some ibuprofen.  She says it just feels like a deeper throb with occasionally more sharp burning type sensation.    ROS    Objective:     BP 125/64   Pulse 84   Resp 16   Ht '5\' 4"'$  (1.626 m)   Wt (!) 316 lb (143.3 kg)   SpO2 95%   BMI 54.24 kg/m    Physical Exam Vitals and nursing note reviewed.  Constitutional:      Appearance: She is well-developed.  HENT:     Head: Normocephalic and atraumatic.  Cardiovascular:     Rate and Rhythm: Normal rate and regular rhythm.     Heart sounds: Normal heart sounds.  Pulmonary:     Effort: Pulmonary effort is normal.     Breath sounds: Normal breath sounds.  Skin:    General: Skin is warm and dry.  Neurological:     Mental Status: She is alert and oriented to person, place, and time.  Psychiatric:        Behavior: Behavior normal.       No results found for  any visits on 09/21/21.    The 10-year ASCVD risk score (Arnett DK, et al., 2019) is: 1.1%    Assessment & Plan:   Problem List Items Addressed This Visit       Cardiovascular and Mediastinum   Essential hypertension - Primary    Well controlled. Continue current regimen. Follow up in  6 mo          Other   Depression with anxiety    Is really doing well and happy with her current regimen.  So plan to follow back up in 6 months.  Call if any concerns or problems in the interim.       Other Visit Diagnoses     Upper back pain on right side       Relevant Medications   gabapentin (NEURONTIN) 100 MG capsule   cyclobenzaprine (FLEXERIL) 10 MG tablet       Right upper back pain-interestingly she does have a macular rash in that area in a lateral distribution almost like shingles except that she does not have any discrete raised or papular lesions.  So I am a little suspicious for shingles.  We will  get a treat with gabapentin for pain as well as a muscle relaxer which she has used for musculoskeletal issues in the past and finds that it helps her.  I am also going to cover her with valacyclovir as well.  Return in about 6 months (around 03/24/2022) for Hypertension.    Beatrice Lecher, MD

## 2021-10-02 ENCOUNTER — Encounter: Payer: Self-pay | Admitting: Family Medicine

## 2021-10-03 ENCOUNTER — Encounter: Payer: Self-pay | Admitting: Family Medicine

## 2021-10-08 NOTE — Telephone Encounter (Signed)
Okay to just take the Tribenzor that has all 3 and 1 and stop the separate amlodipine.  And then also take the spironolactone.  She can discontinue the separate losartan HCT.

## 2021-10-11 ENCOUNTER — Telehealth: Payer: Managed Care, Other (non HMO) | Admitting: Family Medicine

## 2021-10-11 DIAGNOSIS — J4540 Moderate persistent asthma, uncomplicated: Secondary | ICD-10-CM | POA: Diagnosis not present

## 2021-10-11 DIAGNOSIS — J069 Acute upper respiratory infection, unspecified: Secondary | ICD-10-CM | POA: Diagnosis not present

## 2021-10-11 MED ORDER — PROMETHAZINE-DM 6.25-15 MG/5ML PO SYRP: 5.0000 mL | ORAL_SOLUTION | Freq: Four times a day (QID) | ORAL | 0 refills | Status: DC | PRN

## 2021-10-11 NOTE — Progress Notes (Signed)
Virtual Visit Consent   Stacey Castaneda, you are scheduled for a virtual visit with a [facility] provider today. Just as with appointments in the office, your consent must be obtained to participate. Your consent will be active for this visit and any virtual visit you may have with one of our providers in the next 365 days. If you have a MyChart account, a copy of this consent can be sent to you electronically.  As this is a virtual visit, video technology does not allow for your provider to perform a traditional examination. This may limit your provider's ability to fully assess your condition. If your provider identifies any concerns that need to be evaluated in person or the need to arrange testing (such as labs, EKG, etc.), we will make arrangements to do so. Although advances in technology are sophisticated, we cannot ensure that it will always work on either your end or our end. If the connection with a video visit is poor, the visit may have to be switched to a telephone visit. With either a video or telephone visit, we are not always able to ensure that we have a secure connection.  By engaging in this virtual visit, you consent to the provision of healthcare and authorize for your insurance to be billed (if applicable) for the services provided during this visit. Depending on your insurance coverage, you may receive a charge related to this service.  I need to obtain your verbal consent now. Are you willing to proceed with your visit today? Stacey Castaneda has provided verbal consent on 10/11/2021 for a virtual visit (video or telephone). Stacey [name], NP  Date: 10/11/2021 12:18 PM  Virtual Visit via Video Note   I, Stacey Castaneda, connected with  Stacey Castaneda  ([id number], [birth year]) on 10/11/21 at 12:15 PM EDT by a video-enabled telemedicine application and verified that I am speaking with the correct person using two identifiers.  Location: Patient: Virtual Visit Location  Patient: Home Provider: Virtual Visit Location Provider: Home Office   I discussed the limitations of evaluation and management by telemedicine and the availability of in person appointments. The patient expressed understanding and agreed to proceed.    History of Present Illness: Stacey Castaneda is a 51 y.o. who identifies as a female who was assigned female at birth, and is being seen today for sinus congestion and pressure. Treated for allergy and OTC sinus meds. Continues to get worse. Sore throat has improved some.   HPI: Sinusitis This is a new problem. The current episode started in the past 7 days. The problem has been gradually worsening since onset. There has been no fever. Associated symptoms include chills, congestion, coughing, ear pain, headaches, a hoarse voice, sinus pressure, sneezing, a sore throat and swollen glands. Pertinent negatives include no diaphoresis, neck pain or shortness of breath. Past treatments include oral decongestants and nasal decongestants. The treatment provided mild relief.    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  Obesity, Class III, BMI 40-49.9 (morbid obesity) ([place]) 02/07/2012   Hirsutism 11/29/2010   Anxiety state 02/22/2010   Depression with anxiety 02/22/2010   Essential hypertension 02/22/2010    Allergies:  Allergies  Allergen Reactions   Lisinopril Cough   Medications:  Current Outpatient Medications:    albuterol  (VENTOLIN HFA) 108 (90 Base) MCG/ACT inhaler, Inhale 2 puffs into the lungs every 4 (four) hours as needed for wheezing or shortness of breath., Disp: 54 g, Rfl: 4   buPROPion (WELLBUTRIN XL) 150 MG 24 hr tablet, Take 1 tablet (150 mg total) by mouth daily., Disp: 90 tablet, Rfl: 1   cetirizine (ZYRTEC) 10 MG tablet, Take 10 mg by mouth every morning. , Disp: , Rfl:    cyclobenzaprine (FLEXERIL) 10 MG tablet, Take 1 tablet (10 mg total) by mouth 3 (three) times daily as needed for muscle spasms., Disp: 30 tablet, Rfl: 0   docusate sodium (COLACE) 100 MG capsule, Take 100 mg by mouth daily., Disp: , Rfl:    fluticasone (FLONASE) 50 MCG/ACT nasal spray, Place 2 sprays into both nostrils daily., Disp: 16 g, Rfl: 6   fluticasone furoate-vilanterol (BREO ELLIPTA) 100-25 MCG/INH AEPB, Inhale 1 puff into the lungs every morning., Disp: 180 each, Rfl: 3   gabapentin (NEURONTIN) 100 MG capsule, Take 1 capsule (100 mg total) by mouth 3 (three) times daily as needed., Disp: 90 capsule, Rfl: 0   levonorgestrel (MIRENA) 20 MCG/24HR IUD, 1 each by Intrauterine route once., Disp: , Rfl:    Olmesartan-amLODIPine-HCTZ 40-10-25 MG TABS, Take 1 tablet by mouth daily., Disp: 90 tablet, Rfl: 3   sertraline (ZOLOFT) 100 MG tablet, Take 1 tablet (100 mg total) by mouth daily., Disp: 90 tablet, Rfl: 1   spironolactone (ALDACTONE) 25 MG tablet, Take 1 tablet (25 mg total) by mouth daily., Disp: 90 tablet, Rfl: 1  Observations/Objective: Patient is well-developed, well-nourished in no acute distress.  Resting comfortably  at home.  Head is normocephalic, atraumatic.  No labored breathing.  Speech is clear and coherent with logical content.  Patient is alert and oriented at baseline.  Nasal tone, hoarseness and cough present   Assessment and Plan:  1. URI with cough and congestion  - promethazine-dextromethorphan (PROMETHAZINE-DM) 6.25-15 MG/5ML syrup; Take 5 mLs by mouth 4 (four) times daily as needed for cough.   Dispense: 118 mL; Refill: 0   Continue hydration, rest and OTC meds as discussed.  Start Promethazine as directed side effects reviewed If not improved in 48-72 hours please contact us back and we can look at need for anbx vs prednisone if asthma is flaring up. Stay inside and avoid smoke from wildfires.    Reviewed side effects, risks and benefits of medication.    Patient acknowledged agreement and understanding of the plan.     Follow Up Instructions: I discussed the assessment and treatment plan with the patient. The patient was provided an opportunity to ask questions and all were answered. The patient agreed with the plan and demonstrated an understanding of the instructions.  A copy of instructions were sent to the patient via MyChart unless otherwise noted below.     The patient was advised to call back or seek an in-person evaluation if the symptoms worsen or if the condition fails to improve as anticipated.  Time:  I spent 11 minutes with the patient via telehealth technology discussing the above problems/concerns.    Stacey [name], NP

## 2021-10-11 NOTE — Patient Instructions (Signed)

## 2021-11-18 ENCOUNTER — Other Ambulatory Visit: Payer: Self-pay | Admitting: Family Medicine

## 2021-11-18 DIAGNOSIS — F418 Other specified anxiety disorders: Secondary | ICD-10-CM

## 2021-11-19 DIAGNOSIS — Z0289 Encounter for other administrative examinations: Secondary | ICD-10-CM

## 2021-12-03 ENCOUNTER — Ambulatory Visit (INDEPENDENT_AMBULATORY_CARE_PROVIDER_SITE_OTHER): Payer: Managed Care, Other (non HMO) | Admitting: Family Medicine

## 2021-12-03 ENCOUNTER — Encounter (INDEPENDENT_AMBULATORY_CARE_PROVIDER_SITE_OTHER): Payer: Self-pay | Admitting: Family Medicine

## 2021-12-03 VITALS — BP 138/84 | HR 79 | Temp 98.3°F | Ht 64.0 in | Wt 312.0 lb

## 2021-12-03 DIAGNOSIS — I10 Essential (primary) hypertension: Secondary | ICD-10-CM

## 2021-12-03 DIAGNOSIS — Z6841 Body Mass Index (BMI) 40.0 and over, adult: Secondary | ICD-10-CM

## 2021-12-03 DIAGNOSIS — F418 Other specified anxiety disorders: Secondary | ICD-10-CM | POA: Diagnosis not present

## 2021-12-03 DIAGNOSIS — Z9989 Dependence on other enabling machines and devices: Secondary | ICD-10-CM

## 2021-12-03 DIAGNOSIS — G4733 Obstructive sleep apnea (adult) (pediatric): Secondary | ICD-10-CM | POA: Diagnosis not present

## 2021-12-03 DIAGNOSIS — R0602 Shortness of breath: Secondary | ICD-10-CM

## 2021-12-03 DIAGNOSIS — R5383 Other fatigue: Secondary | ICD-10-CM

## 2021-12-03 DIAGNOSIS — E669 Obesity, unspecified: Secondary | ICD-10-CM | POA: Diagnosis not present

## 2021-12-04 ENCOUNTER — Ambulatory Visit (INDEPENDENT_AMBULATORY_CARE_PROVIDER_SITE_OTHER): Payer: Self-pay | Admitting: Bariatrics

## 2021-12-06 ENCOUNTER — Encounter (INDEPENDENT_AMBULATORY_CARE_PROVIDER_SITE_OTHER): Payer: Self-pay | Admitting: Family Medicine

## 2021-12-06 DIAGNOSIS — E559 Vitamin D deficiency, unspecified: Secondary | ICD-10-CM | POA: Insufficient documentation

## 2021-12-08 LAB — CBC
Hematocrit: 38.6 % (ref 34.0–46.6)
Hemoglobin: 12.8 g/dL (ref 11.1–15.9)
MCH: 29.3 pg (ref 26.6–33.0)
MCHC: 33.2 g/dL (ref 31.5–35.7)
MCV: 88 fL (ref 79–97)
Platelets: 282 10*3/uL (ref 150–450)
RBC: 4.37 x10E6/uL (ref 3.77–5.28)
RDW: 12.7 % (ref 11.7–15.4)
WBC: 9 10*3/uL (ref 3.4–10.8)

## 2021-12-08 LAB — COMPREHENSIVE METABOLIC PANEL
ALT: 19 IU/L (ref 0–32)
AST: 14 IU/L (ref 0–40)
Albumin/Globulin Ratio: 1.4 (ref 1.2–2.2)
Albumin: 4.2 g/dL (ref 3.8–4.9)
Alkaline Phosphatase: 83 IU/L (ref 44–121)
BUN/Creatinine Ratio: 17 (ref 9–23)
BUN: 12 mg/dL (ref 6–24)
Bilirubin Total: 0.2 mg/dL (ref 0.0–1.2)
CO2: 21 mmol/L (ref 20–29)
Calcium: 10 mg/dL (ref 8.7–10.2)
Chloride: 102 mmol/L (ref 96–106)
Creatinine, Ser: 0.7 mg/dL (ref 0.57–1.00)
Globulin, Total: 2.9 g/dL (ref 1.5–4.5)
Glucose: 105 mg/dL — ABNORMAL HIGH (ref 70–99)
Potassium: 4.5 mmol/L (ref 3.5–5.2)
Sodium: 138 mmol/L (ref 134–144)
Total Protein: 7.1 g/dL (ref 6.0–8.5)
eGFR: 105 mL/min/{1.73_m2} (ref >59)

## 2021-12-08 LAB — TSH+FREE T4
Free T4: 1.11 ng/dL (ref 0.82–1.77)
TSH: 1.07 u[IU]/mL (ref 0.450–4.500)

## 2021-12-08 LAB — HEMOGLOBIN A1C
Est. average glucose Bld gHb Est-mCnc: 114 mg/dL
Hgb A1c MFr Bld: 5.6 % (ref 4.8–5.6)

## 2021-12-08 LAB — LIPID PANEL
Chol/HDL Ratio: 3.6 ratio (ref 0.0–4.4)
Cholesterol, Total: 189 mg/dL (ref 100–199)
HDL: 53 mg/dL (ref >39)
LDL Chol Calc (NIH): 119 mg/dL — ABNORMAL HIGH (ref 0–99)
Triglycerides: 96 mg/dL (ref 0–149)
VLDL Cholesterol Cal: 17 mg/dL (ref 5–40)

## 2021-12-08 LAB — VITAMIN D 25 HYDROXY (VIT D DEFICIENCY, FRACTURES): Vit D, 25-Hydroxy: 13.9 ng/mL — ABNORMAL LOW (ref 30.0–100.0)

## 2021-12-08 LAB — VITAMIN B12: Vitamin B-12: 417 pg/mL (ref 232–1245)

## 2021-12-08 LAB — INSULIN, FREE AND TOTAL
Free Insulin: 29 uU/mL — ABNORMAL HIGH
Total Insulin: 29 uU/mL

## 2021-12-11 NOTE — Progress Notes (Unsigned)
Chief Complaint:   OBESITY Stacey Castaneda (MR# [id number]) is a 51 y.o. female who presents for evaluation and treatment of obesity and related comorbidities. Current BMI is Body mass index is 53.55 kg/m. Stacey Castaneda has been struggling with her weight for many years and has been unsuccessful in either losing weight, maintaining weight loss, or reaching her healthy weight goal.  Stacey Castaneda would like to lose 100 pounds.  Has been overweight since childhood.  Has a family history of obesity.  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  Married with no children.  Weight increased and decreased through the years.  Has not considered weight loss surgery.  Eats out 20 times per week.  Avoids sugar sweetened beverages.  In 2019, lost down to 240 pounds with weight watchers.  Has never used antiobesity medications.  Admits to poor food choices, lack of support at home, no regular exercise, and long history of emotional eating.  Stacey Castaneda is currently in the action stage of change and ready to dedicate time achieving and maintaining a healthier weight. Stacey Castaneda is interested in becoming our patient and working on intensive lifestyle modifications including (but not limited to) diet and exercise for weight loss.  Stacey Castaneda's habits were reviewed today and are as follows: she struggles with family and or coworkers weight loss sabotage, her desired weight loss is 152-162 lbs, she has been heavy most of her life, her heaviest weight ever was 317 pounds, she has significant food cravings issues, she snacks frequently in the evenings, she skips meals frequently, she is frequently drinking liquids with calories, she frequently makes poor food choices, she frequently eats larger portions than normal, and she struggles with emotional eating.  Depression Screen Stacey Castaneda's Food and Mood (modified PHQ-9) score was 20.     12/03/2021    8:58 AM  Depression screen PHQ 2/9  Decreased Interest 3  Down, Depressed, Hopeless 2   PHQ - 2 Score 5  Altered sleeping 3  Tired, decreased energy 3  Change in appetite 2  Feeling bad or failure about yourself  3  Trouble concentrating 2  Moving slowly or fidgety/restless 2  Suicidal thoughts 0  PHQ-9 Score 20  Difficult doing work/chores Somewhat difficult   Subjective:   1. Other fatigue Stacey Castaneda admits to daytime somnolence and admits to waking up still tired. Patient has a history of symptoms of daytime fatigue and morning fatigue. Stacey Castaneda generally gets 4 or 6 hours of sleep per night, and states that she has nightime awakenings. Snoring is present. Apneic episodes are present. Epworth Sleepiness Score is 7.   2. SOB (shortness of breath) Stacey Castaneda notes increasing shortness of breath with exercising and seems to be worsening over time with weight gain. She notes getting out of breath sooner with activity than she used to. This has not gotten worse recently. Stacey Castaneda denies shortness of breath at rest or orthopnea.  3. Primary hypertension Stacey Castaneda's blood pressure is well-controlled on olmesartan-amlodipine-HCTZ 40-10-25 mg daily.   4. OSA on CPAP Stacey Castaneda gets 4 to 6 hours of sleep with CPAP at night.  5. Depression with anxiety Stacey Castaneda is on Zoloft 100 mg daily and Wellbutrin XL 150 mg daily.  Her PHQ-9 score is 20.  Assessment/Plan:   1. Other fatigue Stacey Castaneda does feel that her weight is causing her energy to be lower than it should be. Fatigue may be related to obesity, depression or many other causes. Labs will be ordered, and in the meanwhile, Stacey Castaneda will focus on self care  including making healthy food choices, increasing physical activity and focusing on stress reduction.  - EKG 12-Lead - VITAMIN D 25 Hydroxy (Vit-D Deficiency, Fractures) - TSH + free T4 - Vitamin B12 - CBC - Insulin, Free and Total - Hemoglobin A1c  2. SOB (shortness of breath) Stacey Castaneda does feel that she gets out of breath more easily that she used to when she exercises.  Stacey Castaneda shortness of breath appears to be obesity related and exercise induced. She has agreed to work on weight loss and gradually increase exercise to treat her exercise induced shortness of breath. Will continue to monitor closely.  3. Primary hypertension We will check labs today.  Stacey Castaneda will continue her current medications per her PCP.  She will begin to decrease her intake of high sodium foods.  - Comprehensive metabolic panel - Lipid panel  4. OSA on CPAP Stacey Castaneda will work on improving sleep duration to 6 to 8 hours a night with CPAP.  And she will work on weight reduction.  5. Depression with anxiety Stacey Castaneda plans to set up cognitive behavioral therapy visit with Dr. Mallie Castaneda given long history of disorder eating.  She will continue her current medications.  6. Obesity, current BMI 53.6 Stacey Castaneda is currently in the action stage of change and her goal is to continue with weight loss efforts. I recommend Stacey Castaneda begin the structured treatment plan as follows:  She has agreed to the Category 4 Plan and keeping a food journal and adhering to recommended goals of 1800 calories and 120 grams of protein daily.  Exercise goals: Think about small walks throughout the day.  Behavioral modification strategies: increasing lean protein intake, increasing water intake, no skipping meals, meal planning and cooking strategies, dealing with family or coworker sabotage, planning for success, and decreasing junk food.  She was informed of the importance of frequent follow-up visits to maximize her success with intensive lifestyle modifications for her multiple health conditions. She was informed we would discuss her lab results at her next visit unless there is a critical issue that needs to be addressed sooner. Stacey Castaneda agreed to keep her next visit at the agreed upon time to discuss these results.  Objective:   Blood pressure 138/84, pulse 79, temperature 98.3 F (36.8 C), height '5\' 4"'$   (1.626 m), weight (!) 312 lb (141.5 kg), SpO2 96 %. Body mass index is 53.55 kg/m.  EKG: Normal sinus rhythm, rate 72 BPM.  Indirect Calorimeter completed today shows a VO2 of 383 and a REE of 2650.  Her calculated basal metabolic rate is 0539 thus her basal metabolic rate is better than expected.  General: Cooperative, alert, well developed, in no acute distress. HEENT: Conjunctivae and lids unremarkable. Cardiovascular: Regular rhythm.  Lungs: Normal work of breathing. Neurologic: No focal deficits.   Lab Results  Component Value Date   CREATININE 0.70 12/03/2021   BUN 12 12/03/2021   NA 138 12/03/2021   K 4.5 12/03/2021   CL 102 12/03/2021   CO2 21 12/03/2021   Lab Results  Component Value Date   ALT 19 12/03/2021   AST 14 12/03/2021   ALKPHOS 83 12/03/2021   BILITOT 0.2 12/03/2021   Lab Results  Component Value Date   HGBA1C 5.6 12/03/2021   HGBA1C 4.9 05/21/2016   HGBA1C 5.4 10/14/2013   No results found for: "INSULIN" Lab Results  Component Value Date   TSH 1.070 12/03/2021   Lab Results  Component Value Date   CHOL 189 12/03/2021   HDL 53  12/03/2021   LDLCALC 119 (H) 12/03/2021   LDLDIRECT 111 (H) 02/20/2012   TRIG 96 12/03/2021   CHOLHDL 3.6 12/03/2021   Lab Results  Component Value Date   WBC 9.0 12/03/2021   HGB 12.8 12/03/2021   HCT 38.6 12/03/2021   MCV 88 12/03/2021   PLT 282 12/03/2021   No results found for: "IRON", "TIBC", "FERRITIN"  Attestation Statements:   Reviewed by clinician on day of visit: allergies, medications, problem list, medical history, surgical history, family history, social history, and previous encounter notes.  Wilhemena Durie, am acting as transcriptionist for Loyal Gambler, DO.  I have reviewed the above documentation for accuracy and completeness, and I agree with the above. Dell Ponto, DO

## 2021-12-12 ENCOUNTER — Encounter (INDEPENDENT_AMBULATORY_CARE_PROVIDER_SITE_OTHER): Payer: Self-pay

## 2021-12-17 ENCOUNTER — Ambulatory Visit (INDEPENDENT_AMBULATORY_CARE_PROVIDER_SITE_OTHER): Payer: Managed Care, Other (non HMO) | Admitting: Family Medicine

## 2021-12-17 ENCOUNTER — Encounter (INDEPENDENT_AMBULATORY_CARE_PROVIDER_SITE_OTHER): Payer: Self-pay | Admitting: Family Medicine

## 2021-12-17 ENCOUNTER — Other Ambulatory Visit (HOSPITAL_BASED_OUTPATIENT_CLINIC_OR_DEPARTMENT_OTHER): Payer: Self-pay

## 2021-12-17 VITALS — BP 144/79 | HR 78 | Temp 98.3°F | Ht 64.0 in | Wt 310.0 lb

## 2021-12-17 DIAGNOSIS — F3289 Other specified depressive episodes: Secondary | ICD-10-CM | POA: Diagnosis not present

## 2021-12-17 DIAGNOSIS — E559 Vitamin D deficiency, unspecified: Secondary | ICD-10-CM | POA: Diagnosis not present

## 2021-12-17 DIAGNOSIS — G4733 Obstructive sleep apnea (adult) (pediatric): Secondary | ICD-10-CM | POA: Diagnosis not present

## 2021-12-17 DIAGNOSIS — Z9989 Dependence on other enabling machines and devices: Secondary | ICD-10-CM

## 2021-12-17 DIAGNOSIS — E8881 Metabolic syndrome: Secondary | ICD-10-CM

## 2021-12-17 DIAGNOSIS — Z6841 Body Mass Index (BMI) 40.0 and over, adult: Secondary | ICD-10-CM

## 2021-12-17 DIAGNOSIS — E669 Obesity, unspecified: Secondary | ICD-10-CM

## 2021-12-17 MED ORDER — VITAMIN D (ERGOCALCIFEROL) 1.25 MG (50000 UNIT) PO CAPS
50000.0000 [IU] | ORAL_CAPSULE | ORAL | 0 refills | Status: DC
  Filled 2021-12-17: qty 5, 35d supply, fill #0

## 2021-12-18 ENCOUNTER — Ambulatory Visit (INDEPENDENT_AMBULATORY_CARE_PROVIDER_SITE_OTHER): Payer: Managed Care, Other (non HMO) | Admitting: Bariatrics

## 2021-12-19 DIAGNOSIS — E8881 Metabolic syndrome: Secondary | ICD-10-CM | POA: Insufficient documentation

## 2021-12-19 DIAGNOSIS — E88819 Insulin resistance, unspecified: Secondary | ICD-10-CM | POA: Insufficient documentation

## 2021-12-19 DIAGNOSIS — F32A Depression, unspecified: Secondary | ICD-10-CM | POA: Insufficient documentation

## 2021-12-19 DIAGNOSIS — G4733 Obstructive sleep apnea (adult) (pediatric): Secondary | ICD-10-CM | POA: Insufficient documentation

## 2021-12-19 NOTE — Progress Notes (Signed)
Chief Complaint:   OBESITY Stacey Castaneda is here to discuss her progress with her obesity treatment plan along with follow-up of her obesity related diagnoses. Stacey Castaneda is on the Category 4 Plan and keeping a food journal and adhering to recommended goals of 1800 calories and 120 protein and states she is following her eating plan approximately 80% of the time. Stacey Castaneda states she is not exercising.  Today's visit was #: 2 Starting weight: 312 lbs Starting date: 12/03/2021 Today's weight: 310 lbs Today's date: 12/17/2021 Total lbs lost to date: 2 lbs Total lbs lost since last in-office visit: 2 lbs  Interim History: Started to buy items on  meal plan.  Feeling hungrier between meals and unable to get in the full breakfast options.  Tried out some [employer] meals for lunches or dinner.  Husband has been more supportive.  She has been wearing her smart watch.   Subjective:   1. Vitamin D deficiency Discussed labs with patient today. Vitamin D level low 13.9, 12/03/21. Complains of fatigue.  2. OSA on CPAP Using CPAP and getting in 6-7 hours of sleep at night, she has been working to increase sleep time.   3. Insulin resistance Discussed labs with patient today. Fasting insulin elevated at 29 on 12/03/21.  A1c <5.7.  Discussed use of Metformin or GLP-1 receptor agonist.  She declined.    4. Other depression, with emotinal eating On Zoloft 100 mg daily and Wellbutrin XL 150 mg daily.  PHQ 9:20.  Counseling not yet.   Assessment/Plan:   1. Vitamin D deficiency Start- Vitamin D, Ergocalciferol, (DRISDOL) 1.25 MG (50000 UNIT) CAPS capsule; Take 1 capsule (50,000 Units total) by mouth every 7 (seven) days.  Dispense: 5 capsule; Refill: 0  2. OSA on CPAP Continue CPAP nightly and work on weight loss.   3. Insulin resistance Continue to work on a lower sugar, lower carb diet.   4. Other depression, with emotinal eating Considered CBT with Dr [name], she declined.   5. Obesity,  current BMI 53.3 Journal food intake and note satiety and/or GI upset.   Stacey Castaneda is currently in the action stage of change. As such, her goal is to continue with weight loss efforts. She has agreed to keeping a food journal and adhering to recommended goals of 1800 calories and 120 protein daily.    Exercise goals:  Track steps daily.   Behavioral modification strategies: increasing lean protein intake, increasing water intake, decreasing eating out, no skipping meals, better snacking choices, dealing with family or coworker sabotage, keeping a strict food journal, and decreasing junk food.  Stacey Castaneda has agreed to follow-up with our clinic in 2 weeks. She was informed of the importance of frequent follow-up visits to maximize her success with intensive lifestyle modifications for her multiple health conditions.   Objective:   Blood pressure (!) 144/79, pulse 78, temperature 98.3 F (36.8 C), height '5\' 4"'$  (1.626 m), weight (!) 310 lb (140.6 kg), SpO2 98 %. Body mass index is 53.21 kg/m.  General: Cooperative, alert, well developed, in no acute distress. HEENT: Conjunctivae and lids unremarkable. Cardiovascular: Regular rhythm.  Lungs: Normal work of breathing. Neurologic: No focal deficits.   Lab Results  Component Value Date   CREATININE 0.70 12/03/2021   BUN 12 12/03/2021   NA 138 12/03/2021   K 4.5 12/03/2021   CL 102 12/03/2021   CO2 21 12/03/2021   Lab Results  Component Value Date   ALT 19 12/03/2021   AST 14  12/03/2021   ALKPHOS 83 12/03/2021   BILITOT 0.2 12/03/2021   Lab Results  Component Value Date   HGBA1C 5.6 12/03/2021   HGBA1C 4.9 05/21/2016   HGBA1C 5.4 10/14/2013   No results found for: "INSULIN" Lab Results  Component Value Date   TSH 1.070 12/03/2021   Lab Results  Component Value Date   CHOL 189 12/03/2021   HDL 53 12/03/2021   LDLCALC 119 (H) 12/03/2021   LDLDIRECT 111 (H) 02/20/2012   TRIG 96 12/03/2021   CHOLHDL 3.6 12/03/2021   Lab  Results  Component Value Date   VD25OH 13.9 (L) 12/03/2021   Lab Results  Component Value Date   WBC 9.0 12/03/2021   HGB 12.8 12/03/2021   HCT 38.6 12/03/2021   MCV 88 12/03/2021   PLT 282 12/03/2021   No results found for: "IRON", "TIBC", "FERRITIN"  Attestation Statements:   Reviewed by clinician on day of visit: allergies, medications, problem list, medical history, surgical history, family history, social history, and previous encounter notes.  I, Davy Pique, am acting as Location manager for Loyal Gambler, DO.  I have reviewed the above documentation for accuracy and completeness, and I agree with the above. Dell Ponto, DO

## 2022-01-02 ENCOUNTER — Encounter (INDEPENDENT_AMBULATORY_CARE_PROVIDER_SITE_OTHER): Payer: Self-pay | Admitting: Family Medicine

## 2022-01-02 ENCOUNTER — Ambulatory Visit (INDEPENDENT_AMBULATORY_CARE_PROVIDER_SITE_OTHER): Payer: Managed Care, Other (non HMO) | Admitting: Family Medicine

## 2022-01-02 VITALS — BP 114/66 | HR 99 | Temp 98.5°F | Ht 64.0 in | Wt 309.0 lb

## 2022-01-02 DIAGNOSIS — Z6841 Body Mass Index (BMI) 40.0 and over, adult: Secondary | ICD-10-CM

## 2022-01-02 DIAGNOSIS — R29898 Other symptoms and signs involving the musculoskeletal system: Secondary | ICD-10-CM | POA: Diagnosis not present

## 2022-01-02 DIAGNOSIS — E8881 Metabolic syndrome: Secondary | ICD-10-CM

## 2022-01-02 DIAGNOSIS — E559 Vitamin D deficiency, unspecified: Secondary | ICD-10-CM | POA: Diagnosis not present

## 2022-01-02 DIAGNOSIS — E669 Obesity, unspecified: Secondary | ICD-10-CM

## 2022-01-02 DIAGNOSIS — G4733 Obstructive sleep apnea (adult) (pediatric): Secondary | ICD-10-CM

## 2022-01-02 DIAGNOSIS — F3289 Other specified depressive episodes: Secondary | ICD-10-CM

## 2022-01-02 DIAGNOSIS — Z9989 Dependence on other enabling machines and devices: Secondary | ICD-10-CM

## 2022-01-02 MED ORDER — VITAMIN D (ERGOCALCIFEROL) 1.25 MG (50000 UNIT) PO CAPS: 50000.0000 [IU] | ORAL_CAPSULE | ORAL | 0 refills | Status: DC

## 2022-01-12 ENCOUNTER — Other Ambulatory Visit: Payer: Self-pay | Admitting: Family Medicine

## 2022-01-12 DIAGNOSIS — I10 Essential (primary) hypertension: Secondary | ICD-10-CM

## 2022-01-13 NOTE — Progress Notes (Unsigned)
Chief Complaint:   OBESITY Stacey Castaneda is here to discuss her progress with her obesity treatment plan along with follow-up of her obesity related diagnoses. Stacey Castaneda is on keeping a food journal and adhering to recommended goals of 1800 calories and 120 protein and states she is following her eating plan approximately 40-60% of the time. Stacey Castaneda states she is walking, taking out the trash, helping her husband 5 minutes 3-4 times per week.  Today's visit was #: 3 Starting weight: 312 lbs Starting date: 12/03/2021 Today's weight: 309 lbs Today's date: 01/02/2022 Total lbs lost to date: 3 lbs Total lbs lost since last in-office visit: 1 lb  Interim History: time is limiting her walking with work schedule. Sedentary job working from home.  Still sometimes skips breakfast.  Started to get hungry in the morning, choosing eggs or protein shakes some days.  Getting <2,000 steps per day.  Husband not supportive.   Subjective:   1. Vitamin D deficiency Energy level starting to improve on prescription Vitamin D weekly.   2. OSA on CPAP Improved sleep time to >6 hours per night with CPAP.  3. Insulin resistance Fasting insulin 29, Has decreased intake of carbs and sugar.  Has room to increase physical activity to improve insulin sensitivity.   4. Muscular deconditioning Averaging 1400 steps per day without  any musculoskeletal barriers to exercise.  She does not feel quite read to start a regular exercise program.    5. Other depression, with emotional eating On Zoloft and Wellbutrin, improved mood.  Declined need for CBT.    Assessment/Plan:   1. Vitamin D deficiency Recheck Vitamin D level in 2 months.  Refill - Vitamin D, Ergocalciferol, (DRISDOL) 1.25 MG (50000 UNIT) CAPS capsule; Take 1 capsule (50,000 Units total) by mouth every 7 (seven) days.  Dispense: 12 capsule; Refill: 0  2. OSA on CPAP Continue CPAP at night and active plan for weight loss.   3. Insulin  resistance Recheck fasting insulin in 2-3 months.   4. Muscular deconditioning Consider  starting PT to help get started.   5. Other depression, with emotional eating Continue to work on mindful eating and self care.   6. Obesity, current BMI 53.1 Can use a protein shake in place of skipping breakfast if needed.  Reviewed cost friendly options.  Stacey Castaneda is currently in the action stage of change. As such, her goal is to continue with weight loss efforts. She has agreed to keeping a food journal and adhering to recommended goals of 1800 calories and 120 protein.   Exercise goals:  track daily steps.   Behavioral modification strategies: increasing lean protein intake, increasing vegetables, increasing water intake, no skipping meals, meal planning and cooking strategies, better snacking choices, dealing with family or coworker sabotage, and decreasing junk food.  Stacey Castaneda has agreed to follow-up with our clinic in 3 weeks. She was informed of the importance of frequent follow-up visits to maximize her success with intensive lifestyle modifications for her multiple health conditions.   Objective:   Blood pressure 114/66, pulse 99, temperature 98.5 F (36.9 C), height '5\' 4"'$  (1.626 m), weight (!) 309 lb (140.2 kg), SpO2 95 %. Body mass index is 53.04 kg/m.  General: Cooperative, alert, well developed, in no acute distress. HEENT: Conjunctivae and lids unremarkable. Cardiovascular: Regular rhythm.  Lungs: Normal work of breathing. Neurologic: No focal deficits.   Lab Results  Component Value Date   CREATININE 0.70 12/03/2021   BUN 12 12/03/2021   NA  138 12/03/2021   K 4.5 12/03/2021   CL 102 12/03/2021   CO2 21 12/03/2021   Lab Results  Component Value Date   ALT 19 12/03/2021   AST 14 12/03/2021   ALKPHOS 83 12/03/2021   BILITOT 0.2 12/03/2021   Lab Results  Component Value Date   HGBA1C 5.6 12/03/2021   HGBA1C 4.9 05/21/2016   HGBA1C 5.4 10/14/2013   No results  found for: "INSULIN" Lab Results  Component Value Date   TSH 1.070 12/03/2021   Lab Results  Component Value Date   CHOL 189 12/03/2021   HDL 53 12/03/2021   LDLCALC 119 (H) 12/03/2021   LDLDIRECT 111 (H) 02/20/2012   TRIG 96 12/03/2021   CHOLHDL 3.6 12/03/2021   Lab Results  Component Value Date   VD25OH 13.9 (L) 12/03/2021   Lab Results  Component Value Date   WBC 9.0 12/03/2021   HGB 12.8 12/03/2021   HCT 38.6 12/03/2021   MCV 88 12/03/2021   PLT 282 12/03/2021   No results found for: "IRON", "TIBC", "FERRITIN"  Attestation Statements:   Reviewed by clinician on day of visit: allergies, medications, problem list, medical history, surgical history, family history, social history, and previous encounter notes.  I, Davy Pique, am acting as Location manager for Loyal Gambler, DO.  I have reviewed the above documentation for accuracy and completeness, and I agree with the above. Dell Ponto, DO

## 2022-01-28 ENCOUNTER — Ambulatory Visit (INDEPENDENT_AMBULATORY_CARE_PROVIDER_SITE_OTHER): Payer: Managed Care, Other (non HMO) | Admitting: Family Medicine

## 2022-01-28 ENCOUNTER — Encounter (INDEPENDENT_AMBULATORY_CARE_PROVIDER_SITE_OTHER): Payer: Self-pay | Admitting: Family Medicine

## 2022-01-28 VITALS — BP 139/81 | HR 78 | Temp 99.0°F | Ht 64.0 in | Wt 301.0 lb

## 2022-01-28 DIAGNOSIS — E559 Vitamin D deficiency, unspecified: Secondary | ICD-10-CM

## 2022-01-28 DIAGNOSIS — I10 Essential (primary) hypertension: Secondary | ICD-10-CM

## 2022-01-28 DIAGNOSIS — F419 Anxiety disorder, unspecified: Secondary | ICD-10-CM | POA: Diagnosis not present

## 2022-01-28 DIAGNOSIS — G4733 Obstructive sleep apnea (adult) (pediatric): Secondary | ICD-10-CM

## 2022-01-28 DIAGNOSIS — Z6841 Body Mass Index (BMI) 40.0 and over, adult: Secondary | ICD-10-CM

## 2022-01-28 DIAGNOSIS — Z9989 Dependence on other enabling machines and devices: Secondary | ICD-10-CM

## 2022-01-28 DIAGNOSIS — E669 Obesity, unspecified: Secondary | ICD-10-CM

## 2022-01-28 DIAGNOSIS — F32A Depression, unspecified: Secondary | ICD-10-CM | POA: Insufficient documentation

## 2022-01-30 NOTE — Progress Notes (Unsigned)
Chief Complaint:   OBESITY Stacey Castaneda is here to discuss her progress with her obesity treatment plan along with follow-up of her obesity related diagnoses. Stacey Castaneda is on keeping a food journal and adhering to recommended goals of 1800 calories and 120 protein and states she is following her eating plan approximately 20-60% of the time. Stacey Castaneda states she is not exercising.   Today's visit was #: 4 Starting weight: 312 lbs Starting date: 12/03/2021 Today's weight: 301 lbs Today's date: 01/28/2022 Total lbs lost to date: 11 lbs Total lbs lost since last in-office visit: 8 lbs  Interim History: Doing a better job with getting protein intake. Energy level improving, working a lot with sedentary job.  Husband drank all of her protein shakes and some of her cheese and [place].  Eating factor meals for some dinners.  She has been learning to say no and practice mindful eating.   Subjective:   1. Vitamin D deficiency She is currently taking prescription vitamin D 50,000 IU each week. She denies nausea, vomiting or muscle weakness.  2. OSA on CPAP She has been getting 6-8 hours of sleep per night on CPAP.   3. Essential hypertension Blood pressure well controlled on Olmesartan/Amlodipine/HCTZ, 40-10-25 mg daily.   4. Anxiety and depression She is on Zoloft 100 mg daily and Wellbutrin XL 150 mg daily.  Her mood is stable.  She has been practicing mindful eating.    Assessment/Plan:   1. Vitamin D deficiency Recheck Vitamin D level in November and continue prescription Vitamin D 50,000 IU weekly. No refill needed today.   2. OSA on CPAP Continue CPAP nightly and active plan for weight loss.   3. Essential hypertension Continue current blood pressure medications per PCP.    4. Anxiety and depression She has declined CBT.  Continue current medications.   5. Obesity,current BMI 51.7 Stacey Castaneda is currently in the action stage of change. As such, her goal is to continue with weight  loss efforts. She has agreed to keeping a food journal and adhering to recommended goals of 1800 calories and 120 protein.   Exercise goals:  As is.   Behavioral modification strategies: increasing lean protein intake, decreasing simple carbohydrates, increasing water intake, decreasing eating out, no skipping meals, meal planning and cooking strategies, better snacking choices, and decreasing junk food.  [name] has agreed to follow-up with our clinic in 3 weeks. She was informed of the importance of frequent follow-up visits to maximize her success with intensive lifestyle modifications for her multiple health conditions.   Objective:   Blood pressure 139/81, pulse 78, temperature 99 F (37.2 C), height '5\' 4"'$  (1.626 m), weight (!) 301 lb (136.5 kg), SpO2 96 %. Body mass index is 51.67 kg/m.  General: Cooperative, alert, well developed, in no acute distress. HEENT: Conjunctivae and lids unremarkable. Cardiovascular: Regular rhythm.  Lungs: Normal work of breathing. Neurologic: No focal deficits.   Lab Results  Component Value Date   CREATININE 0.70 12/03/2021   BUN 12 12/03/2021   NA 138 12/03/2021   K 4.5 12/03/2021   CL 102 12/03/2021   CO2 21 12/03/2021   Lab Results  Component Value Date   ALT 19 12/03/2021   AST 14 12/03/2021   ALKPHOS 83 12/03/2021   BILITOT 0.2 12/03/2021   Lab Results  Component Value Date   HGBA1C 5.6 12/03/2021   HGBA1C 4.9 05/21/2016   HGBA1C 5.4 10/14/2013   No results found for: "INSULIN" Lab Results  Component Value  Date   TSH 1.070 12/03/2021   Lab Results  Component Value Date   CHOL 189 12/03/2021   HDL 53 12/03/2021   LDLCALC 119 (H) 12/03/2021   LDLDIRECT 111 (H) 02/20/2012   TRIG 96 12/03/2021   CHOLHDL 3.6 12/03/2021   Lab Results  Component Value Date   VD25OH 13.9 (L) 12/03/2021   Lab Results  Component Value Date   WBC 9.0 12/03/2021   HGB 12.8 12/03/2021   HCT 38.6 12/03/2021   MCV 88 12/03/2021   PLT 282  12/03/2021   No results found for: "IRON", "TIBC", "FERRITIN"  Attestation Statements:   Reviewed by clinician on day of visit: allergies, medications, problem list, medical history, surgical history, family history, social history, and previous encounter notes.  I, Davy Pique, am acting as Location manager for Loyal Gambler, DO.  I have reviewed the above documentation for accuracy and completeness, and I agree with the above. -  ***

## 2022-02-04 ENCOUNTER — Encounter: Payer: Self-pay | Admitting: Family Medicine

## 2022-02-05 ENCOUNTER — Ambulatory Visit (INDEPENDENT_AMBULATORY_CARE_PROVIDER_SITE_OTHER): Payer: Managed Care, Other (non HMO) | Admitting: Family Medicine

## 2022-02-11 ENCOUNTER — Ambulatory Visit (INDEPENDENT_AMBULATORY_CARE_PROVIDER_SITE_OTHER): Payer: Managed Care, Other (non HMO) | Admitting: Family Medicine

## 2022-02-11 ENCOUNTER — Encounter (INDEPENDENT_AMBULATORY_CARE_PROVIDER_SITE_OTHER): Payer: Self-pay | Admitting: Family Medicine

## 2022-02-11 VITALS — BP 138/82 | HR 96 | Temp 98.8°F | Ht 64.0 in | Wt 302.0 lb

## 2022-02-11 DIAGNOSIS — E559 Vitamin D deficiency, unspecified: Secondary | ICD-10-CM | POA: Diagnosis not present

## 2022-02-11 DIAGNOSIS — Z6841 Body Mass Index (BMI) 40.0 and over, adult: Secondary | ICD-10-CM

## 2022-02-11 DIAGNOSIS — F32A Depression, unspecified: Secondary | ICD-10-CM

## 2022-02-11 DIAGNOSIS — F419 Anxiety disorder, unspecified: Secondary | ICD-10-CM

## 2022-02-11 DIAGNOSIS — I10 Essential (primary) hypertension: Secondary | ICD-10-CM

## 2022-02-11 DIAGNOSIS — E669 Obesity, unspecified: Secondary | ICD-10-CM

## 2022-02-19 NOTE — Progress Notes (Unsigned)
Chief Complaint:   OBESITY Stacey Castaneda is here to discuss her progress with her obesity treatment plan along with follow-up of her obesity related diagnoses. Stacey Castaneda is on keeping a food journal and adhering to recommended goals of 1800 calories and 120 protein and states she is following her eating plan approximately 50% of the time. Stacey Castaneda states she is walking for 45 minutes 2 times per week.  Today's visit was #: 5 Starting weight: 312 lbs Starting date: 12/03/2021 Today's weight: 302 lbs Today's date: 02/11/22 Total lbs lost to date: 10 Total lbs lost since last in-office visit: +1  Interim History: Had some [place] stew and desserts 2 times in the past week.  Had fast food 2 times.  Enjoys fries.  Using My [employer]itness Pal.  Aims for 1800 cal, 120 g of protein daily.  Denies big appetite or food cravings.  No consistent exercise-lack support at home and works long hours-sedentary.  Subjective:   1. Anxiety and depression On Zoloft 100 mg and Wellbutrin XL 150 mg daily. Refused CBT.  2. Vitamin D deficiency On prescription vitamin D 50,000 IU weekly.  Energy level is improving. Vitamin D level 13.9 on 12/03/2021.  3. Essential hypertension Blood pressure well controlled on olmesartan/HCTZ/amlodipine daily.  Assessment/Plan:   1. Anxiety and depression Continue current medications per PCP.  2. Vitamin D deficiency Continue prescription vitamin D weekly. Recheck vitamin D level in December.  3. Essential hypertension Continue current blood pressure meds. Look for blood pressure improvements with weight loss.  4. Obesity, current BMI 51.9 1.  Use Lose It app to track daily intake. 2.  Plans to check out [employer] in person. 3.  Recipe handouts given.  Stacey Castaneda is currently in the action stage of change. As such, her goal is to continue with weight loss efforts. She has agreed to keeping a food journal and adhering to recommended goals of 1800 calories and 120  protein.   Exercise goals: Firm up plan to increase walking.  Behavioral modification strategies: increasing lean protein intake, decreasing simple carbohydrates, increasing vegetables, increasing water intake, decreasing eating out, no skipping meals, meal planning and cooking strategies, keeping healthy foods in the home, emotional eating strategies, and decreasing junk food.  Stacey Castaneda has agreed to follow-up with our clinic in 4 weeks. She was informed of the importance of frequent follow-up visits to maximize her success with intensive lifestyle modifications for her multiple health conditions.    Objective:   Blood pressure 138/82, pulse 96, temperature 98.8 F (37.1 C), height '5\' 4"'$  (1.626 m), weight (!) 302 lb (137 kg), SpO2 97 %. Body mass index is 51.84 kg/m.  General: Cooperative, alert, well developed, in no acute distress. HEENT: Conjunctivae and lids unremarkable. Cardiovascular: Regular rhythm.  Lungs: Normal work of breathing. Neurologic: No focal deficits.   Lab Results  Component Value Date   CREATININE 0.70 12/03/2021   BUN 12 12/03/2021   NA 138 12/03/2021   K 4.5 12/03/2021   CL 102 12/03/2021   CO2 21 12/03/2021   Lab Results  Component Value Date   ALT 19 12/03/2021   AST 14 12/03/2021   ALKPHOS 83 12/03/2021   BILITOT 0.2 12/03/2021   Lab Results  Component Value Date   HGBA1C 5.6 12/03/2021   HGBA1C 4.9 05/21/2016   HGBA1C 5.4 10/14/2013   No results found for: "INSULIN" Lab Results  Component Value Date   TSH 1.070 12/03/2021   Lab Results  Component Value Date  CHOL 189 12/03/2021   HDL 53 12/03/2021   LDLCALC 119 (H) 12/03/2021   LDLDIRECT 111 (H) 02/20/2012   TRIG 96 12/03/2021   CHOLHDL 3.6 12/03/2021   Lab Results  Component Value Date   VD25OH 13.9 (L) 12/03/2021   Lab Results  Component Value Date   WBC 9.0 12/03/2021   HGB 12.8 12/03/2021   HCT 38.6 12/03/2021   MCV 88 12/03/2021   PLT 282 12/03/2021   No results  found for: "IRON", "TIBC", "FERRITIN"   Attestation Statements:   Reviewed by clinician on day of visit: allergies, medications, problem list, medical history, surgical history, family history, social history, and previous encounter notes.  I, Georgianne Fick, FNP, am acting as transcriptionist for Dr. Loyal Gambler.  I have reviewed the above documentation for accuracy and completeness, and I agree with the above. Dell Ponto, DO

## 2022-03-20 ENCOUNTER — Encounter (INDEPENDENT_AMBULATORY_CARE_PROVIDER_SITE_OTHER): Payer: Self-pay | Admitting: Family Medicine

## 2022-03-20 ENCOUNTER — Ambulatory Visit (INDEPENDENT_AMBULATORY_CARE_PROVIDER_SITE_OTHER): Payer: Managed Care, Other (non HMO) | Admitting: Family Medicine

## 2022-03-20 VITALS — BP 137/85 | HR 96 | Temp 98.5°F | Ht 64.0 in | Wt 291.0 lb

## 2022-03-20 DIAGNOSIS — F329 Major depressive disorder, single episode, unspecified: Secondary | ICD-10-CM

## 2022-03-20 DIAGNOSIS — E559 Vitamin D deficiency, unspecified: Secondary | ICD-10-CM

## 2022-03-20 DIAGNOSIS — E669 Obesity, unspecified: Secondary | ICD-10-CM | POA: Diagnosis not present

## 2022-03-20 DIAGNOSIS — Z6841 Body Mass Index (BMI) 40.0 and over, adult: Secondary | ICD-10-CM | POA: Diagnosis not present

## 2022-03-20 MED ORDER — VITAMIN D (ERGOCALCIFEROL) 1.25 MG (50000 UNIT) PO CAPS: 50000.0000 [IU] | ORAL_CAPSULE | ORAL | 0 refills | Status: DC

## 2022-03-22 ENCOUNTER — Encounter: Payer: Self-pay | Admitting: Family Medicine

## 2022-03-22 ENCOUNTER — Ambulatory Visit: Payer: Managed Care, Other (non HMO) | Admitting: Family Medicine

## 2022-03-22 VITALS — BP 156/79 | HR 116 | Ht 64.0 in | Wt 293.0 lb

## 2022-03-22 DIAGNOSIS — I10 Essential (primary) hypertension: Secondary | ICD-10-CM | POA: Diagnosis not present

## 2022-03-22 DIAGNOSIS — Z23 Encounter for immunization: Secondary | ICD-10-CM

## 2022-03-22 DIAGNOSIS — E88819 Insulin resistance, unspecified: Secondary | ICD-10-CM | POA: Diagnosis not present

## 2022-03-22 DIAGNOSIS — J4540 Moderate persistent asthma, uncomplicated: Secondary | ICD-10-CM

## 2022-03-22 DIAGNOSIS — Z1231 Encounter for screening mammogram for malignant neoplasm of breast: Secondary | ICD-10-CM | POA: Diagnosis not present

## 2022-03-22 DIAGNOSIS — G5601 Carpal tunnel syndrome, right upper limb: Secondary | ICD-10-CM

## 2022-03-22 MED ORDER — FLUTICASONE FUROATE-VILANTEROL 100-25 MCG/ACT IN AEPB: 1.0000 | INHALATION_SPRAY | Freq: Every day | RESPIRATORY_TRACT | 3 refills | Status: DC

## 2022-03-22 NOTE — Assessment & Plan Note (Signed)
Blood pressure is elevated today but it looked better 2 days ago when she was at another doctor's office.  So for now I am not can make any changes we will just continue to monitor it.

## 2022-03-22 NOTE — Assessment & Plan Note (Addendum)
Stable.  No recent flares or exacerbations.  Refill Breo.

## 2022-03-22 NOTE — Progress Notes (Signed)
Established Patient Office Visit  Subjective   Patient ID: Stacey Castaneda, female    DOB: 02/28/1971  Age: 51 y.o. MRN: [id number]  Chief Complaint  Patient presents with   Hypertension    HPI   Hypertension- Pt denies chest pain, SOB, dizziness, or heart palpitations.  Taking meds as directed w/o problems.  Denies medication side effects.    Impaired fasting glucose-no increased thirst or urination. No symptoms consistent with hypoglycemia.recent A1C in July  F/U Asthma - she is dong well. No recent flares.  She will need a refill on her Breo.  [facility]  He is also been getting some numbness and tingling in her fingers and her right hand.  She says it was really bothersome last week, this week has been a little bit better.  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    ROS    Objective:     BP (!) 156/79   Pulse (!) 116   Ht '5\' 4"'$  (1.626 m)   Wt 293 lb (132.9 kg)   SpO2 94%   BMI 50.29 kg/m    Physical Exam Vitals and nursing note reviewed.  Constitutional:      Appearance: She is well-developed.  HENT:     Head: Normocephalic and atraumatic.  Cardiovascular:     Rate and Rhythm: Normal rate and regular rhythm.     Heart sounds: Normal heart sounds.  Pulmonary:     Effort: Pulmonary effort is normal.     Breath sounds: Normal breath sounds.  Skin:    General: Skin is warm and dry.  Neurological:     Mental Status: She is alert and oriented to person, place, and time.  Psychiatric:        Behavior: Behavior normal.      No results found for any visits on 03/22/22.    The 10-year ASCVD risk score (Arnett DK, et al., 2019) is: 2.6%    Assessment & Plan:   Problem List Items Addressed This Visit       Cardiovascular and Mediastinum   Essential hypertension - Primary    Blood pressure is elevated today but it looked better 2 days ago when she was at another doctor's office.  So for now I am  not can make any changes we will just continue to monitor it.        Respiratory   Reactive airway disease    Stable.  No recent flares or exacerbations.  Refill Breo.        Endocrine   Insulin resistance   Other Visit Diagnoses     Need for immunization against influenza       Relevant Orders   Flu Vaccine QUAD 31moIM (Fluarix, Fluzone & Alfiuria Quad PF) (Completed)   Encounter for immunization       Relevant Orders   Pfizer Fall 2023 Covid-19 Vaccine 147yrand older (Completed)   Screening mammogram for breast cancer       Relevant Orders   MM 3D SCREEN BREAST BILATERAL   Carpal tunnel syndrome of right wrist           Pressure schedule her mammogram.  She would like to get it done in January.  Flu vaccine and COVID-vaccine updated today.  Paresthesias is in the right hand-suspect carpal tunnel syndrome-discussed wearing a cock-up splint.  She actually already has one at home.  Encouraged her to just wear it at night.  If it persists or continues over several weeks please let us know.  Also encouraged her to really look at the ergonomics of her workstation may be to consider changing her mouse.  Return in about 6 months (around 09/20/2022) for Hypertension.     Beatrice Lecher, MD

## 2022-04-02 NOTE — Progress Notes (Signed)
Chief Complaint:   OBESITY Stacey Castaneda is here to discuss her progress with her obesity treatment plan along with follow-up of her obesity related diagnoses. Stacey Castaneda is on keeping a food journal and adhering to recommended goals of 1800 calories and 120 protein and states she is following her eating plan approximately 10-20% of the time. Stacey Castaneda states she is walking 20-30 minutes 3 times per week.  Today's visit was #: 6 Starting weight: 312 lbs Starting date: 12/03/2021 Today's weight: 291 lbs Today's date: 03/20/2022 Total lbs lost to date: 21 lbs Total lbs lost since last in-office visit: 11 lbs  Interim History: She has been working longer hours and more stress at work.  This leads to less planning.  Stress is affecting her mood. Lacking sleep 5-6 hours.  Has [employer] on food delivery.  Change to using the lose it app.   Subjective:   1. Vitamin D deficiency She is currently taking prescription vitamin D 50,000 IU each week. She denies nausea, vomiting or muscle weakness. Last Vitamin D level 13.9.    2. Major depressive disorder, remission status unspecified, unspecified whether recurrent Stress levels high at work.  She is on Zoloft 100 mg and Wellbutrin XL 150 mg daily.   Assessment/Plan:   1. Vitamin D deficiency  Vitamin D, Ergocalciferol, (DRISDOL) 1.25 MG (50000 UNIT) CAPS capsule; Take 1 capsule (50,000 Units total) by mouth every 7 (seven) days.  Dispense: 12 capsule; Refill: 0  2. Major depressive disorder, remission status unspecified, unspecified whether recurrent Consider adding CBT.  Reviewed stress reduction techniques.   3. Obesity, current BMI 53.55 1) Discussed stress reduction. 2) plan out grocery shopping.   Stacey Castaneda is currently in the action stage of change. As such, her goal is to continue with weight loss efforts. She has agreed to keeping a food journal and adhering to recommended goals of 1800 calories and 120 protein daily.   Exercise goals:   As is.   Behavioral modification strategies: increasing lean protein intake, increasing vegetables, increasing water intake, decreasing eating out, no skipping meals, meal planning and cooking strategies, keeping healthy foods in the home, holiday eating strategies , and planning for success.  Stacey Castaneda has agreed to follow-up with our clinic in 3-4 weeks. She was informed of the importance of frequent follow-up visits to maximize her success with intensive lifestyle modifications for her multiple health conditions.   Objective:   Blood pressure 137/85, pulse 96, temperature 98.5 F (36.9 C), height '5\' 4"'$  (1.626 m), weight 291 lb (132 kg), SpO2 97 %. Body mass index is 49.95 kg/m.  General: Cooperative, alert, well developed, in no acute distress. HEENT: Conjunctivae and lids unremarkable. Cardiovascular: Regular rhythm.  Lungs: Normal work of breathing. Neurologic: No focal deficits.   Lab Results  Component Value Date   CREATININE 0.70 12/03/2021   BUN 12 12/03/2021   NA 138 12/03/2021   K 4.5 12/03/2021   CL 102 12/03/2021   CO2 21 12/03/2021   Lab Results  Component Value Date   ALT 19 12/03/2021   AST 14 12/03/2021   ALKPHOS 83 12/03/2021   BILITOT 0.2 12/03/2021   Lab Results  Component Value Date   HGBA1C 5.6 12/03/2021   HGBA1C 4.9 05/21/2016   HGBA1C 5.4 10/14/2013   No results found for: "INSULIN" Lab Results  Component Value Date   TSH 1.070 12/03/2021   Lab Results  Component Value Date   CHOL 189 12/03/2021   HDL 53 12/03/2021   LDLCALC  119 (H) 12/03/2021   LDLDIRECT 111 (H) 02/20/2012   TRIG 96 12/03/2021   CHOLHDL 3.6 12/03/2021   Lab Results  Component Value Date   VD25OH 13.9 (L) 12/03/2021   Lab Results  Component Value Date   WBC 9.0 12/03/2021   HGB 12.8 12/03/2021   HCT 38.6 12/03/2021   MCV 88 12/03/2021   PLT 282 12/03/2021   No results found for: "IRON", "TIBC", "FERRITIN"  Attestation Statements:   Reviewed by clinician on  day of visit: allergies, medications, problem list, medical history, surgical history, family history, social history, and previous encounter notes.  I, Davy Pique, am acting as Location manager for Loyal Gambler, DO.  I have reviewed the above documentation for accuracy and completeness, and I agree with the above. Dell Ponto, DO

## 2022-04-11 ENCOUNTER — Encounter (INDEPENDENT_AMBULATORY_CARE_PROVIDER_SITE_OTHER): Payer: Self-pay | Admitting: Family Medicine

## 2022-04-11 ENCOUNTER — Ambulatory Visit (INDEPENDENT_AMBULATORY_CARE_PROVIDER_SITE_OTHER): Payer: Managed Care, Other (non HMO) | Admitting: Family Medicine

## 2022-04-11 VITALS — BP 110/73 | HR 81 | Temp 98.4°F | Ht 64.0 in | Wt 291.0 lb

## 2022-04-11 DIAGNOSIS — E669 Obesity, unspecified: Secondary | ICD-10-CM | POA: Diagnosis not present

## 2022-04-11 DIAGNOSIS — E88819 Insulin resistance, unspecified: Secondary | ICD-10-CM

## 2022-04-11 DIAGNOSIS — E559 Vitamin D deficiency, unspecified: Secondary | ICD-10-CM

## 2022-04-11 DIAGNOSIS — Z6841 Body Mass Index (BMI) 40.0 and over, adult: Secondary | ICD-10-CM | POA: Diagnosis not present

## 2022-04-18 NOTE — Progress Notes (Signed)
Chief Complaint:   OBESITY Stacey Castaneda is here to discuss her progress with her obesity treatment plan along with follow-up of her obesity related diagnoses. [name] is on keeping a food journal and adhering to recommended goals of 1800 calories and 120 protein and states she is following her eating plan approximately 10-20% of the time. [name] states she is not exercising.  Today's visit was #: 7 Starting weight: 312 LBS Starting date: 12/03/2021 Today's weight: 291 LBS Today's date: 04/11/2022 Total lbs lost to date: 21 LBS Total lbs lost since last in-office visit: 0  Interim History: Work stress has been bad and has been looking for a new job.  She lost a few pounds while not eating due to work stress.  She is getting poor sleep, husband buys her sweets.  No time for exercise.  Subjective:   1. Vitamin D deficiency Patient last vitamin D level was 13.9.  Her vitamin D is due at next office visit.  She has a refill of her vitamin D.  2. Insulin resistance Fasting insulin 29.  She consumes excess carbs, sweets with no consistent exercise, and she works a sedentary job.  Assessment/Plan:   1. Vitamin D deficiency Ordered labs for 2 weeks from now.  No need for refill today.  - Vitamin D, 25-Hydroxy, Total  2. Insulin resistance Repeat fasting insulin level.  Consider metformin. - Insulin, random  3. Obesity,current BMI 50.1 1.  Schedule fasting labs in 2 weeks, insulin, vitamin D level. 2.  Encourage stress reduction.  [name] is currently in the action stage of change. As such, her goal is to continue with weight loss efforts. She has agreed to keeping a food journal and adhering to recommended goals of 1800 calories and 120 protein daily.  Exercise goals: All adults should avoid inactivity. Some physical activity is better than none, and adults who participate in any amount of physical activity gain some health benefits.  Behavioral modification strategies:  increasing lean protein intake, increasing water intake, decreasing eating out, meal planning and cooking strategies, emotional eating strategies, and avoiding temptations.  [name] has agreed to follow-up with our clinic in 3-4 weeks. She was informed of the importance of frequent follow-up visits to maximize her success with intensive lifestyle modifications for her multiple health conditions.   Objective:   Blood pressure 110/73, pulse 81, temperature 98.4 F (36.9 C), height '5\' 4"'$  (1.626 m), weight 291 lb (132 kg), SpO2 98 %. Body mass index is 49.95 kg/m.  General: Cooperative, alert, well developed, in no acute distress. HEENT: Conjunctivae and lids unremarkable. Cardiovascular: Regular rhythm.  Lungs: Normal work of breathing. Neurologic: No focal deficits.   Lab Results  Component Value Date   CREATININE 0.70 12/03/2021   BUN 12 12/03/2021   NA 138 12/03/2021   K 4.5 12/03/2021   CL 102 12/03/2021   CO2 21 12/03/2021   Lab Results  Component Value Date   ALT 19 12/03/2021   AST 14 12/03/2021   ALKPHOS 83 12/03/2021   BILITOT 0.2 12/03/2021   Lab Results  Component Value Date   HGBA1C 5.6 12/03/2021   HGBA1C 4.9 05/21/2016   HGBA1C 5.4 10/14/2013   No results found for: "INSULIN" Lab Results  Component Value Date   TSH 1.070 12/03/2021   Lab Results  Component Value Date   CHOL 189 12/03/2021   HDL 53 12/03/2021   LDLCALC 119 (H) 12/03/2021   LDLDIRECT 111 (H) 02/20/2012   TRIG 96 12/03/2021  CHOLHDL 3.6 12/03/2021   Lab Results  Component Value Date   VD25OH 13.9 (L) 12/03/2021   Lab Results  Component Value Date   WBC 9.0 12/03/2021   HGB 12.8 12/03/2021   HCT 38.6 12/03/2021   MCV 88 12/03/2021   PLT 282 12/03/2021   No results found for: "IRON", "TIBC", "FERRITIN"  Attestation Statements:   Reviewed by clinician on day of visit: allergies, medications, problem list, medical history, surgical history, family history, social history, and  previous encounter notes.  I, Davy Pique, am acting as Location manager for Loyal Gambler, DO.  I have reviewed the above documentation for accuracy and completeness, and I agree with the above. Dell Ponto, DO

## 2022-04-30 ENCOUNTER — Other Ambulatory Visit: Payer: Self-pay | Admitting: Family Medicine

## 2022-04-30 DIAGNOSIS — F418 Other specified anxiety disorders: Secondary | ICD-10-CM

## 2022-04-30 LAB — INSULIN, RANDOM: INSULIN: 30.4 u[IU]/mL — ABNORMAL HIGH (ref 2.6–24.9)

## 2022-04-30 LAB — VITAMIN D, 25-HYDROXY, TOTAL: Vitamin D, 25-Hydroxy, Serum: 36 ng/mL

## 2022-05-09 ENCOUNTER — Encounter (INDEPENDENT_AMBULATORY_CARE_PROVIDER_SITE_OTHER): Payer: Self-pay | Admitting: Family Medicine

## 2022-05-09 ENCOUNTER — Ambulatory Visit (INDEPENDENT_AMBULATORY_CARE_PROVIDER_SITE_OTHER): Payer: Managed Care, Other (non HMO) | Admitting: Family Medicine

## 2022-05-09 VITALS — BP 119/79 | HR 96 | Temp 98.9°F | Ht 64.0 in | Wt 294.0 lb

## 2022-05-09 DIAGNOSIS — E559 Vitamin D deficiency, unspecified: Secondary | ICD-10-CM

## 2022-05-09 DIAGNOSIS — E88819 Insulin resistance, unspecified: Secondary | ICD-10-CM | POA: Diagnosis not present

## 2022-05-09 DIAGNOSIS — Z6841 Body Mass Index (BMI) 40.0 and over, adult: Secondary | ICD-10-CM | POA: Diagnosis not present

## 2022-05-09 DIAGNOSIS — E669 Obesity, unspecified: Secondary | ICD-10-CM

## 2022-05-09 MED ORDER — VITAMIN D (ERGOCALCIFEROL) 1.25 MG (50000 UNIT) PO CAPS: 50000.0000 [IU] | ORAL_CAPSULE | ORAL | 0 refills | Status: DC
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
## 2022-05-21 ENCOUNTER — Telehealth (INDEPENDENT_AMBULATORY_CARE_PROVIDER_SITE_OTHER): Payer: Managed Care, Other (non HMO) | Admitting: Psychology

## 2022-05-25 NOTE — Progress Notes (Signed)
Chief Complaint:   OBESITY Stacey Castaneda is here to discuss her progress with her obesity treatment plan along with follow-up of her obesity related diagnoses. Reona is on keeping a food journal and adhering to recommended goals of 1800 calories and 120 protein and states she is following her eating plan approximately 0% of the time. Ryana states she is exercising.  Today's visit was #: 8 Starting weight: 312 LBS Starting date: 12/03/2021 Today's weight: 294 LBS Today's date: 05/09/2022 Total lbs lost to date: 18 LBS Total lbs lost since last in-office visit: 3 LBS  Interim History: Patient has poor support at home.  She struggles with meal planning.  Patient prefers dietary logging versus category 3 meal plan.  Barriers to progress include poor support at home and feeling unmotivated to work on changes.  Subjective:   1. Vitamin D deficiency Discussed labs with patient today. Patient's vitamin D level is improving.  Vitamin D level went from 13.9 to 36.  Patient is taking prescription vitamin D 50,000 IU weekly.  2. Insulin resistance Worsening.  Discussed labs with patient today. Fasting insulin 29-30.4, despite 18 pounds of weight loss in 5 months.  Patient has carbs and sugar cravings and lack regular exercise.  Assessment/Plan:   1. Vitamin D deficiency Vitamin D goal level between 50-70.  Refill- Vitamin D, Ergocalciferol, (DRISDOL) 1.25 MG (50000 UNIT) CAPS capsule; Take 1 capsule (50,000 Units total) by mouth every 7 (seven) days.  Dispense: 12 capsule; Refill: 0  2. Insulin resistance Consider addition of metformin.  Continue to work on reducing refined carbs and added sugars.  3. Obesity,current BMI 50.5 1.  Discussed factors are clean eatz for dinner. 2.  Increase water intake to 100 ounces per day. 3.  Increase outdoor walking. 4.  Eating diet given to patient today.  Stacey Castaneda is currently in the action stage of change. As such, her goal is to continue with  weight loss efforts. She has agreed to keeping a food journal and adhering to recommended goals of 1800 calories and 120 protein daily.  Exercise goals:  Increase outdoor walking.  Behavioral modification strategies: increasing lean protein intake, increasing water intake, decreasing eating out, meal planning and cooking strategies, keeping healthy foods in the home, ways to avoid boredom eating, dealing with family or coworker sabotage, and planning for success.  Chau has agreed to follow-up with our clinic in 4 weeks. She was informed of the importance of frequent follow-up visits to maximize her success with intensive lifestyle modifications for her multiple health conditions.   Objective:   Blood pressure 119/79, pulse 96, temperature 98.9 F (37.2 C), height '5\' 4"'$  (1.626 m), weight 294 lb (133.4 kg), SpO2 98 %. Body mass index is 50.46 kg/m.  General: Cooperative, alert, well developed, in no acute distress. HEENT: Conjunctivae and lids unremarkable. Cardiovascular: Regular rhythm.  Lungs: Normal work of breathing. Neurologic: No focal deficits.   Lab Results  Component Value Date   CREATININE 0.70 12/03/2021   BUN 12 12/03/2021   NA 138 12/03/2021   K 4.5 12/03/2021   CL 102 12/03/2021   CO2 21 12/03/2021   Lab Results  Component Value Date   ALT 19 12/03/2021   AST 14 12/03/2021   ALKPHOS 83 12/03/2021   BILITOT 0.2 12/03/2021   Lab Results  Component Value Date   HGBA1C 5.6 12/03/2021   HGBA1C 4.9 05/21/2016   HGBA1C 5.4 10/14/2013   Lab Results  Component Value Date   INSULIN 30.4 (  H) 04/25/2022   Lab Results  Component Value Date   TSH 1.070 12/03/2021   Lab Results  Component Value Date   CHOL 189 12/03/2021   HDL 53 12/03/2021   LDLCALC 119 (H) 12/03/2021   LDLDIRECT 111 (H) 02/20/2012   TRIG 96 12/03/2021   CHOLHDL 3.6 12/03/2021   Lab Results  Component Value Date   VD25OH 13.9 (L) 12/03/2021   Lab Results  Component Value Date   WBC  9.0 12/03/2021   HGB 12.8 12/03/2021   HCT 38.6 12/03/2021   MCV 88 12/03/2021   PLT 282 12/03/2021   No results found for: "IRON", "TIBC", "FERRITIN"  Attestation Statements:   Reviewed by clinician on day of visit: allergies, medications, problem list, medical history, surgical history, family history, social history, and previous encounter notes.  I, Davy Pique, am acting as Location manager for Loyal Gambler, DO.  I have reviewed the above documentation for accuracy and completeness, and I agree with the above. Dell Ponto, DO

## 2022-05-30 ENCOUNTER — Ambulatory Visit: Payer: Managed Care, Other (non HMO)

## 2022-06-06 ENCOUNTER — Ambulatory Visit (INDEPENDENT_AMBULATORY_CARE_PROVIDER_SITE_OTHER): Payer: Managed Care, Other (non HMO) | Admitting: Physician Assistant

## 2022-06-10 ENCOUNTER — Telehealth (INDEPENDENT_AMBULATORY_CARE_PROVIDER_SITE_OTHER): Payer: Managed Care, Other (non HMO) | Admitting: Psychology

## 2022-06-10 DIAGNOSIS — F419 Anxiety disorder, unspecified: Secondary | ICD-10-CM

## 2022-06-10 DIAGNOSIS — F32A Depression, unspecified: Secondary | ICD-10-CM | POA: Diagnosis not present

## 2022-06-10 DIAGNOSIS — F5089 Other specified eating disorder: Secondary | ICD-10-CM
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
## 2022-06-16 ENCOUNTER — Encounter: Payer: Self-pay | Admitting: Family Medicine

## 2022-06-16 DIAGNOSIS — I10 Essential (primary) hypertension: Secondary | ICD-10-CM

## 2022-06-17 MED ORDER — SPIRONOLACTONE 25 MG PO TABS: 25.0000 mg | ORAL_TABLET | Freq: Every day | ORAL | 1 refills | Status: DC

## 2022-07-01 ENCOUNTER — Telehealth (INDEPENDENT_AMBULATORY_CARE_PROVIDER_SITE_OTHER): Payer: Self-pay | Admitting: Psychology

## 2022-07-01 ENCOUNTER — Telehealth (INDEPENDENT_AMBULATORY_CARE_PROVIDER_SITE_OTHER): Payer: Managed Care, Other (non HMO) | Admitting: Psychology

## 2022-07-01 DIAGNOSIS — F419 Anxiety disorder, unspecified: Secondary | ICD-10-CM

## 2022-07-01 DIAGNOSIS — F32A Depression, unspecified: Secondary | ICD-10-CM | POA: Diagnosis not present

## 2022-07-01 DIAGNOSIS — F5089 Other specified eating disorder: Secondary | ICD-10-CM | POA: Diagnosis not present

## 2022-07-01 NOTE — Progress Notes (Signed)
  Office: [phone]  /  Fax: [phone]    Date: July 01, 2022    Appointment Start Time: 2:33pm Duration: 29 minutes Provider: Glennie Isle, Psy.D. Type of Session: Individual Therapy  Location of Patient: Home (private location) Location of Provider: Provider's Home (private office) Type of Contact: Telepsychological Visit via MyChart Video Visit  Session Content:Stacey Castaneda is a 52 y.o. female presenting for a follow-up appointment to address the previously established treatment goal of increasing coping skills.Today's appointment was a telepsychological visit. Stacey Castaneda provided verbal consent for today's telepsychological appointment and she is aware she is responsible for securing confidentiality on her end of the session. Prior to proceeding with today's appointment, Stacey Castaneda's physical location at the time of this appointment was obtained as well a phone number she could be reached at in the event of technical difficulties. Stacey Castaneda and this provider participated in today's telepsychological service.   This provider conducted a brief check-in. Stacey Castaneda reported experiencing "ups and downs." Further explored and processed. She acknowledged engagement in emotional eating behaviors due to stress and irritation; however, denied engagement in binge eating behaviors. Of note, Stacey Castaneda provided verbal consent for this provider to re-send the previously shared handouts. Psychoeducation regarding the importance of self-care utilizing the oxygen mask metaphor was provided due to ongoing stressors. Psychoeducation regarding pleasurable activities, including its impact on emotional eating and overall well-being was also provided. Stacey Castaneda was provided with a handout with various options of pleasurable activities, and was encouraged to engage in one activity a day and additional activities as needed when triggered to emotionally eat. Stacey Castaneda agreed. Stacey Castaneda provided verbal consent during today's  appointment for this provider to send a handout with pleasurable activities via e-mail. Overall, Stacey Castaneda was receptive to today's appointment as evidenced by openness to sharing, responsiveness to feedback, and willingness to engage in pleasurable activities to assist with coping.  Mental Status Examination:  Appearance: neat Behavior: appropriate to circumstances Mood: neutral Affect: mood congruent Speech: WNL Eye Contact: appropriate Psychomotor Activity: WNL Gait: unable to assess Thought Process: linear, logical, and goal directed and no evidence or endorsement of suicidal, homicidal, and self-harm ideation, plan and intent  Thought Content/Perception: no hallucinations, delusions, bizarre thinking or behavior endorsed or observed Orientation: AAOx4 Memory/Concentration: memory, attention, language, and fund of knowledge intact  Insight: fair Judgment: fair  Interventions:  Conducted a brief chart review Provided empathic reflections and validation Employed supportive psychotherapy interventions to facilitate reduced distress and to improve coping skills with identified stressors Psychoeducation provided regarding pleasurable activities Psychoeducation provided regarding self-care Recommended/discussed options for longer-term therapeutic services  DSM-5 Diagnosis(es): F50.89 Other Specified Feeding or Eating Disorder, Emotional and Binge Eating Behaviors, F41.9 Unspecified Anxiety Disorder, and  F32.A Unspecified Depressive Disorder  Treatment Goal & Progress: During the initial appointment with this provider, the following treatment goal was established: increase coping skills. Progress is limited, as Stacey Castaneda has just begun treatment with this provider; however, she is receptive to the interaction and interventions and rapport is being established.   Plan: The next appointment is scheduled for 07/15/2022 at 2pm, which will be via [place] Visit. The next session will focus  on working towards the established treatment goal. Stacey Castaneda will establish care with a primary therapist.

## 2022-07-01 NOTE — Telephone Encounter (Signed)
  Office: [phone]  /  Fax: [phone]  Date of Call: July 01, 2022  Time of Call: 11:02am Provider: Glennie Isle, PsyD  CONTENT: This provider called [name] to check-in as she did not present for today's MyChart Video Visit appointment at 11am. A HIPAA compliant voicemail was left requesting a call back. Of note, this provider stayed on the MyChart Video Visit appointment for 5 minutes prior to signing off per the clinic's grace period policy.    PLAN: This provider will wait for Chesley to call back. No further follow-up planned by this provider.

## 2022-07-04 ENCOUNTER — Ambulatory Visit (INDEPENDENT_AMBULATORY_CARE_PROVIDER_SITE_OTHER): Payer: Managed Care, Other (non HMO) | Admitting: Internal Medicine

## 2022-07-04 ENCOUNTER — Other Ambulatory Visit (HOSPITAL_BASED_OUTPATIENT_CLINIC_OR_DEPARTMENT_OTHER): Payer: Self-pay

## 2022-07-04 ENCOUNTER — Encounter (INDEPENDENT_AMBULATORY_CARE_PROVIDER_SITE_OTHER): Payer: Self-pay | Admitting: Internal Medicine

## 2022-07-04 VITALS — BP 130/80 | HR 85 | Temp 98.3°F | Ht 64.0 in | Wt 297.0 lb

## 2022-07-04 DIAGNOSIS — E559 Vitamin D deficiency, unspecified: Secondary | ICD-10-CM

## 2022-07-04 DIAGNOSIS — E669 Obesity, unspecified: Secondary | ICD-10-CM | POA: Diagnosis not present

## 2022-07-04 DIAGNOSIS — Z6841 Body Mass Index (BMI) 40.0 and over, adult: Secondary | ICD-10-CM

## 2022-07-04 DIAGNOSIS — E88819 Insulin resistance, unspecified: Secondary | ICD-10-CM | POA: Diagnosis not present

## 2022-07-04 MED ORDER — METFORMIN HCL ER 500 MG PO TB24
500.0000 mg | ORAL_TABLET | Freq: Every day | ORAL | 0 refills | Status: DC
  Filled 2022-07-04: qty 30, 30d supply, fill #0

## 2022-07-04 NOTE — Assessment & Plan Note (Signed)
Most recent vitamin D levels  Lab Results  Component Value Date   VD25OH 13.9 (L) 12/03/2021     Deficiency state associated with adiposity and may result in leptin resistance, weight gain and fatigue. Currently on vitamin D supplementation without any adverse effects.  Plan: Check vitamin D levels at the next office visit.

## 2022-07-04 NOTE — Assessment & Plan Note (Signed)
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 Optimal level < 1.9. This is complex condition associated with genetics, ectopic fat and lifestyle factors. Insulin resistance may result in weight gain, abnormal cravings (particularly for carbs) and fatigue. This may result in additional weight gain and lead to pre-diabetes and diabetes if untreated.   Lab Results  Component Value Date   HGBA1C 5.6 12/03/2021   Lab Results  Component Value Date   INSULIN 30.4 (H) 04/25/2022   Lab Results  Component Value Date   GLUCOSE 105 (H) 12/03/2021   GLUCOSE 88 03/03/2010    We reviewed treatment options which include losing 7 to 10% of body weight, increasing physical activity.  She is also a candidate for incretin therapy.  After discussion of benefits and side effects she is agreeable to starting metformin XR 500 mg 1 tablet daily as advised by my colleague.

## 2022-07-04 NOTE — Progress Notes (Signed)
Office: [phone]  /  Fax: [phone]  WEIGHT SUMMARY AND BIOMETRICS  Vitals Temp: 98.3 F (36.8 C) BP: 130/80 Pulse Rate: 85 SpO2: 96 %   Anthropometric Measurements Height: '5\' 4"'$  (1.626 m) Weight: 297 lb (134.7 kg) BMI (Calculated): 50.95 Weight at Last Visit: 294 lb Weight Lost Since Last Visit: +3 lb Starting Weight: 312 lb Total Weight Loss (lbs): 15 lb (6.804 kg) Peak Weight: 313 lb   Body Composition  Body Fat %: 55.9 % Fat Mass (lbs): 166.2 lbs Muscle Mass (lbs): 124.4 lbs Visceral Fat Rating : 21   Other Clinical Data Fasting: No Labs: No Today's Visit #: 8 Starting Date: 12/03/21    HPI  Chief Complaint: OBESITY  Stacey Castaneda is here to discuss her progress with her obesity treatment plan. She is on the keeping a food journal and adhering to recommended goals of 1800 calories and 25-30 grams of  protein daily and states she is following her eating plan approximately 10 % of the time. She states she is not exercising.  Interval History:  This is my first encounter with Stacey Castaneda.  She is a pleasant 52 year old female affected by obesity.  She reports having long work hours and demanding job.  She works in Office manager.  She also notes having enticing foods at home which her husband has recently purchased.  She acknowledges that she needs to make changes and improve self-care but is having a hard time implementing these changes.  She has not been tracking or journaling calories. '[]'$ Denies '[x]'$ Reports problems with appetite and hunger signals.  '[x]'$ Denies '[]'$ Reports problems with satiety and satiation.  '[]'$ Denies '[x]'$ Reports problems with eating patterns and portion control.  '[]'$ Denies '[x]'$ Reports abnormal cravings Stress levels are reported as high and due to Work.  Barriers identified lack of time for self-care.   Pharmacotherapy for weight loss: She is currently taking no anti-obesity medication.   Weight promoting medications identified:  Steroids.  ASSESSMENT AND PLAN  TREATMENT PLAN FOR OBESITY:  Recommended Dietary Goals  Stacey Castaneda is currently in the action stage of change. As such, her goal is to continue weight management plan. She has agreed to: keeping a food journal and adhering to recommended goals of 1800 cal calories and 90 to 100 g protein.  Behavioral Intervention  We discussed the following Behavioral Modification Strategies today: increasing lean protein intake, decreasing simple carbohydrates , increasing vegetables, increasing lower glycemic fruits, increasing fiber rich foods, avoiding skipping meals, increasing water intake, work on meal planning and easy cooking plans, work on Interior and spatial designer calories using tracking App, decreasing eating out, consumption of processed foods, and making healthy choices when eating convenient foods, reading food labels , work on managing stress, creating time for self-care and relaxation measures, avoiding temptations and identifying enticing environmental cues, and dealing with family or coworker sabotage.  Additional resources provided today: NA  Recommended Physical Activity Goals  Stacey Castaneda has been advised to work up to 150 minutes of moderate intensity aerobic activity a week and strengthening exercises 2-3 times per week for cardiovascular health, weight loss maintenance and preservation of muscle mass.    Pharmacotherapy In addition to continue to work on nutritional and behavioral strategies she would benefit from pharmacotherapy.  After discussion of benefits and side effects as well as off label use she will be started on metformin XR 500 mg 1 tablet daily with the primary indication of insulin resistance.  ASSOCIATED CONDITIONS ADDRESSED TODAY  Insulin resistance Assessment & Plan: Her HOMA-IR is 7.88  which is significantly elevated. Optimal level < 1.9. This is complex condition associated with genetics, ectopic fat and lifestyle factors. Insulin  resistance may result in weight gain, abnormal cravings (particularly for carbs) and fatigue. This may result in additional weight gain and lead to pre-diabetes and diabetes if untreated.   Lab Results  Component Value Date   HGBA1C 5.6 12/03/2021   Lab Results  Component Value Date   INSULIN 30.4 (H) 04/25/2022   Lab Results  Component Value Date   GLUCOSE 105 (H) 12/03/2021   GLUCOSE 88 03/03/2010    We reviewed treatment options which include losing 7 to 10% of body weight, increasing physical activity.  She is also a candidate for incretin therapy.  After discussion of benefits and side effects she is agreeable to starting metformin XR 500 mg 1 tablet daily as advised by my colleague.   Orders: -     metFORMIN HCl ER; Take 1 tablet (500 mg total) by mouth daily with breakfast.  Dispense: 30 tablet; Refill: 0  Vitamin D deficiency Assessment & Plan: Most recent vitamin D levels  Lab Results  Component Value Date   VD25OH 13.9 (L) 12/03/2021     Deficiency state associated with adiposity and may result in leptin resistance, weight gain and fatigue. Currently on vitamin D supplementation without any adverse effects.  Plan: Check vitamin D levels at the next office visit.    Obesity,current BMI 50.5     PHYSICAL EXAM:  Blood pressure 130/80, pulse 85, temperature 98.3 F (36.8 C), height '5\' 4"'$  (1.626 m), weight 297 lb (134.7 kg), SpO2 96 %. Body mass index is 50.98 kg/m.  General: She is overweight, cooperative, alert, well developed, and in no acute distress. PSYCH: Has normal mood, affect and thought process.   HEENT: EOMI, sclerae are anicteric. Lungs: Normal breathing effort, no conversational dyspnea. Extremities: No edema.  Neurologic: No gross sensory or motor deficits. No tremors or fasciculations noted.    DIAGNOSTIC DATA REVIEWED:  BMET    Component Value Date/Time   NA 138 12/03/2021 1036   K 4.5 12/03/2021 1036   CL 102 12/03/2021 1036   CO2  21 12/03/2021 1036   GLUCOSE 105 (H) 12/03/2021 1036   GLUCOSE 82 06/29/2021 1544   BUN 12 12/03/2021 1036   CREATININE 0.70 12/03/2021 1036   CREATININE 0.65 06/29/2021 1544   CALCIUM 10.0 12/03/2021 1036   GFRNONAA 92 07/27/2020 1256   GFRAA 107 07/27/2020 1256   Lab Results  Component Value Date   HGBA1C 5.6 12/03/2021   HGBA1C 5.4 10/14/2013   Lab Results  Component Value Date   INSULIN 30.4 (H) 04/25/2022   Lab Results  Component Value Date   TSH 1.070 12/03/2021   CBC    Component Value Date/Time   WBC 9.0 12/03/2021 1036   WBC 8.9 01/26/2014 1515   RBC 4.37 12/03/2021 1036   RBC 4.80 01/26/2014 1515   HGB 12.8 12/03/2021 1036   HCT 38.6 12/03/2021 1036   PLT 282 12/03/2021 1036   MCV 88 12/03/2021 1036   MCH 29.3 12/03/2021 1036   MCH 29.4 01/26/2014 1515   MCHC 33.2 12/03/2021 1036   MCHC 34.4 01/26/2014 1515   RDW 12.7 12/03/2021 1036   Iron Studies No results found for: "IRON", "TIBC", "FERRITIN", "IRONPCTSAT" Lipid Panel     Component Value Date/Time   CHOL 189 12/03/2021 1036   TRIG 96 12/03/2021 1036   HDL 53 12/03/2021 1036   CHOLHDL 3.6 12/03/2021 1036  CHOLHDL 2.6 12/07/2020 0000   VLDL 21 05/21/2016 0802   LDLCALC 119 (H) 12/03/2021 1036   LDLCALC 86 12/07/2020 0000   LDLDIRECT 111 (H) 02/20/2012 0805   Hepatic Function Panel     Component Value Date/Time   PROT 7.1 12/03/2021 1036   ALBUMIN 4.2 12/03/2021 1036   AST 14 12/03/2021 1036   ALT 19 12/03/2021 1036   ALKPHOS 83 12/03/2021 1036   BILITOT 0.2 12/03/2021 1036      Component Value Date/Time   TSH 1.070 12/03/2021 1036   Nutritional Lab Results  Component Value Date   VD25OH 13.9 (L) 12/03/2021     Return in about 2 weeks (around 07/18/2022) for Dr. Valetta Close.. She was informed of the importance of frequent follow up visits to maximize her success with intensive lifestyle modifications for her multiple health conditions.   ATTESTASTION STATEMENTS:  Reviewed by  clinician on day of visit: allergies, medications, problem list, medical history, surgical history, family history, social history, and previous encounter notes.   Time spent on visit including pre-visit chart review and post-visit care and charting was 30 minutes.    Thomes Dinning, MD

## 2022-07-05 ENCOUNTER — Other Ambulatory Visit (HOSPITAL_BASED_OUTPATIENT_CLINIC_OR_DEPARTMENT_OTHER): Payer: Self-pay

## 2022-07-15 ENCOUNTER — Telehealth (INDEPENDENT_AMBULATORY_CARE_PROVIDER_SITE_OTHER): Payer: Managed Care, Other (non HMO) | Admitting: Psychology

## 2022-07-15 DIAGNOSIS — F419 Anxiety disorder, unspecified: Secondary | ICD-10-CM

## 2022-07-15 DIAGNOSIS — F32A Depression, unspecified: Secondary | ICD-10-CM | POA: Diagnosis not present

## 2022-07-15 DIAGNOSIS — F5089 Other specified eating disorder: Secondary | ICD-10-CM

## 2022-07-15 NOTE — Progress Notes (Signed)
  Office: [phone]  /  Fax: [phone]    Date: July 15, 2022    Appointment Start Time: 2:00pm Duration: 30 minutes Provider: Glennie Isle, Psy.D. Type of Session: Individual Therapy  Location of Patient: Home (private location) Location of Provider: Provider's Home (private office) Type of Contact: Telepsychological Visit via MyChart Video Visit  Session Content: Stacey Castaneda is a 52 y.o. female presenting for a follow-up appointment to address the previously established treatment goal of increasing coping skills.Today's appointment was a telepsychological visit. Stacey Castaneda provided verbal consent for today's telepsychological appointment and she is aware she is responsible for securing confidentiality on her end of the session. Prior to proceeding with today's appointment, Stacey Castaneda's physical location at the time of this appointment was obtained as well a phone number she could be reached at in the event of technical difficulties. Stacey Castaneda and this provider participated in today's telepsychological service.   This provider conducted a brief check-in. Stacey Castaneda reported engaging in self-care the "first few days" after the last appointment. Further explored and processed. Regarding eating habits, she noted, "I think I'm noticing an improvement." She shared some examples related to the aforementioned. Discussed the importance of eating regularly, including its impact on binge eating behaviors. She was engaged in problem solving to ensure she is eating regularly. Overall, Stacey Castaneda was receptive to today's appointment as evidenced by openness to sharing, responsiveness to feedback, and willingness to implement discussed strategies .  Mental Status Examination:  Appearance: neat Behavior: appropriate to circumstances Mood: neutral Affect: mood congruent Speech: WNL Eye Contact: appropriate Psychomotor Activity: WNL Gait: unable to assess Thought Process: linear, logical, and goal directed and  no evidence or endorsement of suicidal, homicidal, and self-harm ideation, plan and intent  Thought Content/Perception: no hallucinations, delusions, bizarre thinking or behavior endorsed or observed Orientation: AAOx4 Memory/Concentration: memory, attention, language, and fund of knowledge intact  Insight: fair Judgment: fair  Interventions:  Conducted a brief chart review Provided empathic reflections and validation Reviewed content from the previous session Provided positive reinforcement Employed supportive psychotherapy interventions to facilitate reduced distress and to improve coping skills with identified stressors Engaged patient in problem solving  DSM-5 Diagnosis(es):  F50.89 Other Specified Feeding or Eating Disorder, Emotional and Binge Eating Behaviors, F41.9 Unspecified Anxiety Disorder, and  F32.A Unspecified Depressive Disorder  Treatment Goal & Progress: During the initial appointment with this provider, the following treatment goal was established: increase coping skills. Stacey Castaneda has demonstrated progress in her goal as evidenced by increased awareness of hunger patterns. Stacey Castaneda also continues to demonstrate willingness to engage in learned skill(s).  Plan: The next appointment is scheduled for 07/29/2022 at 11am, which will be via MyChart Video Visit. The next session will focus on working towards the established treatment goal.

## 2022-07-29 ENCOUNTER — Telehealth (INDEPENDENT_AMBULATORY_CARE_PROVIDER_SITE_OTHER): Payer: Managed Care, Other (non HMO) | Admitting: Psychology

## 2022-07-29 DIAGNOSIS — F419 Anxiety disorder, unspecified: Secondary | ICD-10-CM | POA: Diagnosis not present

## 2022-07-29 DIAGNOSIS — F5089 Other specified eating disorder: Secondary | ICD-10-CM

## 2022-07-29 DIAGNOSIS — F32A Depression, unspecified: Secondary | ICD-10-CM

## 2022-07-29 NOTE — Progress Notes (Signed)
  Office: [phone]  /  Fax: [phone]    Date: July 29, 2022    Appointment Start Time: 11:01am Duration: 33 minutes Provider: Glennie Isle, Psy.D. Type of Session: Individual Therapy  Location of Patient: Home (private location) Location of Provider: Provider's Home (private office) Type of Contact: Telepsychological Visit via MyChart Video Visit  Session Content: Taylinn is a 52 y.o. female presenting for a follow-up appointment to address the previously established treatment goal of increasing coping skills.Today's appointment was a telepsychological visit. Tanaysia provided verbal consent for today's telepsychological appointment and she is aware she is responsible for securing confidentiality on her end of the session. Prior to proceeding with today's appointment, Ajani's physical location at the time of this appointment was obtained as well a phone number she could be reached at in the event of technical difficulties. Kyleigh and this provider participated in today's telepsychological service.   This provider conducted a brief check-in. Shavanna indicated a reduction in work-related stressors. She continues to report an improvement in eating habits, specifically protein intake. Babe also discussed implementing discussed strategies and engaging in pleasurable activities. She further discussed a reduction in emotional/binge eating behaviors. Positive reinforcement was provided. Psychoeducation regarding triggers for emotional eating was provided to further assist with coping. Tonishia was provided a handout, and encouraged to utilize the handout between now and the next appointment to increase awareness of triggers and frequency. [name] agreed. This provider also discussed behavioral strategies for specific triggers, such as placing the utensil down when conversing to avoid mindless eating. Daresha provided verbal consent during today's appointment for this provider to send a  handout about triggers via e-mail. Overall, Meckenzie was receptive to today's appointment as evidenced by openness to sharing, responsiveness to feedback, and willingness to explore triggers for emotional eating.  Mental Status Examination:  Appearance: neat Behavior: appropriate to circumstances Mood: neutral Affect: mood congruent Speech: WNL Eye Contact: appropriate Psychomotor Activity: WNL Gait: unable to assess Thought Process: linear, logical, and goal directed and no evidence or endorsement of suicidal, homicidal, and self-harm ideation, plan and intent  Thought Content/Perception: no hallucinations, delusions, bizarre thinking or behavior endorsed or observed Orientation: AAOx4 Memory/Concentration: memory, attention, language, and fund of knowledge intact  Insight: fair Judgment: fair  Interventions:  Conducted a brief chart review Provided empathic reflections and validation Reviewed content from the previous session Provided positive reinforcement Employed supportive psychotherapy interventions to facilitate reduced distress and to improve coping skills with identified stressors Psychoeducation provided regarding triggers for emotional eating behaviors  DSM-5 Diagnosis(es):  F50.89 Other Specified Feeding or Eating Disorder, Emotional and Binge Eating Behaviors, F41.9 Unspecified Anxiety Disorder, and  F32.A Unspecified Depressive Disorder  Treatment Goal & Progress: During the initial appointment with this provider, the following treatment goal was established: increase coping skills. Shambre has demonstrated progress in her goal as evidenced by increased awareness of hunger patterns. Jaeonna also demonstrates willingness to continue engaging in pleasurable activities.  Plan: The next appointment is scheduled for 08/20/2022 at 11am, which will be via MyChart Video Visit. The next session will focus on working towards the established treatment goal. Additionally, Francess  noted she will follow-up on the referrals list.

## 2022-07-30 ENCOUNTER — Other Ambulatory Visit (HOSPITAL_BASED_OUTPATIENT_CLINIC_OR_DEPARTMENT_OTHER): Payer: Self-pay

## 2022-07-30 ENCOUNTER — Encounter (INDEPENDENT_AMBULATORY_CARE_PROVIDER_SITE_OTHER): Payer: Self-pay

## 2022-07-30 DIAGNOSIS — E88819 Insulin resistance, unspecified: Secondary | ICD-10-CM

## 2022-07-30 MED ORDER — METFORMIN HCL ER 500 MG PO TB24
500.0000 mg | ORAL_TABLET | Freq: Every day | ORAL | 0 refills | Status: DC
  Filled 2022-07-30: qty 30, 30d supply, fill #0

## 2022-08-02 ENCOUNTER — Other Ambulatory Visit (HOSPITAL_BASED_OUTPATIENT_CLINIC_OR_DEPARTMENT_OTHER): Payer: Self-pay

## 2022-08-05 ENCOUNTER — Other Ambulatory Visit: Payer: Self-pay | Admitting: Family Medicine

## 2022-08-19 ENCOUNTER — Encounter (INDEPENDENT_AMBULATORY_CARE_PROVIDER_SITE_OTHER): Payer: Self-pay | Admitting: Family Medicine

## 2022-08-19 ENCOUNTER — Ambulatory Visit (INDEPENDENT_AMBULATORY_CARE_PROVIDER_SITE_OTHER): Payer: Managed Care, Other (non HMO) | Admitting: Family Medicine

## 2022-08-19 VITALS — BP 104/71 | HR 92 | Temp 98.9°F | Ht 64.0 in | Wt 296.0 lb

## 2022-08-19 DIAGNOSIS — E669 Obesity, unspecified: Secondary | ICD-10-CM

## 2022-08-19 DIAGNOSIS — Z6841 Body Mass Index (BMI) 40.0 and over, adult: Secondary | ICD-10-CM

## 2022-08-19 DIAGNOSIS — E88819 Insulin resistance, unspecified: Secondary | ICD-10-CM | POA: Diagnosis not present

## 2022-08-19 DIAGNOSIS — E559 Vitamin D deficiency, unspecified: Secondary | ICD-10-CM

## 2022-08-19 DIAGNOSIS — I10 Essential (primary) hypertension: Secondary | ICD-10-CM

## 2022-08-19 MED ORDER — VITAMIN D (ERGOCALCIFEROL) 1.25 MG (50000 UNIT) PO CAPS
50000.0000 [IU] | ORAL_CAPSULE | ORAL | 0 refills | Status: DC
Start: 2022-08-19 — End: 2022-10-24

## 2022-08-19 MED ORDER — METFORMIN HCL ER 500 MG PO TB24
500.0000 mg | ORAL_TABLET | Freq: Every day | ORAL | 0 refills | Status: DC
Start: 2022-08-19 — End: 2022-10-24

## 2022-08-19 NOTE — Assessment & Plan Note (Signed)
Last vitamin D Lab Results  Component Value Date   VD25OH 13.9 (L) 12/03/2021

## 2022-08-19 NOTE — Assessment & Plan Note (Signed)
Last fasting insulin was high at 30.4 She is doing well with the new start of metformin XR 500 mg  Denies GI side effects Doing better with dietary changes and plans to increase physical activity  Continue metformin XR 500 mg daily.  Recheck CMP today.

## 2022-08-19 NOTE — Progress Notes (Unsigned)
Office: [phone]  /  Fax: [phone]  WEIGHT SUMMARY AND BIOMETRICS  Starting Date: 12/03/21  Starting Weight: 312lb   Weight Lost Since Last Visit: 1lb   Vitals Temp: 98.9 F (37.2 C) BP: 104/71 Pulse Rate: 92 SpO2: 97 %   Body Composition  Body Fat %: 55.6 % Fat Mass (lbs): 164.6 lbs Muscle Mass (lbs): 124.8 lbs Total Body Water (lbs): 99 lbs Visceral Fat Rating : 21     HPI  Chief Complaint: OBESITY  Stacey Castaneda is here to discuss her progress with her obesity treatment plan. She is on the keeping a food journal and adhering to recommended goals of 1800 calories and 90-100 protein and states she is following her eating plan approximately 70 % of the time. She states she is walking every other week.   Interval History:  Since last office visit she is down 1 lb Her net weight is 16 lb in 8 mos Her husband is no longer bringing in junk food She is stocking up on protein bars She has increased her water intake Trying to not skip meals and is tracking steps and logging her calories She has walked outside a few times but her allergies are bothering her She is seeing Dr Dewaine Conger and is working on mindfulness.  She is looking into seeing an outside therapist  Pharmacotherapy: none  PHYSICAL EXAM:  Blood pressure 104/71, pulse 92, temperature 98.9 F (37.2 C), height 5\' 4"  (1.626 m), weight 296 lb (134.3 kg), SpO2 97 %. Body mass index is 50.81 kg/m.  General: She is overweight, cooperative, alert, well developed, and in no acute distress. PSYCH: Has normal mood, affect and thought process.   Lungs: Normal breathing effort, no conversational dyspnea.   ASSESSMENT AND PLAN  TREATMENT PLAN FOR OBESITY:  Recommended Dietary Goals  Cornisha is currently in the action stage of change. As such, her goal is to continue weight management plan. She has agreed to keeping a food journal and adhering to recommended goals of 1800 calories and 100g  protein.  Behavioral Intervention  We discussed the following Behavioral Modification Strategies today: increasing lean protein intake, decreasing simple carbohydrates , increasing vegetables, increasing lower glycemic fruits, increasing fiber rich foods, avoiding skipping meals, increasing water intake, work on meal planning and preparation, work on Counselling psychologist calories using tracking application, reading food labels , emotional eating strategies and understanding the difference between hunger signals and cravings, and planning for success.  Additional resources provided today: NA  Recommended Physical Activity Goals  Jeylah has been advised to work up to 150 minutes of moderate intensity aerobic activity a week and strengthening exercises 2-3 times per week for cardiovascular health, weight loss maintenance and preservation of muscle mass.   She has agreed to Think about ways to increase physical activity  Pharmacotherapy changes for the treatment of obesity:   ASSOCIATED CONDITIONS ADDRESSED TODAY  Insulin resistance  Vitamin D deficiency      She was informed of the importance of frequent follow up visits to maximize her success with intensive lifestyle modifications for her multiple health conditions.   ATTESTASTION STATEMENTS:  Reviewed by clinician on day of visit: allergies, medications, problem list, medical history, surgical history, family history, social history, and previous encounter notes pertinent to obesity diagnosis.   I have personally spent 30 minutes total time today in preparation, patient care, nutritional counseling and documentation for this visit, including the following: review of clinical lab tests; review of medical tests/procedures/services.  Dell Ponto, DO DABFM, DABOM [facility] [address] [place], [place] [address] [phone]

## 2022-08-20 ENCOUNTER — Telehealth (INDEPENDENT_AMBULATORY_CARE_PROVIDER_SITE_OTHER): Payer: Managed Care, Other (non HMO) | Admitting: Psychology

## 2022-08-20 DIAGNOSIS — F32A Depression, unspecified: Secondary | ICD-10-CM | POA: Diagnosis not present

## 2022-08-20 DIAGNOSIS — F419 Anxiety disorder, unspecified: Secondary | ICD-10-CM

## 2022-08-20 DIAGNOSIS — F5089 Other specified eating disorder: Secondary | ICD-10-CM

## 2022-08-20 LAB — COMPREHENSIVE METABOLIC PANEL
ALT: 19 IU/L (ref 0–32)
AST: 20 IU/L (ref 0–40)
Albumin/Globulin Ratio: 1.6 (ref 1.2–2.2)
Albumin: 4.2 g/dL (ref 3.8–4.9)
Alkaline Phosphatase: 77 IU/L (ref 44–121)
BUN/Creatinine Ratio: 20 (ref 9–23)
BUN: 13 mg/dL (ref 6–24)
Bilirubin Total: 0.4 mg/dL (ref 0.0–1.2)
CO2: 23 mmol/L (ref 20–29)
Calcium: 9.3 mg/dL (ref 8.7–10.2)
Chloride: 99 mmol/L (ref 96–106)
Creatinine, Ser: 0.65 mg/dL (ref 0.57–1.00)
Globulin, Total: 2.6 g/dL (ref 1.5–4.5)
Glucose: 86 mg/dL (ref 70–99)
Potassium: 4.5 mmol/L (ref 3.5–5.2)
Sodium: 136 mmol/L (ref 134–144)
Total Protein: 6.8 g/dL (ref 6.0–8.5)
eGFR: 107 mL/min/{1.73_m2} (ref >59)

## 2022-08-20 LAB — VITAMIN D 25 HYDROXY (VIT D DEFICIENCY, FRACTURES): Vit D, 25-Hydroxy: 36.4 ng/mL (ref 30.0–100.0)

## 2022-08-20 NOTE — Progress Notes (Signed)
  Office: [phone]  /  Fax: [phone]    Date: August 20, 2022    Appointment Start Time: 11:01am Duration: 29 minutes Provider: Lawerance Cruel, Psy.D. Type of Session: Individual Therapy  Location of Patient: Home (private location) Location of Provider: Provider's Home (private office) Type of Contact: Telepsychological Visit via MyChart Video Visit  Session Content: Stacey Castaneda is a 52 y.o. female presenting for a follow-up appointment to address the previously established treatment goal of increasing coping skills.Today's appointment was a telepsychological visit. Stacey Castaneda provided verbal consent for today's telepsychological appointment and she is aware she is responsible for securing confidentiality on her end of the session. Prior to proceeding with today's appointment, Stacey Castaneda's physical location at the time of this appointment was obtained as well a phone number she could be reached at in the event of technical difficulties. Stacey Castaneda and this provider participated in today's telepsychological service.   This provider conducted a brief check-in. Stacey Castaneda shared about recent events. She expressed desire to "hold off" initiating therapeutic services due to multiple appointments. Regarding eating habits, she reported implementing discussed strategies and discussed a reduction in emotional and binge eating behaviors. Positive reinforcement was provided. Stacey Castaneda was engaged in problem solving to develop a plan to help cope with urges/cravings involving activities to relax, activities to distract, comforting places, people to call and connect with, and activities that help soothe senses. She was observed writing the plan. Overall, Stacey Castaneda was receptive to today's appointment as evidenced by openness to sharing, responsiveness to feedback, and willingness to implement discussed strategies .  Mental Status Examination:  Appearance: neat Behavior: appropriate to circumstances Mood:  neutral Affect: mood congruent Speech: WNL Eye Contact: appropriate Psychomotor Activity: WNL Gait: unable to assess Thought Process: linear, logical, and goal directed and no evidence or endorsement of suicidal, homicidal, and self-harm ideation, plan and intent  Thought Content/Perception: no hallucinations, delusions, bizarre thinking or behavior endorsed or observed Orientation: AAOx4 Memory/Concentration: memory, attention, language, and fund of knowledge intact  Insight: good Judgment: good  Interventions:  Conducted a brief chart review Provided empathic reflections and validation Reviewed content from the previous session Provided positive reinforcement Employed supportive psychotherapy interventions to facilitate reduced distress and to improve coping skills with identified stressors Engaged patient in problem solving  DSM-5 Diagnosis(es):  F50.89 Other Specified Feeding or Eating Disorder, Emotional and Binge Eating Behaviors, F41.9 Unspecified Anxiety Disorder, and  F32.A Unspecified Depressive Disorder  Treatment Goal & Progress: During the initial appointment with this provider, the following treatment goal was established: increase coping skills. Stacey Castaneda has demonstrated progress in her goal as evidenced by increased awareness of hunger patterns, increased awareness of triggers for emotional eating behaviors, and reduction in emotional and binge eating behaviors . Stacey Castaneda also continues to demonstrate willingness to engage in learned skill(s).  Plan: The next appointment is scheduled for 09/09/2022 at 8am, which will be via MyChart Video Visit. The next session will focus on working towards the established treatment goal.

## 2022-09-09 ENCOUNTER — Telehealth (INDEPENDENT_AMBULATORY_CARE_PROVIDER_SITE_OTHER): Payer: Managed Care, Other (non HMO) | Admitting: Psychology

## 2022-09-09 DIAGNOSIS — F5089 Other specified eating disorder: Secondary | ICD-10-CM | POA: Diagnosis not present

## 2022-09-09 DIAGNOSIS — F32A Depression, unspecified: Secondary | ICD-10-CM | POA: Diagnosis not present

## 2022-09-09 DIAGNOSIS — F419 Anxiety disorder, unspecified: Secondary | ICD-10-CM

## 2022-09-09 NOTE — Progress Notes (Signed)
  Office: [phone]  /  Fax: [phone]    Date: Sep 09, 2022    Appointment Start Time: 8:00am Duration: 33 minutes Provider: Lawerance Cruel, Psy.D. Type of Session: Individual Therapy  Location of Patient: Home (private location) Location of Provider: Provider's Home (private office) Type of Contact: Telepsychological Visit via MyChart Video Visit  Session Content: Stacey Castaneda is a 52 y.o. female presenting for a follow-up appointment to address the previously established treatment goal of increasing coping skills.Today's appointment was a telepsychological visit. Stacey Castaneda provided verbal consent for today's telepsychological appointment and she is aware she is responsible for securing confidentiality on her end of the session. Prior to proceeding with today's appointment, Stacey Castaneda's physical location at the time of this appointment was obtained as well a phone number she could be reached at in the event of technical difficulties. Stacey Castaneda and this provider participated in today's telepsychological service.   This provider conducted a brief check-in. [name] shared about recent events, including plans for her birthday. She discussed engaging in self-care and acknowledged deviations for her birthday celebrations, adding she was mindful overall. Additionally, Stacey Castaneda shared plans to increase physical activity. Notably, she discussed feeling anxious about the amount of food in her refrigerator/pantry. Further explored and processed. She was engaged in problem solving to help address the aforementioned. Overall, Stacey Castaneda was receptive to today's appointment as evidenced by openness to sharing, responsiveness to feedback, and willingness to implement discussed strategies .  Mental Status Examination:  Appearance: neat Behavior: appropriate to circumstances Mood: neutral Affect: mood congruent Speech: WNL Eye Contact: appropriate Psychomotor Activity: WNL Gait: unable to assess Thought  Process: linear, logical, and goal directed and no evidence or endorsement of suicidal, homicidal, and self-harm ideation, plan and intent  Thought Content/Perception: no hallucinations, delusions, bizarre thinking or behavior endorsed or observed Orientation: AAOx4 Memory/Concentration: memory, attention, language, and fund of knowledge intact  Insight: good Judgment: good  Interventions:  Conducted a brief chart review Provided empathic reflections and validation Provided positive reinforcement Employed supportive psychotherapy interventions to facilitate reduced distress and to improve coping skills with identified stressors Engaged patient in problem solving  DSM-5 Diagnosis(es):  F50.89 Other Specified Feeding or Eating Disorder, Emotional and Binge Eating Behaviors, F41.9 Unspecified Anxiety Disorder, and  F32.A Unspecified Depressive Disorder  Treatment Goal & Progress: During the initial appointment with this provider, the following treatment goal was established: increase coping skills. Stacey Castaneda has demonstrated progress in her goal as evidenced by increased awareness of hunger patterns, increased awareness of triggers for emotional eating behaviors, and reduction in emotional and binge eating behaviors. Stacey Castaneda also continues to demonstrate willingness to engage in learned skill(s).   Plan: The next appointment is scheduled for 09/23/2022 at 8am, which will be via MyChart Video Visit. The next session will focus on working towards the established treatment goal.

## 2022-09-19 ENCOUNTER — Encounter (INDEPENDENT_AMBULATORY_CARE_PROVIDER_SITE_OTHER): Payer: Self-pay | Admitting: Family Medicine

## 2022-09-19 ENCOUNTER — Ambulatory Visit (INDEPENDENT_AMBULATORY_CARE_PROVIDER_SITE_OTHER): Payer: Managed Care, Other (non HMO) | Admitting: Family Medicine

## 2022-09-19 VITALS — BP 122/78 | HR 88 | Temp 98.6°F | Ht 64.0 in | Wt 297.0 lb

## 2022-09-19 DIAGNOSIS — E88819 Insulin resistance, unspecified: Secondary | ICD-10-CM

## 2022-09-19 DIAGNOSIS — E559 Vitamin D deficiency, unspecified: Secondary | ICD-10-CM

## 2022-09-19 DIAGNOSIS — F419 Anxiety disorder, unspecified: Secondary | ICD-10-CM | POA: Diagnosis not present

## 2022-09-19 DIAGNOSIS — F32A Depression, unspecified: Secondary | ICD-10-CM

## 2022-09-19 DIAGNOSIS — Z6841 Body Mass Index (BMI) 40.0 and over, adult: Secondary | ICD-10-CM

## 2022-09-19 NOTE — Assessment & Plan Note (Signed)
High stress, emotional eating, lack of support and a hx of depression and anxiety continue to make it difficult to follow through with recommended lifestyle changes.  She is on Wellbutrin XL 150 mg daily and Zoloft 100 mg daily.   Her motivation to continue to challenge herself to improve has been good.  Continue counseling. Continue to focus on proper sleep at night, good nutrition, self care and mindfulness

## 2022-09-19 NOTE — Progress Notes (Signed)
Office: [phone]  /  Fax: [phone]  WEIGHT SUMMARY AND BIOMETRICS  Starting Date: 12/03/21  Starting Weight: 312 lb   Weight Lost Since Last Visit: 0   Vitals Temp: 98.6 F (37 C) BP: 122/78 Pulse Rate: 88 SpO2: 94 %   Body Composition  Body Fat %: 56.4 % Fat Mass (lbs): 168 lbs Muscle Mass (lbs): 123.2 lbs Visceral Fat Rating : 21   HPI  Chief Complaint: OBESITY  Maedean is here to discuss her progress with her obesity treatment plan. She is on the keeping a food journal and adhering to recommended goals of 1800 calories and 120+ protein and states she is following her eating plan approximately 10 % of the time. She states she is not exercising.   Interval History:  Since last office visit she is up 1 lb She is down 15 lb in 9 mos She is not logging her dietary intake She did get a standing desk, not using it yet She admits to getting off track with meal planning and healthy food choices She is grazing more on snack foods at night on peanut M&Ms She is not binge eating She is getting in more cucumbers and tomatoes Sometimes skips breakfast  She is trying out easy items like beef slicks, pre boiled eggs, laughing cow cheese + veggies She is in the contemplative phase of change for exercise   Pharmacotherapy: metformin XR 500 mg daily  PHYSICAL EXAM:  Blood pressure 122/78, pulse 88, temperature 98.6 F (37 C), height 5\' 4"  (1.626 m), weight 297 lb (134.7 kg), SpO2 94 %. Body mass index is 50.98 kg/m.  General: She is overweight, cooperative, alert, well developed, and in no acute distress. PSYCH: Has normal mood, affect and thought process.   Lungs: Normal breathing effort, no conversational dyspnea.   ASSESSMENT AND PLAN  TREATMENT PLAN FOR OBESITY:  Recommended Dietary Goals  Dagny is currently in the action stage of change. As such, her goal is to continue weight management plan. She has agreed to keeping a food journal and  adhering to recommended goals of 1800 calories and 100 g protein.  Behavioral Intervention  We discussed the following Behavioral Modification Strategies today: increasing lean protein intake, decreasing simple carbohydrates , increasing vegetables, increasing lower glycemic fruits, increasing water intake, work on meal planning and preparation, keeping healthy foods at home, work on managing stress, creating time for self-care and relaxation measures, continue to practice mindfulness when eating, and planning for success.  Additional resources provided today: NA  Recommended Physical Activity Goals  Kadesia has been advised to work up to 150 minutes of moderate intensity aerobic activity a week and strengthening exercises 2-3 times per week for cardiovascular health, weight loss maintenance and preservation of muscle mass.   She has agreed to [employer] strengthening exercises with a goal of 2-3 sessions a week   Pharmacotherapy changes for the treatment of obesity:   ASSOCIATED CONDITIONS ADDRESSED TODAY  Vitamin D deficiency Assessment & Plan: Last vitamin D Lab Results  Component Value Date   VD25OH 36.4 08/19/2022   She is doing well on RX vitamin D 50,000 IU weekly.  Reviewed labs from last visit Energy level is starting to improve.  Denies adverse SE  Continue RX vitamin D 50,000 IU weekly.  Recheck level in 3 mos   BMI 50.0-59.9, adult (HCC)  Morbid obesity (HCC) with starting BMI 53 Assessment & Plan: Reviewed patient's overall progress She is down 15 lb in 9 mos of medically  supervised weight management This is a 4.8% TBW loss She is motivated to continue working on healthy lifestyle changes She lacks coverage for anti obesity medications She has declined the option for bariatric surgery at this point  Resume 1800 cal/ day diet with dietary logging, tracking protein (100+ g/ day), eating on a schedule, meal planning and keeping excess starches and sweets to a minimum.   She is motivated to increase her level of physical activity.   Insulin resistance Assessment & Plan: Last fasting insulin elevated 30.4 She has not presented fasting to recheck Doing well on metformin XR 500 mg once daily CMP reviewed from 4/15 with patient today  She is actively working on reducing her intake of starches and sweets Regular exercise is lacking She has lost 15 lb in the past 9 mos  Plan: read labels on food and drink for sugar.  Limit products with over 8 g of sugar per serving.  Increase home exercise time with chair yoga, resistance bands (given) and short walks to improve insulin sensitivity.  Consider increasing metformin dose.  Recheck fasting insulin in Aug.   Anxiety and depression Assessment & Plan: High stress, emotional eating, lack of support and a hx of depression and anxiety continue to make it difficult to follow through with recommended lifestyle changes.  She is on Wellbutrin XL 150 mg daily and Zoloft 100 mg daily.   Her motivation to continue to challenge herself to improve has been good.  Continue counseling. Continue to focus on proper sleep at night, good nutrition, self care and mindfulness       She was informed of the importance of frequent follow up visits to maximize her success with intensive lifestyle modifications for her multiple health conditions.   ATTESTASTION STATEMENTS:  Reviewed by clinician on day of visit: allergies, medications, problem list, medical history, surgical history, family history, social history, and previous encounter notes pertinent to obesity diagnosis.   I have personally spent 30 minutes total time today in preparation, patient care, nutritional counseling and documentation for this visit, including the following: review of clinical lab tests; review of medical tests/procedures/services.      Glennis Brink, DO DABFM, DABOM [facility] [address] [place], [place] [address] [phone]

## 2022-09-19 NOTE — Assessment & Plan Note (Signed)
Last fasting insulin elevated 30.4 She has not presented fasting to recheck Doing well on metformin XR 500 mg once daily CMP reviewed from 4/15 with patient today  She is actively working on reducing her intake of starches and sweets Regular exercise is lacking She has lost 15 lb in the past 9 mos  Plan: read labels on food and drink for sugar.  Limit products with over 8 g of sugar per serving.  Increase home exercise time with chair yoga, resistance bands (given) and short walks to improve insulin sensitivity.  Consider increasing metformin dose.  Recheck fasting insulin in Aug.

## 2022-09-19 NOTE — Assessment & Plan Note (Signed)
Reviewed patient's overall progress She is down 15 lb in 9 mos of medically supervised weight management This is a 4.8% TBW loss She is motivated to continue working on healthy lifestyle changes She lacks coverage for anti obesity medications She has declined the option for bariatric surgery at this point  Resume 1800 cal/ day diet with dietary logging, tracking protein (100+ g/ day), eating on a schedule, meal planning and keeping excess starches and sweets to a minimum.  She is motivated to increase her level of physical activity.

## 2022-09-19 NOTE — Assessment & Plan Note (Signed)
Last vitamin D Lab Results  Component Value Date   VD25OH 36.4 08/19/2022   She is doing well on RX vitamin D 50,000 IU weekly.  Reviewed labs from last visit Energy level is starting to improve.  Denies adverse SE  Continue RX vitamin D 50,000 IU weekly.  Recheck level in 3 mos

## 2022-09-20 ENCOUNTER — Ambulatory Visit: Payer: Managed Care, Other (non HMO) | Admitting: Family Medicine

## 2022-09-20 VITALS — BP 132/71 | HR 97

## 2022-09-20 DIAGNOSIS — Z23 Encounter for immunization: Secondary | ICD-10-CM

## 2022-09-20 DIAGNOSIS — F418 Other specified anxiety disorders: Secondary | ICD-10-CM

## 2022-09-20 DIAGNOSIS — Z1231 Encounter for screening mammogram for malignant neoplasm of breast: Secondary | ICD-10-CM

## 2022-09-20 DIAGNOSIS — G4733 Obstructive sleep apnea (adult) (pediatric): Secondary | ICD-10-CM

## 2022-09-20 DIAGNOSIS — E042 Nontoxic multinodular goiter: Secondary | ICD-10-CM | POA: Diagnosis not present

## 2022-09-20 DIAGNOSIS — I10 Essential (primary) hypertension: Secondary | ICD-10-CM | POA: Diagnosis not present

## 2022-09-20 DIAGNOSIS — E88819 Insulin resistance, unspecified: Secondary | ICD-10-CM

## 2022-09-20 NOTE — Assessment & Plan Note (Signed)
Due ot recheck [place], has been almost 6 years.

## 2022-09-20 NOTE — Assessment & Plan Note (Signed)
Doing well on current regimen.  Feels has had more good/positive days than not.

## 2022-09-20 NOTE — Assessment & Plan Note (Signed)
Well controlled. Continue current regimen. Follow up in  6 mo  

## 2022-09-20 NOTE — Progress Notes (Signed)
   Established Patient Office Visit  Subjective   Patient ID: Stacey Castaneda, female    DOB: Nov 09, 1970  Age: 52 y.o. MRN: [id number]  Chief Complaint  Patient presents with   Hypertension    HPI  Follow-up mood-she is currently on sertraline and doing well.  She feels like she has had more good days than not.  Even though it has not been perfect.  She still had some stressors with work.  Is following with healthy weight and wellness and has been doing well.  She just got a sit/stand desk.  She is trying to stay more active.   was recently started on metformin and has been tolerating it well.  She feels like it has been several years since her last thyroid ultrasound and would like to make sure that that is up-to-date.    ROS    Objective:     BP 132/71   Pulse 97   SpO2 97%    Physical Exam Vitals and nursing note reviewed.  Constitutional:      Appearance: She is well-developed.  HENT:     Head: Normocephalic and atraumatic.  Cardiovascular:     Rate and Rhythm: Normal rate and regular rhythm.     Heart sounds: Normal heart sounds.  Pulmonary:     Effort: Pulmonary effort is normal.     Breath sounds: Normal breath sounds.  Skin:    General: Skin is warm and dry.  Neurological:     Mental Status: She is alert and oriented to person, place, and time.  Psychiatric:        Behavior: Behavior normal.     No results found for any visits on 09/20/22.    The 10-year ASCVD risk score (Arnett DK, et al., 2019) is: 2.1%    Assessment & Plan:   Problem List Items Addressed This Visit       Cardiovascular and Mediastinum   Essential hypertension - Primary    Well controlled. Continue current regimen. Follow up in  45mo         Respiratory   OSA on CPAP     Endocrine   Multinodular non-toxic goiter    Due ot recheck [place], has been almost 6 years.       Relevant Orders   US THYROID   Insulin resistance    Now on mefformi n and doing well with it.           Other   Depression with anxiety    Doing well on current regimen.  Feels has had more good/positive days than not.        Other Visit Diagnoses     Screening mammogram for breast cancer       Relevant Orders   MM 3D SCREENING MAMMOGRAM BILATERAL BREAST   Encounter for immunization       Relevant Orders   Varicella-zoster vaccine IM (Completed)       Shingrix given today.    Plan to schedule mammo  Return in about 6 months (around 03/23/2023) for Mood and 2nd Shingrix vaccine.    Nani Gasser, MD

## 2022-09-20 NOTE — Patient Instructions (Signed)
Shingrix IM given today.

## 2022-09-20 NOTE — Assessment & Plan Note (Signed)
Now on mefformi n and doing well with it.

## 2022-09-23 ENCOUNTER — Telehealth (INDEPENDENT_AMBULATORY_CARE_PROVIDER_SITE_OTHER): Payer: Self-pay | Admitting: Psychology

## 2022-09-23 ENCOUNTER — Telehealth (INDEPENDENT_AMBULATORY_CARE_PROVIDER_SITE_OTHER): Payer: Managed Care, Other (non HMO) | Admitting: Psychology

## 2022-09-23 NOTE — Progress Notes (Unsigned)
  Office: [phone]  /  Fax: [phone]    Date: Sep 23, 2022    Appointment Start Time: *** Duration: *** minutes Provider: Lawerance Cruel, Psy.D. Type of Session: Individual Therapy  Location of Patient: {gbptloc:23249} (private location) Location of Provider: Provider's Home (private office) Type of Contact: Telepsychological Visit via MyChart Video Visit  Session Content: This provider called [name] at 11:03am as she did not present for today's appointment. A HIPAA compliant voicemail was left requesting a call back.  As such, today's appointment was initiated *** minutes late.Stacey Castaneda is a 52 y.o. female presenting for a follow-up appointment to address the previously established treatment goal of increasing coping skills.Today's appointment was a telepsychological visit. [name] provided verbal consent for today's telepsychological appointment and she is aware she is responsible for securing confidentiality on her end of the session. Prior to proceeding with today's appointment, [name]'s physical location at the time of this appointment was obtained as well a phone number she could be reached at in the event of technical difficulties. [name] and this provider participated in today's telepsychological service.   This provider conducted a brief check-in. *** [name] was receptive to today's appointment as evidenced by openness to sharing, responsiveness to feedback, and {gbreceptiveness:23401}.  Mental Status Examination:  Appearance: {Appearance:22431} Behavior: {Behavior:22445} Mood: {gbmood:21757} Affect: {Affect:22436} Speech: {Speech:22432} Eye Contact: {Eye Contact:22433} Psychomotor Activity: {Motor Activity:22434} Gait: {gbgait:23404} Thought Process: {thought process:22448}  Thought Content/Perception: {disturbances:22451} Orientation: {Orientation:22437} Memory/Concentration: {gbcognition:22449} Insight: {Insight:22446} Judgment:  {Insight:22446}  Interventions:  {Interventions for Progress Notes:23405}  DSM-5 Diagnosis(es):  F50.89 Other Specified Feeding or Eating Disorder, Emotional and Binge Eating Behaviors, F41.9 Unspecified Anxiety Disorder, and  F32.A Unspecified Depressive Disorder  Treatment Goal & Progress: During the initial appointment with this provider, the following treatment goal was established: increase coping skills. [name] has demonstrated progress in her goal as evidenced by {gbtxprogress:22839}. [name] also {gbtxprogress2:22951}.  Plan: The next appointment is scheduled for *** at ***, which will be via MyChart Video Visit. The next session will focus on {Plan for Next Appointment:23400}.

## 2022-09-23 NOTE — Telephone Encounter (Signed)
  Office: [phone]  /  Fax: [phone]  Date of Call: Sep 23, 2022  Time of Call: 11:03am Provider: Lawerance Cruel, PsyD  CONTENT: This provider called Stacey Castaneda to check-in as she did not present for today's MyChart Video Visit appointment at 11:00am. A HIPAA compliant voicemail was left requesting a call back. Of note, this provider stayed on the MyChart Video Visit appointment for 5 minutes prior to signing off per the clinic's grace period policy.    PLAN: This provider will wait for Stacey Castaneda to call back. No further follow-up planned by this provider.

## 2022-09-24 ENCOUNTER — Telehealth (INDEPENDENT_AMBULATORY_CARE_PROVIDER_SITE_OTHER): Payer: Managed Care, Other (non HMO) | Admitting: Psychology

## 2022-09-24 DIAGNOSIS — F32A Depression, unspecified: Secondary | ICD-10-CM | POA: Diagnosis not present

## 2022-09-24 DIAGNOSIS — F419 Anxiety disorder, unspecified: Secondary | ICD-10-CM | POA: Diagnosis not present

## 2022-09-24 DIAGNOSIS — F5089 Other specified eating disorder: Secondary | ICD-10-CM

## 2022-09-24 NOTE — Progress Notes (Signed)
  Office: [phone]  /  Fax: [phone]    Date: Sep 24, 2022    Appointment Start Time: 9:01am Duration: 25 minutes Provider: Lawerance Cruel, Psy.D. Type of Session: Individual Therapy  Location of Patient: Home (private location) Location of Provider: Provider's Home (private office) Type of Contact: Telepsychological Visit via MyChart Video Visit  Session Content: Stacey Castaneda is a 52 y.o. female presenting for a follow-up appointment to address the previously established treatment goal of increasing coping skills.Today's appointment was a telepsychological visit. Stacey Castaneda provided verbal consent for today's telepsychological appointment and she is aware she is responsible for securing confidentiality on her end of the session. Prior to proceeding with today's appointment, Zakyla's physical location at the time of this appointment was obtained as well a phone number she could be reached at in the event of technical difficulties. Callahan and this provider participated in today's telepsychological service.   This provider conducted a brief check-in. Stacey Castaneda shared she continues to work on organizing her home and increasing physical activity. Regarding eating habits, she noted some fluctuations. Further explored and processed. She acknowledged she is not journaling regularly. Psychoeducation regarding SMART goals was provided and Dina was engaged in goal setting. The following goal was established: Stacey Castaneda will journal food intake for the day at least 2 out of 7 days a week between now and the next appointment with this provider. Stacey Castaneda was receptive to today's appointment as evidenced by openness to sharing, responsiveness to feedback, and willingness to work toward the established SMART goal.  Mental Status Examination:  Appearance: neat Behavior: appropriate to circumstances Mood: neutral Affect: mood congruent Speech: WNL Eye Contact: appropriate Psychomotor Activity:  WNL Gait: unable to assess Thought Process: linear, logical, and goal directed and no evidence or endorsement of suicidal, homicidal, and self-harm ideation, plan and intent  Thought Content/Perception: no hallucinations, delusions, bizarre thinking or behavior endorsed or observed Orientation: AAOx4 Memory/Concentration: intact Insight: fair Judgment: fair  Interventions:  Conducted a brief chart review Provided empathic reflections and validation Employed supportive psychotherapy interventions to facilitate reduced distress and to improve coping skills with identified stressors Engaged patient in goal setting Recommended/discussed options for longer-term therapeutic services Psychoeducation provided regarding SMART goals  DSM-5 Diagnosis(es):  F50.89 Other Specified Feeding or Eating Disorder, Emotional and Binge Eating Behaviors, F41.9 Unspecified Anxiety Disorder, and  F32.A Unspecified Depressive Disorder  Treatment Goal & Progress:  During the initial appointment with this provider, the following treatment goal was established: increase coping skills. Stacey Castaneda has demonstrated progress in her goal as evidenced by increased awareness of hunger patterns, increased awareness of triggers for emotional eating behaviors, and reduction in emotional and binge eating behaviors. Stacey Castaneda also continues to demonstrate willingness to engage in learned skill(s).   Plan: The next appointment is scheduled for 10/29/2022 at 8:30am, which will be via MyChart Video Visit. The next session will focus on working towards the established treatment goal. Stacey Castaneda will follow-up with the shared referrals.

## 2022-10-10 ENCOUNTER — Ambulatory Visit: Payer: Managed Care, Other (non HMO)

## 2022-10-10 DIAGNOSIS — E042 Nontoxic multinodular goiter: Secondary | ICD-10-CM

## 2022-10-10 DIAGNOSIS — Z1231 Encounter for screening mammogram for malignant neoplasm of breast: Secondary | ICD-10-CM

## 2022-10-11 NOTE — Progress Notes (Signed)
Please call patient. Normal mammogram.  Repeat in 1 year.  

## 2022-10-11 NOTE — Progress Notes (Signed)
Hi Stacey Castaneda, ultrasound confirms multinodular gland again.  There is a new nodule in the right superior lobe and this particular nodule does meet criteria to be monitored.  So they recommend repeating the ultrasound annually for up to 5 years.

## 2022-10-16 ENCOUNTER — Other Ambulatory Visit: Payer: Self-pay | Admitting: Family Medicine

## 2022-10-16 DIAGNOSIS — F418 Other specified anxiety disorders: Secondary | ICD-10-CM

## 2022-10-24 ENCOUNTER — Ambulatory Visit (INDEPENDENT_AMBULATORY_CARE_PROVIDER_SITE_OTHER): Payer: Managed Care, Other (non HMO) | Admitting: Family Medicine

## 2022-10-24 VITALS — BP 125/82 | HR 84 | Temp 98.3°F | Ht 64.0 in | Wt 292.0 lb

## 2022-10-24 DIAGNOSIS — E88819 Insulin resistance, unspecified: Secondary | ICD-10-CM

## 2022-10-24 DIAGNOSIS — E559 Vitamin D deficiency, unspecified: Secondary | ICD-10-CM

## 2022-10-24 DIAGNOSIS — F418 Other specified anxiety disorders: Secondary | ICD-10-CM | POA: Diagnosis not present

## 2022-10-24 DIAGNOSIS — Z6841 Body Mass Index (BMI) 40.0 and over, adult: Secondary | ICD-10-CM

## 2022-10-24 MED ORDER — VITAMIN D (ERGOCALCIFEROL) 1.25 MG (50000 UNIT) PO CAPS
50000.0000 [IU] | ORAL_CAPSULE | ORAL | 0 refills | Status: DC
Start: 2022-10-24 — End: 2023-02-19

## 2022-10-24 MED ORDER — METFORMIN HCL ER 500 MG PO TB24
500.0000 mg | ORAL_TABLET | Freq: Every day | ORAL | 0 refills | Status: DC
Start: 2022-10-24 — End: 2022-11-21

## 2022-10-24 NOTE — Assessment & Plan Note (Signed)
Making progress and really working on stress reduction, mindful eating and better food choices She is motivated to continue Barriers to progress include physical limitations of her apartment, poor support from her husband and working long hours with job stress.  Consider use of a GLP-1 receptor agonist in the future pending coverage

## 2022-10-24 NOTE — Assessment & Plan Note (Signed)
Doing well on sertraline 100 mg daily + Wellbutrin XL 150 mg daily.  Seeing Dr Dewaine Conger for counseling.  Lacks a great support system at home.  Working actively on mindful eating and has felt better control over food intake lately with improving meal planning and food choices.  Continue current meds for mood, CBT with Dr Dewaine Conger and continue to work on stress reduction, mindful eating and goal setting.

## 2022-10-24 NOTE — Progress Notes (Signed)
Office: [phone]  /  Fax: [phone]  WEIGHT SUMMARY AND BIOMETRICS  Starting Date: 12/03/21  Starting Weight: 312lb   Weight Lost Since Last Visit: 5lb   Vitals Temp: 98.3 F (36.8 C) BP: 125/82 Pulse Rate: 84 SpO2: 98 %   Body Composition  Body Fat %: 55.6 % Fat Mass (lbs): 163.6 lbs Muscle Mass (lbs): 122.6 lbs Visceral Fat Rating : 21   HPI  Chief Complaint: OBESITY  Stacey Castaneda is here to discuss her progress with her obesity treatment plan. She is on the keeping a food journal and adhering to recommended goals of 1800 calories and 120 protein and states she is following her eating plan approximately 20-40 % of the time. She states she is walking more.    Interval History:  Since last office visit she is is down 5 lb in the past month She has a net weight loss of 20 lb in the past 10 mos of medically supervised weight management This is a 6.4% TBW loss without use of AOM She has been working long hours but has a sit stand desk now She has been working on physical activity She is doing better tracking dietary intake.   She is planning meals better and is getting in more whole foods She is getting in fruits and veggies.  Energy level is a little better Her husband likes junk food and has had a leg amputation from diabetes She has pretty good control of her appetite  Pharmacotherapy: metformin XR 500 mg daily  PHYSICAL EXAM:  Blood pressure 125/82, pulse 84, temperature 98.3 F (36.8 C), height 5\' 4"  (1.626 m), weight 292 lb (132.5 kg), SpO2 98 %. Body mass index is 50.12 kg/m.  General: She is overweight, cooperative, alert, well developed, and in no acute distress. PSYCH: Has normal mood, affect and thought process.   Lungs: Normal breathing effort, no conversational dyspnea.   ASSESSMENT AND PLAN  TREATMENT PLAN FOR OBESITY:  Recommended Dietary Goals  Stacey Castaneda is currently in the action stage of change. As such, her goal is to continue  weight management plan. She has agreed to keeping a food journal and adhering to recommended goals of 1800 calories and 120 g of  protein.  Behavioral Intervention  We discussed the following Behavioral Modification Strategies today: increasing lean protein intake, decreasing simple carbohydrates , increasing vegetables, increasing lower glycemic fruits, increasing fiber rich foods, increasing water intake, work on meal planning and preparation, keeping healthy foods at home, continue to practice mindfulness when eating, and planning for success.  Additional resources provided today: NA  Recommended Physical Activity Goals  Stacey Castaneda has been advised to work up to 150 minutes of moderate intensity aerobic activity a week and strengthening exercises 2-3 times per week for cardiovascular health, weight loss maintenance and preservation of muscle mass.   She has agreed to [employer] strengthening exercises with a goal of 2-3 sessions a week   Pharmacotherapy changes for the treatment of obesity: none  ASSOCIATED CONDITIONS ADDRESSED TODAY  Depression with anxiety Assessment & Plan: Doing well on sertraline 100 mg daily + Wellbutrin XL 150 mg daily.  Seeing Dr Dewaine Conger for counseling.  Lacks a great support system at home.  Working actively on mindful eating and has felt better control over food intake lately with improving meal planning and food choices.  Continue current meds for mood, CBT with Dr Dewaine Conger and continue to work on stress reduction, mindful eating and goal setting.   Vitamin D deficiency Assessment &  Plan: Last vitamin D Lab Results  Component Value Date   VD25OH 36.4 08/19/2022   Doing well on RX ergocalciferol weekly Energy level improving  Recheck level in 2 mos   Insulin resistance Assessment & Plan: Fasting insulin elevated at 30.4 08/2022.  Doing well on metformin XR 500 mg daily without adverse SE. Actively working on weight reduction, reducing starches and  sweets She is using a sit stand desk and has slowly added in a bit more physical activity.  Encouraged home exercise 5 min/ day Avoid high sugar foods and drinks Continue metformin XR 500 mg daily   Morbid obesity (HCC) with starting BMI 53 Assessment & Plan: Making progress and really working on stress reduction, mindful eating and better food choices She is motivated to continue Barriers to progress include physical limitations of her apartment, poor support from her husband and working long hours with job stress.  Consider use of a GLP-1 receptor agonist in the future pending coverage   BMI 50.0-59.9, adult [facility])      She was informed of the importance of frequent follow up visits to maximize her success with intensive lifestyle modifications for her multiple health conditions.   ATTESTASTION STATEMENTS:  Reviewed by clinician on day of visit: allergies, medications, problem list, medical history, surgical history, family history, social history, and previous encounter notes pertinent to obesity diagnosis.   I have personally spent 30 minutes total time today in preparation, patient care, nutritional counseling and documentation for this visit, including the following: review of clinical lab tests; review of medical tests/procedures/services.      Glennis Brink, DO DABFM, DABOM [facility] [address] [place], [place] [address] [phone]

## 2022-10-24 NOTE — Assessment & Plan Note (Signed)
Last vitamin D Lab Results  Component Value Date   VD25OH 36.4 08/19/2022   Doing well on RX ergocalciferol weekly Energy level improving  Recheck level in 2 mos

## 2022-10-24 NOTE — Assessment & Plan Note (Signed)
Fasting insulin elevated at 30.4 08/2022.  Doing well on metformin XR 500 mg daily without adverse SE. Actively working on weight reduction, reducing starches and sweets She is using a sit stand desk and has slowly added in a bit more physical activity.  Encouraged home exercise 5 min/ day Avoid high sugar foods and drinks Continue metformin XR 500 mg daily

## 2022-10-29 ENCOUNTER — Telehealth (INDEPENDENT_AMBULATORY_CARE_PROVIDER_SITE_OTHER): Payer: Managed Care, Other (non HMO) | Admitting: Psychology

## 2022-10-29 DIAGNOSIS — F419 Anxiety disorder, unspecified: Secondary | ICD-10-CM | POA: Diagnosis not present

## 2022-10-29 DIAGNOSIS — F5089 Other specified eating disorder: Secondary | ICD-10-CM | POA: Diagnosis not present

## 2022-10-29 DIAGNOSIS — F32A Depression, unspecified: Secondary | ICD-10-CM

## 2022-10-29 NOTE — Progress Notes (Signed)
  Office: [phone]  /  Fax: [phone]    Date: October 29, 2022  Appointment Start Time: 8:32am Duration: 26 minutes Provider: Lawerance Cruel, Psy.D. Type of Session: Individual Therapy  Location of Patient: Home (private location) Location of Provider: Provider's Home (private office) Type of Contact: Telepsychological Visit via MyChart Video Visit  Session Content: Stacey Castaneda is a 52 y.o. female presenting for a follow-up appointment to address the previously established treatment goal of increasing coping skills.Today's appointment was a telepsychological visit. Heavyn provided verbal consent for today's telepsychological appointment and she is aware she is responsible for securing confidentiality on her end of the session. Prior to proceeding with today's appointment, Stacey Castaneda's physical location at the time of this appointment was obtained as well a phone number she could be reached at in the event of technical difficulties. Stacey Castaneda and this provider participated in today's telepsychological service.   This provider conducted a brief check-in. Stacey Castaneda reported experiencing ongoing work stress; however, she continues to make progress on her personal projects. Regarding eating habits, she discussed focusing on increasing protein intake and physical activity when possible (e.g., using a standing desk, stretching during the work day). Reviewed the previously established SMART goal regarding journaling. She discussed meeting the goal. Discussed what helped her meet her goal. The following SMART goal was established for the coming weeks: Stacey Castaneda will journal food intake for the day at least 4 out of 7 days a week between now and the next appointment with this provider. Overall, Stacey Castaneda was receptive to today's appointment as evidenced by openness to sharing, responsiveness to feedback, and willingness to work toward the established SMART goal.  Mental Status Examination:  Appearance:  neat Behavior: appropriate to circumstances Mood: neutral Affect: mood congruent Speech: WNL Eye Contact: appropriate Psychomotor Activity: WNL Gait: unable to assess Thought Process: linear, logical, and goal directed and no evidence or endorsement of suicidal, homicidal, and self-harm ideation, plan and intent  Thought Content/Perception: no hallucinations, delusions, bizarre thinking or behavior endorsed or observed Orientation: AAOx4 Memory/Concentration: memory, attention, language, and fund of knowledge intact  Insight: fair Judgment: fair  Interventions:  Conducted a brief chart review Provided empathic reflections and validation Reviewed content from the previous session Provided positive reinforcement Employed supportive psychotherapy interventions to facilitate reduced distress and to improve coping skills with identified stressors Engaged patient in goal setting  DSM-5 Diagnosis(es):  F50.89 Other Specified Feeding or Eating Disorder, Emotional and Binge Eating Behaviors, F41.9 Unspecified Anxiety Disorder, and  F32.A Unspecified Depressive Disorder  Treatment Goal & Progress: During the initial appointment with this provider, the following treatment goal was established: increase coping skills. Stacey Castaneda has demonstrated progress in her goal as evidenced by increased awareness of hunger patterns, increased awareness of triggers for emotional eating behaviors, reduction in emotional eating behaviors , and reduction in binge eating behaviors. Stacey Castaneda also continues to demonstrate willingness to engage in learned skill(s).  Plan: The next appointment is scheduled for 11/19/2022 at 10am, which will be via MyChart Video Visit. The next session will focus on working towards the established treatment goal. Stacey Castaneda will follow-up with the shared referrals.

## 2022-11-19 ENCOUNTER — Telehealth (INDEPENDENT_AMBULATORY_CARE_PROVIDER_SITE_OTHER): Payer: Managed Care, Other (non HMO) | Admitting: Psychology

## 2022-11-19 DIAGNOSIS — F32A Depression, unspecified: Secondary | ICD-10-CM

## 2022-11-19 DIAGNOSIS — F5089 Other specified eating disorder: Secondary | ICD-10-CM

## 2022-11-19 DIAGNOSIS — F419 Anxiety disorder, unspecified: Secondary | ICD-10-CM | POA: Diagnosis not present

## 2022-11-19 NOTE — Progress Notes (Signed)
  Office: [phone]  /  Fax: [phone]    Date: November 19, 2022  Appointment Start Time: 10:01am Duration: 27 minutes Provider: Lawerance Cruel, Psy.D. Type of Session: Individual Therapy  Location of Patient: Home (private location) Location of Provider: Provider's Home (private office) Type of Contact: Telepsychological Visit via MyChart Video Visit  Session Content: Stacey Castaneda is a 52 y.o. female presenting for a follow-up appointment to address the previously established treatment goal of increasing coping skills.Today's appointment was a telepsychological visit. Stacey Castaneda provided verbal consent for today's telepsychological appointment and she is aware she is responsible for securing confidentiality on her end of the session. Prior to proceeding with today's appointment, Stacey Castaneda's physical location at the time of this appointment was obtained as well a phone number she could be reached at in the event of technical difficulties. Stacey Castaneda and this provider participated in today's telepsychological service.   This provider conducted a brief check-in. Stacey Castaneda noted "feeling a little better" after a vacation, but acknowledged "not making the best choices" as it relates to her eating habits. Nevertheless, she indicated an overall "slight" improvement in her eating habits. Notably, she shared she forgot about her previously established SMART goal for journaling. Reviewed the goal. She shared she was "lucky to get to three." She was receptive to working toward the goal in the coming weeks. Psychoeducation provided regarding mindful eating strategies (sit down, slowly chew, savor, simplify, and smile). Overall, Stacey Castaneda was receptive to today's appointment as evidenced by openness to sharing, responsiveness to feedback, and willingness to implement discussed strategies .  Mental Status Examination:  Appearance: neat Behavior: appropriate to circumstances Mood: neutral Affect: mood  congruent Speech: WNL Eye Contact: appropriate Psychomotor Activity: WNL Gait: unable to assess Thought Process: linear, logical, and goal directed and no evidence or endorsement of suicidal, homicidal, and self-harm ideation, plan and intent  Thought Content/Perception: no hallucinations, delusions, bizarre thinking or behavior endorsed or observed Orientation: AAOx4 Memory/Concentration: memory, attention, language, and fund of knowledge intact  Insight: fair Judgment: fair  Interventions:  Provided empathic reflections and validation Reviewed content from the previous session Provided positive reinforcement Employed supportive psychotherapy interventions to facilitate reduced distress and to improve coping skills with identified stressors Psychoeducation provided regarding mindful eating   DSM-5 Diagnosis(es):  F50.89 Other Specified Feeding or Eating Disorder, Emotional and Binge Eating Behaviors, F41.9 Unspecified Anxiety Disorder, and  F32.A Unspecified Depressive Disorder  Treatment Goal & Progress: During the initial appointment with this provider, the following treatment goal was established: increase coping skills. Stacey Castaneda has demonstrated progress in her goal as evidenced by increased awareness of hunger patterns, increased awareness of triggers for emotional eating behaviors, reduction in emotional eating behaviors , and reduction in binge eating behaviors. Stacey Castaneda also continues to demonstrate willingness to engage in learned skill(s).  Plan: The next appointment is scheduled for 12/17/2022 at 8:30am, which will be via MyChart Video Visit. The next session will focus on working towards the established treatment goal. Stacey Castaneda was encouraged again to follow-up with the shared referrals.

## 2022-11-21 ENCOUNTER — Ambulatory Visit (INDEPENDENT_AMBULATORY_CARE_PROVIDER_SITE_OTHER): Payer: Managed Care, Other (non HMO) | Admitting: Family Medicine

## 2022-11-21 ENCOUNTER — Encounter (INDEPENDENT_AMBULATORY_CARE_PROVIDER_SITE_OTHER): Payer: Self-pay | Admitting: Family Medicine

## 2022-11-21 VITALS — BP 121/82 | HR 114 | Temp 98.2°F | Ht 64.0 in | Wt 291.0 lb

## 2022-11-21 DIAGNOSIS — F5089 Other specified eating disorder: Secondary | ICD-10-CM | POA: Diagnosis not present

## 2022-11-21 DIAGNOSIS — E559 Vitamin D deficiency, unspecified: Secondary | ICD-10-CM

## 2022-11-21 DIAGNOSIS — Z6841 Body Mass Index (BMI) 40.0 and over, adult: Secondary | ICD-10-CM

## 2022-11-21 DIAGNOSIS — E88819 Insulin resistance, unspecified: Secondary | ICD-10-CM | POA: Diagnosis not present

## 2022-11-21 MED ORDER — METFORMIN HCL ER 500 MG PO TB24
1000.0000 mg | ORAL_TABLET | Freq: Every day | ORAL | 0 refills | Status: DC
Start: 2022-11-21 — End: 2023-02-19

## 2022-11-21 NOTE — Progress Notes (Signed)
Office: [phone]  /  Fax: [phone]  WEIGHT SUMMARY AND BIOMETRICS  Starting Date: 12/03/21  Starting Weight: 312lb   Weight Lost Since Last Visit: 1lb   Vitals Temp: 98.2 F (36.8 C) BP: 121/82 Pulse Rate: (!) 114 SpO2: 95 %   Body Composition  Body Fat %: 55 % Fat Mass (lbs): 160.4 lbs Muscle Mass (lbs): 124.8 lbs Total Body Water (lbs): 98.6 lbs Visceral Fat Rating : 20     HPI  Chief Complaint: OBESITY  Stacey Castaneda is here to discuss her progress with her obesity treatment plan. She is on the keeping a food journal and adhering to recommended goals of 1800 calories and 120 protein and states she is following her eating plan approximately 25 % of the time. She states she is moving/standing more in her house during the day.    Interval History:  Since last office visit she is is down 1 lb She is working on getting protein in with meals She started using her standing desk She is down 21 lb in 11 mos  This is a 6.7% TBW loss She has been working overtime  She is doing CBT with Dr Excell Seltzer Sugar cravings are worse Husband starting to be more supportive  Pharmacotherapy: metformin XR 500 mg daily  PHYSICAL EXAM:  Blood pressure 121/82, pulse (!) 114, temperature 98.2 F (36.8 C), height 5\' 4"  (1.626 m), weight 291 lb (132 kg), SpO2 95%. Body mass index is 49.95 kg/m.  General: She is overweight, cooperative, alert, well developed, and in no acute distress. PSYCH: Has normal mood, affect and thought process.   Lungs: Normal breathing effort, no conversational dyspnea.   ASSESSMENT AND PLAN  TREATMENT PLAN FOR OBESITY:  Recommended Dietary Goals  [name] is currently in the action stage of change. As such, her goal is to continue weight management plan. She has agreed to keeping a food journal and adhering to recommended goals of 1800 calories and 110 g of  protein.  Behavioral Intervention  We discussed the following Behavioral Modification  Strategies today: increasing lean protein intake, decreasing simple carbohydrates , increasing vegetables, increasing lower glycemic fruits, avoiding skipping meals, increasing water intake, work on meal planning and preparation, keeping healthy foods at home, avoiding temptations and identifying enticing environmental cues, continue to practice mindfulness when eating, and planning for success.  Additional resources provided today: NA  Recommended Physical Activity Goals  Pamelyn has been advised to work up to 150 minutes of moderate intensity aerobic activity a week and strengthening exercises 2-3 times per week for cardiovascular health, weight loss maintenance and preservation of muscle mass.   She has agreed to Increase physical activity in their day and reduce sedentary time (increase NEAT).  Pharmacotherapy changes for the treatment of obesity: increase metformin XR to 1000 mg daily  ASSOCIATED CONDITIONS ADDRESSED TODAY  Insulin resistance Assessment & Plan: Fasting insulin high at 30.4 04/25/22 on metformin XR 500 mg once daily with food without adverse Ses.  Working on a lower sugar/ lower starch diet with lean protein at meals and snacks.  Starting to see reduction in visceral fat rating.  Exercise still lacking.  Cravings for sugar have been worse lately.   Increase Metformin XR to 1000 mg once daily with food Update labs next visit Increase walking time  Orders: -     metFORMIN HCl ER; Take 2 tablets (1,000 mg total) by mouth daily with breakfast.  Dispense: 180 tablet; Refill: 0  Morbid obesity (HCC) with starting BMI  53  BMI 45.0-49.9, adult (HCC)  Other Specified Feeding or Eating Disorder, Emotional and Binge Eating Behaviors Assessment & Plan: Seeing Dr Dewaine Conger for CBT and working on mindful eating, making time for self and stress management.  Continue CBT and work on getting in all 3 meals, making time to eat and move more   Vitamin D deficiency Assessment &  Plan: Last vitamin D Lab Results  Component Value Date   VD25OH 36.4 08/19/2022   Doing well on RX vitamin D weekly.  Energy level is improving  Recheck lab next visit       She was informed of the importance of frequent follow up visits to maximize her success with intensive lifestyle modifications for her multiple health conditions.   ATTESTASTION STATEMENTS:  Reviewed by clinician on day of visit: allergies, medications, problem list, medical history, surgical history, family history, social history, and previous encounter notes pertinent to obesity diagnosis.   I have personally spent 30 minutes total time today in preparation, patient care, nutritional counseling and documentation for this visit, including the following: review of clinical lab tests; review of medical tests/procedures/services.      Glennis Brink, DO DABFM, DABOM [facility] [address] [place], [place] [address] [phone]

## 2022-11-21 NOTE — Assessment & Plan Note (Signed)
Last vitamin D Lab Results  Component Value Date   VD25OH 36.4 08/19/2022   Doing well on RX vitamin D weekly.  Energy level is improving  Recheck lab next visit

## 2022-11-21 NOTE — Assessment & Plan Note (Signed)
Fasting insulin high at 30.4 04/25/22 on metformin XR 500 mg once daily with food without adverse Ses.  Working on a lower sugar/ lower starch diet with lean protein at meals and snacks.  Starting to see reduction in visceral fat rating.  Exercise still lacking.  Cravings for sugar have been worse lately.   Increase Metformin XR to 1000 mg once daily with food Update labs next visit Increase walking time

## 2022-11-21 NOTE — Assessment & Plan Note (Signed)
Seeing Dr Dewaine Conger for CBT and working on mindful eating, making time for self and stress management.  Continue CBT and work on getting in all 3 meals, making time to eat and move more

## 2022-12-11 ENCOUNTER — Telehealth (INDEPENDENT_AMBULATORY_CARE_PROVIDER_SITE_OTHER): Payer: Self-pay | Admitting: Family Medicine

## 2022-12-11 NOTE — Telephone Encounter (Signed)
On 8/7, Stacey Castaneda, and Stacey voicemail was left requesting Stacey return call to schedule an appointment with Dr. Cathey Endow.Castaneda stated she need sept 12th

## 2022-12-16 ENCOUNTER — Ambulatory Visit (INDEPENDENT_AMBULATORY_CARE_PROVIDER_SITE_OTHER): Payer: Managed Care, Other (non HMO) | Admitting: Family Medicine

## 2022-12-16 ENCOUNTER — Encounter (INDEPENDENT_AMBULATORY_CARE_PROVIDER_SITE_OTHER): Payer: Self-pay | Admitting: Family Medicine

## 2022-12-16 VITALS — BP 119/79 | HR 79 | Temp 98.5°F | Ht 64.0 in | Wt 299.0 lb

## 2022-12-16 DIAGNOSIS — I10 Essential (primary) hypertension: Secondary | ICD-10-CM

## 2022-12-16 DIAGNOSIS — R5383 Other fatigue: Secondary | ICD-10-CM

## 2022-12-16 DIAGNOSIS — Z6841 Body Mass Index (BMI) 40.0 and over, adult: Secondary | ICD-10-CM

## 2022-12-16 DIAGNOSIS — E559 Vitamin D deficiency, unspecified: Secondary | ICD-10-CM

## 2022-12-16 DIAGNOSIS — F5089 Other specified eating disorder: Secondary | ICD-10-CM

## 2022-12-16 DIAGNOSIS — E88819 Insulin resistance, unspecified: Secondary | ICD-10-CM

## 2022-12-16 NOTE — Assessment & Plan Note (Signed)
She is seeing Dr Dewaine Conger for CBT.  She has had better control while on metformin but lacks a good support system at home and job stress continues to be a factor.  We talked about grocery shopping on days off work, creating a list and having easy grab and go meals on hand for the week when work gets busy.  Will focus on meals with limited snacks vs over snacking.

## 2022-12-16 NOTE — Progress Notes (Signed)
Office: [phone]  /  Fax: [phone]  WEIGHT SUMMARY AND BIOMETRICS  Starting Date: 12/03/21  Starting Weight: 312lb   Weight Lost Since Last Visit: 0lb   Vitals Temp: 98.5 F (36.9 C) BP: 119/79 Pulse Rate: 79 SpO2: 99 %   Body Composition  Body Fat %: 55 % Fat Mass (lbs): 164.4 lbs Muscle Mass (lbs): 127.8 lbs Total Body Water (lbs): 95.4 lbs Visceral Fat Rating : 21     HPI  Chief Complaint: OBESITY  Stacey Castaneda is here to discuss her progress with her obesity treatment plan. She is on the keeping a food journal and adhering to recommended goals of 1800 calories and 120 protein and states she is following her eating plan approximately 10-15 % of the time. She states she is exercising 0 minutes 0 times per week.   Interval History:  Since last office visit she is up 8 lb 3 lb is muscle gain and 4 lb is body fat gain This gives her a net weight loss of 13 lb in the past year She did go up on metformin to 1000 mg once a day with food but had to cut it to 750 mg due to diarrhea She has continued to struggle to eat on her plan or to track calories with a poor support system at home and working long hours.  Planning and prepping meals has been hard She has been having more pain in her knees since helping out her sister who has stairs She has been trying out her standing desk  Pharmacotherapy: metformin 750 mg daily with food  PHYSICAL EXAM:  Blood pressure 119/79, pulse 79, temperature 98.5 F (36.9 C), height 5\' 4"  (1.626 m), weight 299 lb (135.6 kg), SpO2 99%. Body mass index is 51.32 kg/m.  General: She is overweight, cooperative, alert, well developed, and in no acute distress. PSYCH: Has normal mood, affect and thought process.   Lungs: Normal breathing effort, no conversational dyspnea.   ASSESSMENT AND PLAN  TREATMENT PLAN FOR OBESITY:  Recommended Dietary Goals  Stacey Castaneda is currently in the action stage of change. As such, her goal is to  continue weight management plan. She has agreed to keeping a food journal and adhering to recommended goals of 1800 calories and 110 + g of  protein.  Behavioral Intervention  We discussed the following Behavioral Modification Strategies today: increasing lean protein intake, decreasing simple carbohydrates , increasing vegetables, increasing lower glycemic fruits, increasing water intake, work on meal planning and preparation, keeping healthy foods at home, continue to work on implementation of reduced calorie nutritional plan, continue to practice mindfulness when eating, and planning for success.  Additional resources provided today: NA  Recommended Physical Activity Goals  Stacey Castaneda has been advised to work up to 150 minutes of moderate intensity aerobic activity a week and strengthening exercises 2-3 times per week for cardiovascular health, weight loss maintenance and preservation of muscle mass.   She has agreed to Think about ways to increase daily physical activity and overcoming barriers to exercise  Pharmacotherapy changes for the treatment of obesity: none  ASSOCIATED CONDITIONS ADDRESSED TODAY  Insulin resistance Assessment & Plan: Last fasting insulin high at 30.4, doing well on metformin.  Dose was increased last visit but had diarrhea moving up to 1,000 mg once a day with food.  She is doing well on metformin 750 mg once daily without problems.  This has helped to control hunger and cravings.  She is working on reducing intake of sugar  and starches.  Exercise is still lacking.   Continue metformin 750 mg daily with food Continue prescribed dietary plan. Work on increasing NEAT. Recheck labs today  Orders: -     Hemoglobin A1c -     Insulin, random  Vitamin D deficiency Assessment & Plan: Last vitamin D Lab Results  Component Value Date   VD25OH 36.4 08/19/2022   She has been on RX vitamin D weekly.  Energy level is improving  Recheck level today  Orders: -      VITAMIN D 25 Hydroxy (Vit-D Deficiency, Fractures)  Essential hypertension Assessment & Plan: BP is well controlled on Olmesartan - amlodopine- hydrochlorothiazide 40-10-25 mg once daily and Spironolactone 25 mg daily.  Denies CP or HA.  Continue current BP meds and update labs today Continue active plan for weight reduction  Orders: -     Comprehensive metabolic panel -     Lipid panel  Other fatigue -     TSH Rfx on Abnormal to Free T4 -     CBC -     Vitamin B12  Morbid obesity (HCC) with starting BMI 53  BMI 50.0-59.9, adult (HCC)  Other Specified Feeding or Eating Disorder, Emotional and Binge Eating Behaviors Assessment & Plan: She is seeing Dr Dewaine Conger for CBT.  She has had better control while on metformin but lacks a good support system at home and job stress continues to be a factor.  We talked about grocery shopping on days off work, creating a list and having easy grab and go meals on hand for the week when work gets busy.  Will focus on meals with limited snacks vs over snacking.       She was informed of the importance of frequent follow up visits to maximize her success with intensive lifestyle modifications for her multiple health conditions.   ATTESTASTION STATEMENTS:  Reviewed by clinician on day of visit: allergies, medications, problem list, medical history, surgical history, family history, social history, and previous encounter notes pertinent to obesity diagnosis.   I have personally spent 30 minutes total time today in preparation, patient care, nutritional counseling and documentation for this visit, including the following: review of clinical lab tests; review of medical tests/procedures/services.      Glennis Brink, DO DABFM, DABOM [facility] [address] [place], [place] [address] [phone]

## 2022-12-16 NOTE — Assessment & Plan Note (Signed)
Last vitamin D Lab Results  Component Value Date   VD25OH 36.4 08/19/2022   She has been on RX vitamin D weekly.  Energy level is improving  Recheck level today

## 2022-12-16 NOTE — Assessment & Plan Note (Signed)
Last fasting insulin high at 30.4, doing well on metformin.  Dose was increased last visit but had diarrhea moving up to 1,000 mg once a day with food.  She is doing well on metformin 750 mg once daily without problems.  This has helped to control hunger and cravings.  She is working on reducing intake of sugar and starches.  Exercise is still lacking.   Continue metformin 750 mg daily with food Continue prescribed dietary plan. Work on increasing NEAT. Recheck labs today

## 2022-12-16 NOTE — Assessment & Plan Note (Signed)
BP is well controlled on Olmesartan - amlodopine- hydrochlorothiazide 40-10-25 mg once daily and Spironolactone 25 mg daily.  Denies CP or HA.  Continue current BP meds and update labs today Continue active plan for weight reduction

## 2022-12-17 ENCOUNTER — Telehealth (INDEPENDENT_AMBULATORY_CARE_PROVIDER_SITE_OTHER): Payer: Managed Care, Other (non HMO) | Admitting: Psychology

## 2022-12-17 DIAGNOSIS — F5089 Other specified eating disorder: Secondary | ICD-10-CM

## 2022-12-17 DIAGNOSIS — F32A Depression, unspecified: Secondary | ICD-10-CM | POA: Diagnosis not present

## 2022-12-17 DIAGNOSIS — F419 Anxiety disorder, unspecified: Secondary | ICD-10-CM | POA: Diagnosis not present

## 2022-12-17 DIAGNOSIS — F909 Attention-deficit hyperactivity disorder, unspecified type: Secondary | ICD-10-CM | POA: Diagnosis not present

## 2022-12-17 NOTE — Progress Notes (Signed)
  Office: [phone]  /  Fax: [phone]    Date: December 17, 2022  Appointment Start Time: 8:30am Duration: 34 minutes Provider: Lawerance Cruel, Psy.D. Type of Session: Individual Therapy  Location of Patient: Home (private location) Location of Provider: Provider's Home (private office) Type of Contact: Telepsychological Visit via MyChart Video Visit  Session Content: Stacey Castaneda is a 52 y.o. female presenting for a follow-up appointment to address the previously established treatment goal of increasing coping skills.Today's appointment was a telepsychological visit. Stacey Castaneda provided verbal consent for today's telepsychological appointment and she is aware she is responsible for securing confidentiality on her end of the session. Prior to proceeding with today's appointment, Stacey Castaneda's physical location at the time of this appointment was obtained as well a phone number she could be reached at in the event of technical difficulties. Stacey Castaneda and this provider participated in today's telepsychological service.   This provider conducted a brief check-in. Stacey Castaneda shared she is "feeling under the weather." She acknowledged she did not meet any previously established goals. Explored barriers. She identified that when her office space is "messy," she cannot focus on any other tasks (e.g., planning meals congruent to her goals with the clinic). As such, today's appointment focused on assisting Stacey Castaneda develop a plan to finalize her office to allow "head space" to focus on other aspects of her life. Of note, Stacey Castaneda described experiencing the following: forgetfulness, becoming easily distracted, procrastinating, organization challenges, and restlessness. She indicated the aforementioned symptoms have been present during schooling and appear to occur independent of mood; however, have been exacerbated with recent stress. Overall, Stacey Castaneda was receptive to today's appointment as evidenced by openness to  sharing, responsiveness to feedback, and willingness to implement discussed strategies .  Mental Status Examination:  Appearance: neat Behavior: appropriate to circumstances Mood: neutral Affect: mood congruent Speech: WNL Eye Contact: appropriate Psychomotor Activity: WNL Gait: unable to assess Thought Process: linear, logical, and goal directed and no evidence or endorsement of suicidal, homicidal, and self-harm ideation, plan and intent  Thought Content/Perception: no hallucinations, delusions, bizarre thinking or behavior endorsed or observed Orientation: AAOx4 Memory/Concentration: intact Insight: fair Judgment: fair  Interventions:  Conducted a brief chart review Provided empathic reflections and validation Employed supportive psychotherapy interventions to facilitate reduced distress and to improve coping skills with identified stressors Engaged patient in problem solving Recommended/discussed options for longer-term therapeutic services  DSM-5 Diagnosis(es): F50.89 Other Specified Feeding or Eating Disorder, Emotional and Binge Eating Behaviors, F41.9 Unspecified Anxiety Disorder, F32.A Unspecified Depressive Disorder, and  F90.9 Unspecified Attention-Deficit/Hyperactivity Disorder   Treatment Goal & Progress: During the initial appointment with this provider, the following treatment goal was established: increase coping skills. Stacey Castaneda has demonstrated progress in her goal as evidenced by increased awareness of hunger patterns, increased awareness of triggers for emotional eating behaviors, and reduction in emotional eating behaviors . Stacey Castaneda also continues to demonstrate willingness to engage in learned skill(s).  Plan: The next appointment is scheduled for 01/20/2023 at 8:30am, which will be via MyChart Video Visit. The next session will focus on working towards the established treatment goal. Additionally, she provided verbal consent for this provider to place a referral  with [facility] for an ADHD evaluation and therapeutic services.

## 2023-01-20 ENCOUNTER — Telehealth (INDEPENDENT_AMBULATORY_CARE_PROVIDER_SITE_OTHER): Payer: Managed Care, Other (non HMO) | Admitting: Psychology

## 2023-01-20 DIAGNOSIS — F419 Anxiety disorder, unspecified: Secondary | ICD-10-CM | POA: Diagnosis not present

## 2023-01-20 DIAGNOSIS — F32A Depression, unspecified: Secondary | ICD-10-CM

## 2023-01-20 DIAGNOSIS — F909 Attention-deficit hyperactivity disorder, unspecified type: Secondary | ICD-10-CM | POA: Diagnosis not present

## 2023-01-20 DIAGNOSIS — F5089 Other specified eating disorder: Secondary | ICD-10-CM | POA: Diagnosis not present

## 2023-01-20 NOTE — Progress Notes (Signed)
  Office: [phone]  /  Fax: [phone]    Date: January 20, 2023  Appointment Start Time: 8:30am Duration: 18 minutes Provider: Lawerance Cruel, Psy.D. Type of Session: Individual Therapy  Location of Patient: Home (private location) Location of Provider: Provider's Home (private office) Type of Contact: Telepsychological Visit via MyChart Video Visit  Session Content: Stacey Castaneda is a 52 y.o. female presenting for a follow-up appointment to address the previously established treatment goal of increasing coping skills.Today's appointment was a telepsychological visit. Stacey Castaneda provided verbal consent for today's telepsychological appointment and she is aware she is responsible for securing confidentiality on her end of the session. Prior to proceeding with today's appointment, Stacey Castaneda's physical location at the time of this appointment was obtained as well a phone number she could be reached at in the event of technical difficulties. Stacey Castaneda and this provider participated in today's telepsychological service.   This provider conducted a brief check-in. Stacey Castaneda shared, "Not the best month." She shared her brother passed away and she has increased work-related stressors. Associated thoughts and feelings were processed. Due to the aforementioned, she indicated she did not work toward the previously established goals. As such, this provider's role was reviewed and establishing care with [facility] was discussed. Stacey Castaneda expressed desire to discontinue appointments with this provider at this time, and noted a plan to re-initiate if needed in the future. She also noted a plan to complete new patient paperwork with [facility] to establish care. Remainder of session focused on briefly reviewing learned skills (e.g., engaging in self-care, having protein snacks available, using visual reminders). Overall, Stacey Castaneda was receptive to today's appointment as evidenced by  openness to sharing, responsiveness to feedback, and willingness to continue engaging in learned skills.  Mental Status Examination:  Appearance: neat Behavior: appropriate to circumstances Mood: sad Affect: mood congruent Speech: WNL Eye Contact: appropriate Psychomotor Activity: WNL Gait: unable to assess Thought Process: linear, logical, and goal directed and no evidence or endorsement of suicidal, homicidal, and self-harm ideation, plan and intent  Thought Content/Perception: no hallucinations, delusions, bizarre thinking or behavior endorsed or observed Orientation: AAOx4 Memory/Concentration: intact Insight: fair Judgment: fair  Interventions:  Conducted a brief chart review Provided empathic reflections and validation Provided positive reinforcement Employed supportive psychotherapy interventions to facilitate reduced distress and to improve coping skills with identified stressors Reviewed learned skills Recommended/discussed options for longer-term therapeutic services  DSM-5 Diagnosis(es):  F50.89 Other Specified Feeding or Eating Disorder, Emotional and Binge Eating Behaviors, F41.9 Unspecified Anxiety Disorder, F32.A Unspecified Depressive Disorder, and  F90.9 Unspecified Attention-Deficit/Hyperactivity Disorder   Treatment Goal & Progress: During the initial appointment with this provider, the following treatment goal was established: increase coping skills. Stacey Castaneda has demonstrated progress in her goal as evidenced by increased awareness of hunger patterns, increased awareness of triggers for emotional eating behaviors, and reduction in emotional eating behaviors . Stacey Castaneda also continues to demonstrate willingness to engage in learned skill(s).  Plan: Stacey Castaneda declined future appointments with this provider at this time [see above]. She acknowledged understanding that she may request a follow-up appointment with this provider in the future as long as she is still  established with the clinic. No further follow-up planned by this provider.

## 2023-01-23 ENCOUNTER — Encounter: Payer: Self-pay | Admitting: Family Medicine

## 2023-01-23 ENCOUNTER — Ambulatory Visit: Payer: Managed Care, Other (non HMO) | Admitting: Family Medicine

## 2023-01-23 VITALS — BP 124/77 | HR 101 | Temp 98.4°F | Ht 64.0 in | Wt 292.0 lb

## 2023-01-23 DIAGNOSIS — E88819 Insulin resistance, unspecified: Secondary | ICD-10-CM | POA: Diagnosis not present

## 2023-01-23 DIAGNOSIS — Z6841 Body Mass Index (BMI) 40.0 and over, adult: Secondary | ICD-10-CM

## 2023-01-23 DIAGNOSIS — E559 Vitamin D deficiency, unspecified: Secondary | ICD-10-CM

## 2023-01-23 NOTE — Progress Notes (Signed)
Office: [phone]  /  Fax: [phone]  WEIGHT SUMMARY AND BIOMETRICS  No data recorded  No data recorded   No data recorded   No data recorded  No data recorded  HPI  Chief Complaint: OBESITY  Stacey Castaneda is here to discuss her progress with her obesity treatment plan. She is on the keeping a food journal and adhering to recommended goals of 1800 calories and 110 protein and states she is following her eating plan approximately 10-15 % of the time. She states she is exercising 30 minutes 2 times per week.  Interval History:  Since last office visit she is down 7 lb She has been thru the death of her brother between visits She has had to work a lot She has a net weight loss of 20 lb in the past 13 mos She is trying to keep healthy food at home Her husband continues to bring in junk food but she has been mindful She is still lacking adequate protein intake with meals She is trying to track more often She hasn't been using her standing desk or moving around as much She is thinking about joining the gym  Pharmacotherapy: metformin XR 750 mg daily daily for IR  PHYSICAL EXAM:  Blood pressure 124/77, pulse (!) 101, temperature 98.4 F (36.9 C), height 5\' 4"  (1.626 m), weight 292 lb (132.5 kg), SpO2 96%. Body mass index is 50.12 kg/m.  General: She is overweight, cooperative, alert, well developed, and in no acute distress. PSYCH: Has normal mood, affect and thought process.   Lungs: Normal breathing effort, no conversational dyspnea.   ASSESSMENT AND PLAN  TREATMENT PLAN FOR OBESITY:  Recommended Dietary Goals  Stacey Castaneda is currently in the action stage of change. As such, her goal is to continue weight management plan. She has agreed to keeping a food journal and adhering to recommended goals of 1800 calories and 110 g of  protein.  Behavioral Intervention  We discussed the following Behavioral Modification Strategies today: increasing lean protein intake,  decreasing simple carbohydrates , increasing vegetables, increasing lower glycemic fruits, increasing water intake, work on meal planning and preparation, keeping healthy foods at home, continue to practice mindfulness when eating, and planning for success.  Additional resources provided today: NA  Recommended Physical Activity Goals  Stacey Castaneda has been advised to work up to 150 minutes of moderate intensity aerobic activity a week and strengthening exercises 2-3 times per week for cardiovascular health, weight loss maintenance and preservation of muscle mass.   She has agreed to Think about ways to increase daily physical activity and overcoming barriers to exercise - check out [place] [employer]  Pharmacotherapy changes for the treatment of obesity: none  ASSOCIATED CONDITIONS ADDRESSED TODAY  Insulin resistance Assessment & Plan: Doing well on metformin, reduced back to metformin XR 750 mg once daily which was better tolerated. She is working on reducing intake of sugar and starches and plans to get in more regular physical activity  Continue to work on healthy lifestyle changes Repeat fasting insulin / B12/ chemistry panel in the next 3 mos   Vitamin D deficiency Assessment & Plan: Last vitamin D Lab Results  Component Value Date   VD25OH 41.7 12/16/2022   She is taking RX vitamin D weekly  Energy level is improving  Repeat vitamin D level in 3 mos   Morbid obesity (HCC) with starting BMI 53  BMI 50.0-59.9, adult (HCC)      She was informed of the importance of  frequent follow up visits to maximize her success with intensive lifestyle modifications for her multiple health conditions.   ATTESTASTION STATEMENTS:  Reviewed by clinician on day of visit: allergies, medications, problem list, medical history, surgical history, family history, social history, and previous encounter notes pertinent to obesity diagnosis.   I have personally spent 30 minutes total  time today in preparation, patient care, nutritional counseling and documentation for this visit, including the following: review of clinical lab tests; review of medical tests/procedures/services.      Glennis Brink, DO DABFM, DABOM [facility] [address] [place], [place] [address] [phone]

## 2023-01-27 NOTE — Assessment & Plan Note (Signed)
Doing well on metformin, reduced back to metformin XR 750 mg once daily which was better tolerated. She is working on reducing intake of sugar and starches and plans to get in more regular physical activity  Continue to work on healthy lifestyle changes Repeat fasting insulin / B12/ chemistry panel in the next 3 mos

## 2023-01-27 NOTE — Assessment & Plan Note (Signed)
Last vitamin D Lab Results  Component Value Date   VD25OH 41.7 12/16/2022   She is taking RX vitamin D weekly  Energy level is improving  Repeat vitamin D level in 3 mos

## 2023-02-03 ENCOUNTER — Other Ambulatory Visit (INDEPENDENT_AMBULATORY_CARE_PROVIDER_SITE_OTHER): Payer: Self-pay | Admitting: Family Medicine

## 2023-02-03 DIAGNOSIS — E559 Vitamin D deficiency, unspecified: Secondary | ICD-10-CM

## 2023-02-18 ENCOUNTER — Other Ambulatory Visit: Payer: Self-pay | Admitting: Family Medicine

## 2023-02-18 DIAGNOSIS — I10 Essential (primary) hypertension: Secondary | ICD-10-CM

## 2023-02-19 ENCOUNTER — Ambulatory Visit: Payer: Managed Care, Other (non HMO) | Admitting: Family Medicine

## 2023-02-19 ENCOUNTER — Encounter: Payer: Self-pay | Admitting: Family Medicine

## 2023-02-19 VITALS — BP 126/69 | HR 87 | Temp 98.4°F | Ht 64.0 in | Wt 293.0 lb

## 2023-02-19 DIAGNOSIS — E559 Vitamin D deficiency, unspecified: Secondary | ICD-10-CM

## 2023-02-19 DIAGNOSIS — E662 Morbid (severe) obesity with alveolar hypoventilation: Secondary | ICD-10-CM

## 2023-02-19 DIAGNOSIS — E88819 Insulin resistance, unspecified: Secondary | ICD-10-CM

## 2023-02-19 DIAGNOSIS — Z6841 Body Mass Index (BMI) 40.0 and over, adult: Secondary | ICD-10-CM

## 2023-02-19 DIAGNOSIS — E66813 Obesity, class 3: Secondary | ICD-10-CM

## 2023-02-19 DIAGNOSIS — G4733 Obstructive sleep apnea (adult) (pediatric): Secondary | ICD-10-CM

## 2023-02-19 MED ORDER — METFORMIN HCL ER 500 MG PO TB24
1000.0000 mg | ORAL_TABLET | Freq: Every day | ORAL | 0 refills | Status: DC
Start: 2023-02-19 — End: 2023-07-24

## 2023-02-19 MED ORDER — VITAMIN D (ERGOCALCIFEROL) 1.25 MG (50000 UNIT) PO CAPS
50000.0000 [IU] | ORAL_CAPSULE | ORAL | 0 refills | Status: DC
Start: 2023-02-19 — End: 2023-07-03

## 2023-02-19 NOTE — Assessment & Plan Note (Signed)
Last vitamin D Lab Results  Component Value Date   VD25OH 41.7 12/16/2022   She is doing well on vitamin D 50,000 IU once weekly.  Her energy level is unchanged.  Repeat vitamin D level next visit

## 2023-02-19 NOTE — Assessment & Plan Note (Signed)
Doing well on metformin XR 750 mg once daily with food She has previously had GI upset from increasing metformin XR to 1000 mg once daily She has been actively working on reducing her intake of added sugar and starches She remains quite sedentary with no regular exercise  Plan to recheck fasting insulin next visit Continue metformin XR 750 mg once daily Avoid high sugar foods and drinks and limit starch intake

## 2023-02-19 NOTE — Assessment & Plan Note (Signed)
She has been wearing her CPAP nightly.  She reports good compliance.  Energy level is stable.  Continue active plan for weight reduction.  She has lost a total of 19 pounds in the past 1 year medically supervised weight management.

## 2023-02-19 NOTE — Progress Notes (Signed)
Office: [phone]  /  Fax: [phone]  WEIGHT SUMMARY AND BIOMETRICS  Starting Date: 12/03/21  Starting Weight: 312lb   Weight Lost Since Last Visit: 0lb   Vitals Temp: 98.4 F (36.9 C) BP: 126/69 Pulse Rate: 87 SpO2: 94 %   Body Composition  Body Fat %: 55.5 % Fat Mass (lbs): 162.8 lbs Muscle Mass (lbs): 123.8 lbs Total Body Water (lbs): 101 lbs Visceral Fat Rating : 21    HPI  Chief Complaint: OBESITY  Stacey Castaneda is here to discuss her progress with her obesity treatment plan. She is on the keeping a food journal and adhering to recommended goals of 1800 calories and 120 protein and states she is following her eating plan approximately 15% of the time. She states she is exercising 30 minutes 1 times per week.  Interval History:  Since last office visit she is up 1 lb She has been working on her husband about bringing in junk food She has been working long hours She has not make it to the gym yet She has a standing desk now She has been skipping meals more due to work schedule She did buy [employer] meals to help her with meal prep By not eating enough meals, she has been eating a bigger dinner She is getting protein powder in her coffee She is down a net 19 lb in the past 14 mos  Pharmacotherapy: metformin XR 750 mg daily  PHYSICAL EXAM:  Blood pressure 126/69, pulse 87, temperature 98.4 F (36.9 C), height 5\' 4"  (1.626 m), weight 293 lb (132.9 kg), SpO2 94%. Body mass index is 50.29 kg/m.  General: She is overweight, cooperative, alert, well developed, and in no acute distress. PSYCH: Has normal mood, affect and thought process.   Lungs: Normal breathing effort, no conversational dyspnea.   ASSESSMENT AND PLAN  TREATMENT PLAN FOR OBESITY:  Recommended Dietary Goals  Stacey Castaneda is currently in the action stage of change. As such, her goal is to continue weight management plan. She has agreed to keeping a food journal and adhering to recommended  goals of 1800 calories and 120 g of  protein.  Behavioral Intervention  We discussed the following Behavioral Modification Strategies today: increasing lean protein intake to established goals, increasing vegetables, avoiding skipping meals, work on meal planning and preparation, work on tracking and journaling calories using tracking application, keeping healthy foods at home, work on managing stress, creating time for self-care and relaxation, planning for success, and continue to work on maintaining a reduced calorie state, getting the recommended amount of protein, incorporating whole foods, making healthy choices, staying well hydrated and practicing mindfulness when eating..  Additional resources provided today: NA  Recommended Physical Activity Goals  Stacey Castaneda has been advised to work up to 150 minutes of moderate intensity aerobic activity a week and strengthening exercises 2-3 times per week for cardiovascular health, weight loss maintenance and preservation of muscle mass.   She has agreed to Think about enjoyable ways to increase daily physical activity and overcoming barriers to exercise and Increase physical activity in their day and reduce sedentary time (increase NEAT). - look at work schedule and pick a gym that works for location and amenities  Pharmacotherapy changes for the treatment of obesity: none  ASSOCIATED CONDITIONS ADDRESSED TODAY  Insulin resistance Assessment & Plan: Doing well on metformin XR 750 mg once daily with food She has previously had GI upset from increasing metformin XR to 1000 mg once daily She has been actively working on  reducing her intake of added sugar and starches She remains quite sedentary with no regular exercise  Plan to recheck fasting insulin next visit Continue metformin XR 750 mg once daily Avoid high sugar foods and drinks and limit starch intake  Orders: -     metFORMIN HCl ER; Take 2 tablets (1,000 mg total) by mouth daily with  breakfast.  Dispense: 180 tablet; Refill: 0  Vitamin D deficiency Assessment & Plan: Last vitamin D Lab Results  Component Value Date   VD25OH 41.7 12/16/2022   She is doing well on vitamin D 50,000 IU once weekly.  Her energy level is unchanged.  Repeat vitamin D level next visit  Orders: -     Vitamin D (Ergocalciferol); Take 1 capsule (50,000 Units total) by mouth every 7 (seven) days.  Dispense: 12 capsule; Refill: 0  Class 3 obesity with alveolar hypoventilation, serious comorbidity, and body mass index (BMI) of 50.0 to 59.9 in adult (HCC)  OSA on CPAP Assessment & Plan: She has been wearing her CPAP nightly.  She reports good compliance.  Energy level is stable.  Continue active plan for weight reduction.  She has lost a total of 19 pounds in the past 1 year medically supervised weight management.       She was informed of the importance of frequent follow up visits to maximize her success with intensive lifestyle modifications for her multiple health conditions.   ATTESTASTION STATEMENTS:  Reviewed by clinician on day of visit: allergies, medications, problem list, medical history, surgical history, family history, social history, and previous encounter notes pertinent to obesity diagnosis.   I have personally spent 30 minutes total time today in preparation, patient care, nutritional counseling and documentation for this visit, including the following: review of clinical lab tests; review of medical tests/procedures/services.      Glennis Brink, DO DABFM, DABOM [facility] [address] [place], [place] [address] [phone]

## 2023-03-17 ENCOUNTER — Encounter: Payer: Self-pay | Admitting: Family Medicine

## 2023-03-28 ENCOUNTER — Ambulatory Visit: Payer: Managed Care, Other (non HMO) | Admitting: Family Medicine

## 2023-03-28 ENCOUNTER — Encounter: Payer: Self-pay | Admitting: Family Medicine

## 2023-03-28 VITALS — BP 128/76 | HR 105

## 2023-03-28 DIAGNOSIS — F419 Anxiety disorder, unspecified: Secondary | ICD-10-CM

## 2023-03-28 DIAGNOSIS — Z23 Encounter for immunization: Secondary | ICD-10-CM

## 2023-03-28 DIAGNOSIS — I10 Essential (primary) hypertension: Secondary | ICD-10-CM | POA: Diagnosis not present

## 2023-03-28 DIAGNOSIS — J4540 Moderate persistent asthma, uncomplicated: Secondary | ICD-10-CM | POA: Diagnosis not present

## 2023-03-28 DIAGNOSIS — F32A Depression, unspecified: Secondary | ICD-10-CM | POA: Diagnosis not present

## 2023-03-28 MED ORDER — HYDROXYZINE PAMOATE 25 MG PO CAPS: 25.0000 mg | ORAL_CAPSULE | Freq: Every evening | ORAL | 0 refills | Status: DC | PRN

## 2023-03-28 MED ORDER — ALPRAZOLAM 0.25 MG PO TABS: 0.2500 mg | ORAL_TABLET | Freq: Every day | ORAL | 0 refills | Status: DC | PRN

## 2023-03-28 NOTE — Progress Notes (Signed)
   Established Patient Office Visit  Subjective   Patient ID: Stacey Castaneda, female    DOB: [birth year]  Age: 52 y.o. MRN: [id number]  Chief Complaint  Patient presents with   Hypertension    HPI F/U Anxiety/Depression -having some increased stressors recently.  She would also like to go ahead and get her second shingles as well as flu and COVID vaccines updated today.  Hypertension- Pt denies chest pain, SOB, dizziness, or heart palpitations.  Taking meds as directed w/o problems.  Denies medication side effects.  She has noticed a couple of times she is felt a little lightheaded so is keeping a close eye on her blood pressure.  She is also doing great and working with healthy weight and wellness.  Last office visit was in October.    ROS    Objective:     BP 128/76   Pulse (!) 105   SpO2 97%    Physical Exam Vitals and nursing note reviewed.  Constitutional:      Appearance: Normal appearance.  HENT:     Head: Normocephalic and atraumatic.  Eyes:     Conjunctiva/sclera: Conjunctivae normal.  Cardiovascular:     Rate and Rhythm: Normal rate and regular rhythm.  Pulmonary:     Effort: Pulmonary effort is normal.     Breath sounds: Normal breath sounds.  Skin:    General: Skin is warm and dry.  Neurological:     Mental Status: She is alert.  Psychiatric:        Mood and Affect: Mood normal.      No results found for any visits on 03/28/23.    The 10-year ASCVD risk score (Arnett DK, et al., 2019) is: 1.9%    Assessment & Plan:   Problem List Items Addressed This Visit       Cardiovascular and Mediastinum   Essential hypertension    Well controlled. Continue current regimen. Follow up in  6 mo         Respiratory   Reactive airway disease    Newest pneumonia vaccine is up-to-date.  No refills needed on inhalers currently.  Good air movement on exam today she still gets a little bit of chronic congestion in her chest in the mornings and at  night.        Other   Anxiety and depression - Primary    Has had some increase stressors with recent collection.  She is just finding herself feeling a little bit more anxious and wound up and irritable.  She is also having a little bit more difficult time going to sleep.      Relevant Medications   hydrOXYzine (VISTARIL) 25 MG capsule   ALPRAZolam (XANAX) 0.25 MG tablet   Other Visit Diagnoses     Encounter for immunization       Relevant Orders   Varicella-zoster vaccine IM (Completed)   Flu vaccine trivalent PF, 6mos and older(Flulaval,Afluria,Fluarix,Fluzone) (Completed)   Pfizer Comirnaty Covid-19 Vaccine 110yrs & older (Completed)       Return in about 6 months (around 09/25/2023) for Hypertension, Pre-diabetes.    Nani Gasser, MD

## 2023-03-28 NOTE — Assessment & Plan Note (Signed)
Has had some increase stressors with recent collection.  She is just finding herself feeling a little bit more anxious and wound up and irritable.  She is also having a little bit more difficult time going to sleep.

## 2023-03-28 NOTE — Assessment & Plan Note (Signed)
Well controlled. Continue current regimen. Follow up in  6 mo  

## 2023-03-28 NOTE — Assessment & Plan Note (Signed)
Newest pneumonia vaccine is up-to-date.  No refills needed on inhalers currently.  Good air movement on exam today she still gets a little bit of chronic congestion in her chest in the mornings and at night.

## 2023-04-07 ENCOUNTER — Ambulatory Visit: Payer: Managed Care, Other (non HMO) | Admitting: Family Medicine

## 2023-04-14 ENCOUNTER — Other Ambulatory Visit: Payer: Self-pay | Admitting: Family Medicine

## 2023-04-14 DIAGNOSIS — F418 Other specified anxiety disorders: Secondary | ICD-10-CM

## 2023-04-28 ENCOUNTER — Other Ambulatory Visit: Payer: Self-pay | Admitting: Family Medicine

## 2023-04-28 DIAGNOSIS — E559 Vitamin D deficiency, unspecified: Secondary | ICD-10-CM

## 2023-05-12 ENCOUNTER — Ambulatory Visit: Payer: Managed Care, Other (non HMO) | Admitting: Family Medicine

## 2023-05-20 ENCOUNTER — Other Ambulatory Visit: Payer: Self-pay | Admitting: Family Medicine

## 2023-05-20 DIAGNOSIS — I10 Essential (primary) hypertension: Secondary | ICD-10-CM

## 2023-05-30 ENCOUNTER — Other Ambulatory Visit: Payer: Self-pay | Admitting: Family Medicine

## 2023-05-30 DIAGNOSIS — E88819 Insulin resistance, unspecified: Secondary | ICD-10-CM

## 2023-07-03 ENCOUNTER — Ambulatory Visit: Payer: Managed Care, Other (non HMO) | Admitting: Family Medicine

## 2023-07-03 ENCOUNTER — Encounter: Payer: Self-pay | Admitting: Family Medicine

## 2023-07-03 ENCOUNTER — Ambulatory Visit: Payer: Managed Care, Other (non HMO)

## 2023-07-03 VITALS — BP 127/78 | HR 84 | Temp 98.6°F | Ht 64.0 in | Wt 296.0 lb

## 2023-07-03 DIAGNOSIS — G8929 Other chronic pain: Secondary | ICD-10-CM

## 2023-07-03 DIAGNOSIS — E88819 Insulin resistance, unspecified: Secondary | ICD-10-CM

## 2023-07-03 DIAGNOSIS — R5383 Other fatigue: Secondary | ICD-10-CM

## 2023-07-03 DIAGNOSIS — I10 Essential (primary) hypertension: Secondary | ICD-10-CM | POA: Diagnosis not present

## 2023-07-03 DIAGNOSIS — E559 Vitamin D deficiency, unspecified: Secondary | ICD-10-CM | POA: Diagnosis not present

## 2023-07-03 DIAGNOSIS — Z6841 Body Mass Index (BMI) 40.0 and over, adult: Secondary | ICD-10-CM

## 2023-07-03 DIAGNOSIS — M170 Bilateral primary osteoarthritis of knee: Secondary | ICD-10-CM

## 2023-07-03 DIAGNOSIS — E669 Obesity, unspecified: Secondary | ICD-10-CM

## 2023-07-03 DIAGNOSIS — M25562 Pain in left knee: Secondary | ICD-10-CM

## 2023-07-03 DIAGNOSIS — M25561 Pain in right knee: Secondary | ICD-10-CM

## 2023-07-03 MED ORDER — VITAMIN D (ERGOCALCIFEROL) 1.25 MG (50000 UNIT) PO CAPS: 50000.0000 [IU] | ORAL_CAPSULE | ORAL | 0 refills | Status: DC

## 2023-07-03 NOTE — Progress Notes (Signed)
 Office: [phone]  /  Fax: [phone]  WEIGHT SUMMARY AND BIOMETRICS  Starting Date: 12/03/21  Starting Weight: 312lb   Weight Lost Since Last Visit: 0lb   Vitals Temp: 98.6 F (37 C) BP: 127/78 Pulse Rate: 84 SpO2: 96 %   Body Composition  Body Fat %: 56.1 % Fat Mass (lbs): 166.4 lbs Muscle Mass (lbs): 123.6 lbs Total Body Water (lbs): 98 lbs Visceral Fat Rating : 21     HPI  Chief Complaint: OBESITY  Stacey Castaneda is here to discuss her progress with her obesity treatment plan. She is on the keeping a food journal and adhering to recommended goals of 1800 calories and 120 protein and states she is following her eating plan approximately 10-20 % of the time. She states she is walking more per week.   Interval History:  Since last office visit she is up 3 lb She was last seen 4 mos ago This gives her a net weight loss of 16 lb in the past 1.5 years This is a 5% TBW loss without use of AOMs She admits to some poor food choices Barriers to progress have included lack of time due to job stress She has done more meal skipping-- usually lunch She has added in a protein shake for breakfast most day Husband has bought junk food snacks that are at home She has been getting in more fruits and veggies She enjoys more salty snacks She plans to add in more chair exercises at home  Pharmacotherapy: metformin XR 750 mg once daily with food (She did not tolerate 1000 mg dose)  PHYSICAL EXAM:  Blood pressure 127/78, pulse 84, temperature 98.6 F (37 C), height 5\' 4"  (1.626 m), weight 296 lb (134.3 kg), SpO2 96%. Body mass index is 50.81 kg/m.  General: She is overweight, cooperative, alert, well developed, and in no acute distress. PSYCH: Has normal mood, affect and thought process.   Lungs: Normal breathing effort, no conversational dyspnea.   ASSESSMENT AND PLAN  TREATMENT PLAN FOR OBESITY:  Recommended Dietary Goals  Stacey Castaneda is currently in the action  stage of change. As such, her goal is to continue weight management plan. She has agreed to keeping a food journal and adhering to recommended goals of 1800 calories and 120 g of protein and practicing portion control and making smarter food choices, such as increasing vegetables and decreasing simple carbohydrates.  Behavioral Intervention  We discussed the following Behavioral Modification Strategies today: increasing lean protein intake to established goals, increasing fiber rich foods, avoiding skipping meals, increasing water intake , work on meal planning and preparation, work on tracking and journaling calories using tracking application, keeping healthy foods at home, decreasing eating out or consumption of processed foods, and making healthy choices when eating convenient foods, practice mindfulness eating and understand the difference between hunger signals and cravings, work on managing stress, creating time for self-care and relaxation, avoiding temptations and identifying enticing environmental cues, planning for success, and continue to work on maintaining a reduced calorie state, getting the recommended amount of protein, incorporating whole foods, making healthy choices, staying well hydrated and practicing mindfulness when eating..  Additional resources provided today: NA  Recommended Physical Activity Goals  Charday has been advised to work up to 150 minutes of moderate intensity aerobic activity a week and strengthening exercises 2-3 times per week for cardiovascular health, weight loss maintenance and preservation of muscle mass.   She has agreed to Think about enjoyable ways to increase daily physical activity  and overcoming barriers to exercise and Increase physical activity in their day and reduce sedentary time (increase NEAT). Consider the addition of physical therapy for bilateral knee pain following her x-ray, ordered today Continue chair exercises from home at least 3 days  a week  Pharmacotherapy changes for the treatment of obesity: None  ASSOCIATED CONDITIONS ADDRESSED TODAY  Insulin resistance Working on weight loss, dietary changes with room for improvement With the importance of ramping up exercise time Tolerating metformin XR 750 mg once daily with food without adverse side effect Update labs today -     Insulin, random  Essential hypertension Blood pressure is well-controlled  -     Comprehensive metabolic panel  Vitamin D deficiency -     VITAMIN D 25 Hydroxy (Vit-D Deficiency, Fractures) -     Vitamin D (Ergocalciferol); Take 1 capsule (50,000 Units total) by mouth every 7 (seven) days.  Dispense: 12 capsule; Refill: 0  Other fatigue -     Comprehensive metabolic panel -     Vitamin B12  Chronic pain of both knees She complains of bilateral knee pain which has affected her ability to increase walking time.  This has impaired her weight loss over the past year and a half.  She reports bilateral leg weakness, working a sedentary job with little physical activity.  She denies any prior injury.  She agrees obtaining a bilateral knee x-rays today.  Will refer to PT if she does have findings of DJD with a long-term plan of increased walking time -     DG Knee 1-2 Views Left; Future -     DG Knee 1-2 Views Right; Future      She was informed of the importance of frequent follow up visits to maximize her success with intensive lifestyle modifications for her multiple health conditions.   ATTESTASTION STATEMENTS:  Reviewed by clinician on day of visit: allergies, medications, problem list, medical history, surgical history, family history, social history, and previous encounter notes pertinent to obesity diagnosis.   I have personally spent 30 minutes total time today in preparation, patient care, nutritional counseling and documentation for this visit, including the following: review of clinical lab tests; review of medical  tests/procedures/services.      Glennis Brink, DO DABFM, DABOM [facility] [address] [place], [place] [address] [phone]

## 2023-07-05 LAB — COMPREHENSIVE METABOLIC PANEL
ALT: 15 IU/L (ref 0–32)
AST: 20 IU/L (ref 0–40)
Albumin: 4.2 g/dL (ref 3.8–4.9)
Alkaline Phosphatase: 76 IU/L (ref 44–121)
BUN/Creatinine Ratio: 22 (ref 9–23)
BUN: 12 mg/dL (ref 6–24)
Bilirubin Total: 0.3 mg/dL (ref 0.0–1.2)
CO2: 22 mmol/L (ref 20–29)
Calcium: 9.1 mg/dL (ref 8.7–10.2)
Chloride: 101 mmol/L (ref 96–106)
Creatinine, Ser: 0.55 mg/dL — ABNORMAL LOW (ref 0.57–1.00)
Globulin, Total: 2.6 g/dL (ref 1.5–4.5)
Glucose: 84 mg/dL (ref 70–99)
Potassium: 4.3 mmol/L (ref 3.5–5.2)
Sodium: 137 mmol/L (ref 134–144)
Total Protein: 6.8 g/dL (ref 6.0–8.5)
eGFR: 110 mL/min/{1.73_m2} (ref >59)

## 2023-07-05 LAB — VITAMIN B12: Vitamin B-12: 443 pg/mL (ref 232–1245)

## 2023-07-05 LAB — INSULIN, RANDOM: INSULIN: 19.6 u[IU]/mL (ref 2.6–24.9)

## 2023-07-05 LAB — VITAMIN D 25 HYDROXY (VIT D DEFICIENCY, FRACTURES): Vit D, 25-Hydroxy: 48.1 ng/mL (ref 30.0–100.0)

## 2023-07-24 ENCOUNTER — Ambulatory Visit: Payer: Managed Care, Other (non HMO) | Admitting: Family Medicine

## 2023-07-24 ENCOUNTER — Encounter: Payer: Self-pay | Admitting: Family Medicine

## 2023-07-24 VITALS — BP 134/74 | HR 79 | Temp 98.4°F | Ht 64.0 in | Wt 299.0 lb

## 2023-07-24 DIAGNOSIS — E88819 Insulin resistance, unspecified: Secondary | ICD-10-CM

## 2023-07-24 DIAGNOSIS — E559 Vitamin D deficiency, unspecified: Secondary | ICD-10-CM

## 2023-07-24 DIAGNOSIS — J302 Other seasonal allergic rhinitis: Secondary | ICD-10-CM

## 2023-07-24 DIAGNOSIS — Z91119 Patient's noncompliance with dietary regimen due to unspecified reason: Secondary | ICD-10-CM

## 2023-07-24 DIAGNOSIS — M170 Bilateral primary osteoarthritis of knee: Secondary | ICD-10-CM

## 2023-07-24 DIAGNOSIS — E66813 Obesity, class 3: Secondary | ICD-10-CM

## 2023-07-24 DIAGNOSIS — Z6841 Body Mass Index (BMI) 40.0 and over, adult: Secondary | ICD-10-CM

## 2023-07-24 MED ORDER — METFORMIN HCL ER 750 MG PO TB24: 750.0000 mg | ORAL_TABLET | Freq: Every day | ORAL | 0 refills | Status: DC

## 2023-07-24 MED ORDER — VITAMIN D (ERGOCALCIFEROL) 1.25 MG (50000 UNIT) PO CAPS: 50000.0000 [IU] | ORAL_CAPSULE | ORAL | 0 refills | Status: DC

## 2023-07-24 NOTE — Progress Notes (Signed)
 Office: [phone]  /  Fax: [phone]  WEIGHT SUMMARY AND BIOMETRICS  Starting Date: 12/03/21  Starting Weight: 312lb   Weight Lost Since Last Visit: 0lb   Vitals Temp: 98.4 F (36.9 C) BP: 134/74 Pulse Rate: 79 SpO2: 98 %   Body Composition  Body Fat %: 56 % Fat Mass (lbs): 167.8 lbs Muscle Mass (lbs): 125.2 lbs Visceral Fat Rating : 21   HPI  Chief Complaint: OBESITY  Stacey Castaneda is here to discuss her progress with her obesity treatment plan. She is on the keeping a food journal and adhering to recommended goals of 1800 calories and 120 protein and states she is following her eating plan approximately 45-50 % of the time. She states she is exercising 0 minutes 0 times per week.  Interval History:  Since last office visit she is up 3 lb This gives her a net weight loss of 13 lb in 1.5 years of medically supervised weight management This is a 4.4% TBW loss She has been working a lot but is trying to make time for lunches, eating leftovers She has done better with breakfast choices Her husband hasn't been very supportive with healthy dinners She has reduced sweets at home and is working on portion control She did gain 1.6 lb of muscle mass She is still having R knee > L knee pain with limited walking Has tried chair exercise She is not logging her calories and is craving things like cake and cookies at night  Pharmacotherapy: metformin XR 750 mg daily  PHYSICAL EXAM:  Blood pressure 134/74, pulse 79, temperature 98.4 F (36.9 C), height 5\' 4"  (1.626 m), weight 299 lb (135.6 kg), SpO2 98%. Body mass index is 51.32 kg/m.  General: She is overweight, cooperative, alert, well developed, and in no acute distress. PSYCH: Has normal mood, affect and thought process.   Lungs: Normal breathing effort, no conversational dyspnea. Wearing a mask during visit with dry wheezy cough  ASSESSMENT AND PLAN  TREATMENT PLAN FOR OBESITY:  Recommended Dietary  Goals  Stacey Castaneda is currently in the action stage of change. As such, her goal is to continue weight management plan. She has agreed to keeping a food journal and adhering to recommended goals of 1800 calories and 100 g of  protein and practicing portion control and making smarter food choices, such as increasing vegetables and decreasing simple carbohydrates.  Behavioral Intervention  We discussed the following Behavioral Modification Strategies today: increasing lean protein intake to established goals, increasing fiber rich foods, increasing water intake , work on meal planning and preparation, work on tracking and journaling calories using tracking application, keeping healthy foods at home, decreasing eating out or consumption of processed foods, and making healthy choices when eating convenient foods, practice mindfulness eating and understand the difference between hunger signals and cravings, work on managing stress, creating time for self-care and relaxation, avoiding temptations and identifying enticing environmental cues, better snacking choices, and continue to work on maintaining a reduced calorie state, getting the recommended amount of protein, incorporating whole foods, making healthy choices, staying well hydrated and practicing mindfulness when eating..  Additional resources provided today: NA  Recommended Physical Activity Goals  Stacey Castaneda has been advised to work up to 150 minutes of moderate intensity aerobic activity a week and strengthening exercises 2-3 times per week for cardiovascular health, weight loss maintenance and preservation of muscle mass.   She has agreed to Think about enjoyable ways to increase daily physical activity and overcoming barriers to exercise  and Increase physical activity in their day and reduce sedentary time (increase NEAT). Chair exercise and short walks encouraged daily  Pharmacotherapy changes for the treatment of obesity: none Insurance does  not cover AOMs  ASSOCIATED CONDITIONS ADDRESSED TODAY  Insulin resistance Improving Reviewed labs with patient from last visit Fasting insulin improved from 30.4--> 26--> 19.6 Still showing IR but is actively working on lifestyle changes Weight loss has been slower than expected due to difficulty with compliance Tolerating metformin XR 750 mg daily (but not the 1000 mg dose)  -     metFORMIN HCl ER; Take 1 tablet (750 mg total) by mouth daily with breakfast.  Dispense: 90 tablet; Refill: 0  Vitamin D deficiency Last vitamin D Lab Results  Component Value Date   VD25OH 48.1 07/03/2023   Reviewed lab results with patient Vitamin D level improved on RX vitamin D weekly.  Will move to q 14 days and recheck late July -     Vitamin D (Ergocalciferol); Take 1 capsule (50,000 Units total) by mouth every 14 (fourteen) days.  Dispense: 6 capsule; Refill: 0  Class 3 severe obesity due to excess calories with serious comorbidity and body mass index (BMI) of 50.0 to 59.9 in adult [facility])  Seasonal allergies Worsening with springtime Taking Zyrtec generic, Flonase 2 sprays per nostril daily and seen an increase in dry cough with hx of asthma but has not been using her Breo daily.  Using Albuterol HFA.  Recommend adding back Breo daily and f/u with PCP for further management  Primary osteoarthritis of both knees Reviewed xray results with patient from 07/03/23 She has mild tricompartmental degeneration of both knees Knee pain and weakness has limited her walking time which has been a factor in her slow weight loss Offered PT but she declined Offered ref to Sports med but she declined Continue active plan for weight loss, short walks during day and chair exercises daily  Nonadherence with dietary restriction She has continued to struggle with poor food choices, excess amounts of sweets with a poor support at home, trouble taking time for self care and meal planning.  We discussed portion  control options and alternatives.  She is welcome to see an RD for further management.     She was informed of the importance of frequent follow up visits to maximize her success with intensive lifestyle modifications for her multiple health conditions.   ATTESTASTION STATEMENTS:  Reviewed by clinician on day of visit: allergies, medications, problem list, medical history, surgical history, family history, social history, and previous encounter notes pertinent to obesity diagnosis.   I have personally spent 30 minutes total time today in preparation, patient care, nutritional counseling and documentation for this visit, including the following: review of clinical lab tests; review of medical tests/procedures/services.      Glennis Brink, DO DABFM, DABOM [facility] [address] [place], [place] [address] [phone]

## 2023-08-08 ENCOUNTER — Other Ambulatory Visit: Payer: Self-pay | Admitting: Family Medicine

## 2023-09-11 ENCOUNTER — Ambulatory Visit: Admitting: Family Medicine

## 2023-09-11 ENCOUNTER — Encounter: Payer: Self-pay | Admitting: Family Medicine

## 2023-09-11 ENCOUNTER — Other Ambulatory Visit (HOSPITAL_BASED_OUTPATIENT_CLINIC_OR_DEPARTMENT_OTHER): Payer: Self-pay

## 2023-09-11 VITALS — BP 133/83 | HR 98 | Temp 98.0°F | Ht 64.0 in | Wt 300.0 lb

## 2023-09-11 DIAGNOSIS — E66813 Obesity, class 3: Secondary | ICD-10-CM

## 2023-09-11 DIAGNOSIS — M170 Bilateral primary osteoarthritis of knee: Secondary | ICD-10-CM | POA: Diagnosis not present

## 2023-09-11 DIAGNOSIS — E88819 Insulin resistance, unspecified: Secondary | ICD-10-CM

## 2023-09-11 DIAGNOSIS — Z6841 Body Mass Index (BMI) 40.0 and over, adult: Secondary | ICD-10-CM | POA: Diagnosis not present

## 2023-09-11 DIAGNOSIS — Z91119 Patient's noncompliance with dietary regimen due to unspecified reason: Secondary | ICD-10-CM

## 2023-09-11 MED ORDER — ZEPBOUND 2.5 MG/0.5ML ~~LOC~~ SOAJ
2.5000 mg | SUBCUTANEOUS | 0 refills | Status: DC
  Filled 2023-09-11: qty 2, 28d supply, fill #0

## 2023-09-11 NOTE — Progress Notes (Signed)
 Office: [phone]  /  Fax: [phone]  WEIGHT SUMMARY AND BIOMETRICS  No data recorded  No data recorded   No data recorded   No data recorded  No data recorded   HPI  Chief Complaint: OBESITY  Stacey Castaneda is here to discuss her progress with her obesity treatment plan. She is on the keeping a food journal and adhering to recommended goals of 1800 calories and 120 protein and states she is following her eating plan approximately 30-40 % of the time. She states she is walking more.   Interval History:  Since last office visit she is up 1 lb This gives her a net weight loss of 6 lb in the past 1.5 years She now has a little more free time at work to eat lunch and take a nap She is slightly more active R>L knee pain is stable but walking time has not improved She has had improvements in her breathing and allergies She has some hunger and cravings She got tired of [employer] Her husband still occasionally brings home sweets She has been opposed to using AOMs or bariatric surgery in the past  Pharmacotherapy: metformin  XR 750 mg daily  PHYSICAL EXAM:  Blood pressure 133/83, pulse 98, temperature 98 F (36.7 C), height 5\' 4"  (1.626 m), weight 300 lb (136.1 kg), SpO2 97%. Body mass index is 51.49 kg/m.  General: She is overweight, cooperative, alert, well developed, and in no acute distress. PSYCH: Has normal mood, affect and thought process.   Lungs: Normal breathing effort, no conversational dyspnea.   ASSESSMENT AND PLAN  TREATMENT PLAN FOR OBESITY:  Recommended Dietary Goals  Stacey Castaneda is currently in the action stage of change. As such, her goal is to continue weight management plan. She has agreed to keeping a food journal and adhering to recommended goals of 1700 calories and 120 g of  protein and practicing portion control and making smarter food choices, such as increasing vegetables and decreasing simple carbohydrates.  Behavioral Intervention  We  discussed the following Behavioral Modification Strategies today: increasing lean protein intake to established goals, increasing fiber rich foods, increasing water intake , work on meal planning and preparation, work on tracking and journaling calories using tracking application, reading food labels , keeping healthy foods at home, practice mindfulness eating and understand the difference between hunger signals and cravings, avoiding temptations and identifying enticing environmental cues, planning for success, and continue to work on maintaining a reduced calorie state, getting the recommended amount of protein, incorporating whole foods, making healthy choices, staying well hydrated and practicing mindfulness when eating..  Additional resources provided today: NA  Recommended Physical Activity Goals  Stacey Castaneda has been advised to work up to 150 minutes of moderate intensity aerobic activity a week and strengthening exercises 2-3 times per week for cardiovascular health, weight loss maintenance and preservation of muscle mass.   She has agreed to Think about enjoyable ways to increase daily physical activity and overcoming barriers to exercise and Increase physical activity in their day and reduce sedentary time (increase NEAT).  Pharmacotherapy changes for the treatment of obesity: begin Zepbound  2.5 mg weekly Patient denies a personal or family history of pancreatitis, medullary thyroid  carcinoma or multiple endocrine neoplasia type II. Recommend reviewing pen training video online.  ASSOCIATED CONDITIONS ADDRESSED TODAY  Primary osteoarthritis of both knees Stable Bilateral knee pain has limited her ability to walk distance for exercise She is able to walk without use of a cane and is not on any  RX NSAIDs She plans to return to see ortho if she can get her BMI down Weight loss has been slower than expected over 1.5 years She has not followed through with exercise alternatives like chair  yoga, swimming, bike, etc  Continue active plan for BMI reduction, discussed the role for bariatric surgery. Begin chair exercise and short walks daily  Class 3 severe obesity due to excess calories with body mass index (BMI) of 50.0 to 59.9 in adult -     Zepbound ; Inject 2.5 mg into the skin once a week.  Dispense: 2 mL; Refill: 0 She is open minded to use of Zepbound  Reviewed MOA and potential adverse SE Reviewed website for pen training video  Insulin  resistance Improving Fasting insulin  improving from 30.4--> 26--> 19.6 She is tolerating metformin  XR 750 mg daily  She did not tolerate 1000 mg daily  Continue to work on limiting intake of sugar and starches, weight reduction and increasing physical activity  Nonadherence with dietary restriction Stable She has struggled to comply with recommended dietary changes even with an improvement in free time from work this past month.  She does lack a good support system at home.  Recommend using a dietary tracking ap to log daily intake Reviewed options for lean protein, easy meals and snacks   She was informed of the importance of frequent follow up visits to maximize her success with intensive lifestyle modifications for her multiple health conditions.   ATTESTASTION STATEMENTS:  Reviewed by clinician on day of visit: allergies, medications, problem list, medical history, surgical history, family history, social history, and previous encounter notes pertinent to obesity diagnosis.   I have personally spent 32 minutes total time today in preparation, patient care, nutritional counseling and education,  and documentation for this visit, including the following: review of most recent clinical lab tests, prescribing medications/ refilling medications, reviewing medical assistant documentation, review and interpretation of bioimpedence results.     Micky Albee, D.O. DABFM, DABOM [facility] [address] [place], [place] [address] [phone]

## 2023-09-12 ENCOUNTER — Ambulatory Visit: Admitting: Family Medicine

## 2023-09-12 ENCOUNTER — Encounter: Payer: Self-pay | Admitting: Family Medicine

## 2023-09-12 VITALS — BP 149/72 | HR 103 | Resp 18 | Ht 64.0 in | Wt 306.2 lb

## 2023-09-12 DIAGNOSIS — E88819 Insulin resistance, unspecified: Secondary | ICD-10-CM | POA: Diagnosis not present

## 2023-09-12 DIAGNOSIS — F419 Anxiety disorder, unspecified: Secondary | ICD-10-CM | POA: Diagnosis not present

## 2023-09-12 DIAGNOSIS — I10 Essential (primary) hypertension: Secondary | ICD-10-CM

## 2023-09-12 DIAGNOSIS — F32A Depression, unspecified: Secondary | ICD-10-CM

## 2023-09-12 DIAGNOSIS — G4733 Obstructive sleep apnea (adult) (pediatric): Secondary | ICD-10-CM

## 2023-09-12 DIAGNOSIS — J4540 Moderate persistent asthma, uncomplicated: Secondary | ICD-10-CM | POA: Diagnosis not present

## 2023-09-12 LAB — POCT GLYCOSYLATED HEMOGLOBIN (HGB A1C): Hemoglobin A1C: 5.3 % (ref 4.0–5.6)

## 2023-09-12 MED ORDER — OLMESARTAN-AMLODIPINE-HCTZ 40-10-25 MG PO TABS: 1.0000 | ORAL_TABLET | Freq: Every day | ORAL | 3 refills | Status: DC

## 2023-09-12 MED ORDER — FLUTICASONE FUROATE-VILANTEROL 100-25 MCG/ACT IN AEPB: 1.0000 | INHALATION_SPRAY | Freq: Every day | RESPIRATORY_TRACT | 3 refills | Status: DC

## 2023-09-12 MED ORDER — ALBUTEROL SULFATE HFA 108 (90 BASE) MCG/ACT IN AERS: 2.0000 | INHALATION_SPRAY | RESPIRATORY_TRACT | 4 refills | Status: DC | PRN

## 2023-09-12 MED ORDER — AMBULATORY NON FORMULARY MEDICATION: 0 refills | Status: AC

## 2023-09-12 NOTE — Addendum Note (Signed)
 Addended by: Colton Tassin D on: 09/12/2023 03:00 PM   Modules accepted: Orders

## 2023-09-12 NOTE — Assessment & Plan Note (Signed)
 Will call [employer] to see what pressure she is set on I believe it is around 13 but I want to confirm that we could turn it up 2 points and see if she does better with that.

## 2023-09-12 NOTE — Assessment & Plan Note (Signed)
 Bps have been well controlled.

## 2023-09-12 NOTE — Assessment & Plan Note (Signed)
 She looks fantastic today at 5.3.  Otherwise doing well plan to follow-up in 6 months.

## 2023-09-12 NOTE — Progress Notes (Signed)
 Established Patient Office Visit  Subjective  Patient ID: Stacey Castaneda, female    DOB: 06-Apr-1971  Age: 53 y.o. MRN: [id number]  Chief Complaint  Patient presents with   Medical Management of Chronic Issues    6 mon f/u HTN and PDM last A1C 5.6    HPI  Hypertension- Pt denies chest pain, SOB, dizziness, or heart palpitations.  Taking meds as directed w/o problems.  Denies medication side effects.    Impaired fasting glucose-no increased thirst or urination. No symptoms consistent with hypoglycemia.  She feels like her CPAP needs to be adjusted slightly.  She gets her device through [place].  Likes the ramp up time that seems to work well is just that after a while when she is been lying there does not feel like it is pushing out enough pressure.   She [facility]    ROS    Objective:     BP (!) 149/72 (BP Location: Left Arm, Patient Position: Sitting)   Pulse (!) 103   Resp 18   Ht 5\' 4"  (1.626 m)   Wt (!) 306 lb 4 oz (138.9 kg)   SpO2 97%   BMI 52.57 kg/m    Physical Exam Vitals and nursing note reviewed.  Constitutional:      Appearance: Normal appearance.  HENT:     Head: Normocephalic and atraumatic.  Eyes:     Conjunctiva/sclera: Conjunctivae normal.  Cardiovascular:     Rate and Rhythm: Normal rate and regular rhythm.  Pulmonary:     Effort: Pulmonary effort is normal.     Breath sounds: No wheezing.     Comments: Coarse BS Bilaterally Skin:    General: Skin is warm and dry.  Neurological:     Mental Status: She is alert.  Psychiatric:        Mood and Affect: Mood normal.      Results for orders placed or performed in visit on 09/12/23  POCT HgB A1C  Result Value Ref Range   Hemoglobin A1C 5.3 4.0 - 5.6 %   HbA1c POC (<> result, manual entry)     HbA1c, POC (prediabetic range)     HbA1c, POC (controlled diabetic range)        The 10-year ASCVD risk score (Arnett DK, et al., 2019) is:  2.9%    Assessment & Plan:   Problem List Items Addressed This Visit       Cardiovascular and Mediastinum   Essential hypertension - Primary   Bps have been well controlled.        Relevant Medications   Olmesartan -amLODIPine -HCTZ 40-10-25 MG TABS     Respiratory   Reactive airway disease   Relevant Medications   fluticasone  furoate-vilanterol (BREO ELLIPTA ) 100-25 MCG/ACT AEPB   albuterol  (VENTOLIN  HFA) 108 (90 Base) MCG/ACT inhaler   Obstructive sleep apnea   Will call [employer] to see what pressure she is set on I believe it is around 13 but I want to confirm that we could turn it up 2 points and see if she does better with that.        Endocrine   Insulin  resistance   She looks fantastic today at 5.3.  Otherwise doing well plan to follow-up in 6 months.      Relevant Orders   POCT HgB A1C (Completed)     Other   Anxiety and depression   PHQ-9 score of 13 and GAD-7 score of 12  today.  A lot of it is the current political environment and sometimes she just has to separate herself from the news.  But overall she feels like she is doing okay with the Zoloft  and Wellbutrin  and does not want a make any adjustments or changes today.       Return in about 6 months (around 03/14/2024) for Hypertension, Diabetes follow-up, OSA.    Stacey German, MD

## 2023-09-12 NOTE — Assessment & Plan Note (Signed)
 PHQ-9 score of 13 and GAD-7 score of 12 today.  A lot of it is the current political environment and sometimes she just has to separate herself from the news.  But overall she feels like she is doing okay with the Zoloft  and Wellbutrin  and does not want a make any adjustments or changes today.

## 2023-09-15 ENCOUNTER — Telehealth: Payer: Self-pay | Admitting: *Deleted

## 2023-09-15 NOTE — Telephone Encounter (Signed)
 Prior authorization done via cover my meds for patients Zepbound . It is not a covered benefit.

## 2023-09-26 ENCOUNTER — Ambulatory Visit: Payer: Managed Care, Other (non HMO) | Admitting: Family Medicine

## 2023-10-09 ENCOUNTER — Ambulatory Visit: Admitting: Family Medicine

## 2023-11-05 ENCOUNTER — Encounter: Payer: Self-pay | Admitting: Nurse Practitioner

## 2023-11-05 ENCOUNTER — Other Ambulatory Visit (HOSPITAL_BASED_OUTPATIENT_CLINIC_OR_DEPARTMENT_OTHER): Payer: Self-pay

## 2023-11-05 ENCOUNTER — Ambulatory Visit: Admitting: Nurse Practitioner

## 2023-11-05 VITALS — BP 134/82 | HR 84 | Temp 98.2°F | Ht 64.0 in | Wt 304.0 lb

## 2023-11-05 DIAGNOSIS — Z6841 Body Mass Index (BMI) 40.0 and over, adult: Secondary | ICD-10-CM | POA: Diagnosis not present

## 2023-11-05 DIAGNOSIS — E559 Vitamin D deficiency, unspecified: Secondary | ICD-10-CM

## 2023-11-05 DIAGNOSIS — E66813 Obesity, class 3: Secondary | ICD-10-CM | POA: Diagnosis not present

## 2023-11-05 MED ORDER — WEGOVY 0.25 MG/0.5ML ~~LOC~~ SOAJ
0.2500 mg | SUBCUTANEOUS | 0 refills | Status: DC
  Filled 2023-11-05: qty 2, 28d supply, fill #0

## 2023-11-05 MED ORDER — VITAMIN D (ERGOCALCIFEROL) 1.25 MG (50000 UNIT) PO CAPS: 50000.0000 [IU] | ORAL_CAPSULE | ORAL | 0 refills | Status: DC

## 2023-11-05 NOTE — Progress Notes (Unsigned)
 Office: [phone]  /  Fax: [phone]  WEIGHT SUMMARY AND BIOMETRICS  Weight Lost Since Last Visit: 0lb  Weight Gained Since Last Visit: 4lb   Vitals Temp: 98.2 F (36.8 C) BP: 134/82 Pulse Rate: 84 SpO2: 96 %   Anthropometric Measurements Height: 5' 4 (1.626 m) Weight: (!) 304 lb (137.9 kg) BMI (Calculated): 52.16 Weight at Last Visit: 300lb Weight Lost Since Last Visit: 0lb Weight Gained Since Last Visit: 4lb Starting Weight: 312lb Total Weight Loss (lbs): 8 lb (3.629 kg)   Body Composition  Body Fat %: 57.8 % Fat Mass (lbs): 175.8 lbs Muscle Mass (lbs): 121.8 lbs Visceral Fat Rating : 22   Other Clinical Data Fasting: No Labs: No Today's Visit #: 23 Starting Date: 12/03/21     HPI  Chief Complaint: OBESITY  Stacey Castaneda is here to discuss her progress with her obesity treatment plan. She is on the keeping a food journal and adhering to recommended goals of 1800 calories and 120 protein and states she is following her eating plan approximately 30 % of the time. She states she is exercising varying minutes 1 days every other week.   Interval History:  Since last office visit she has gained 4 pounds. She notes polyphagia and cravings. She is eating 2 meals per day (lunch=sandwich with pimento cheese with tomato or salad ([employer]), chili ([employer]), dense bean salad or chicken/steak combo from [employer] and dinners: she notes she barely cooks-eating out a lot and using [employer]).  She is drinking 1-2 cups coffee with protein powder (15 grams protein per each cup) for breakfast. She has been trying to eat more vegetables. She has been working 13 hours per day but feels that her hours are getting better. She struggles with walking due to lack of energy, back pain, ankle pain and knee pain.  She feels that she should be doing more. She is drinking coffee, fairlife milk and water daily.     She reports she did well with Weight Watchers in the past.  Her  husband did it with her and found that to be helpful.  She is struggling now because she is tired.  Her husband is supportive of her losing weight but sabotages her weight loss. He is bringing in food that doesn't help with her losing weight and they both eat out a lot.     Pharmacotherapy for weight loss: She is not currently taking medications  for medical weight loss.      Previous pharmacotherapy for medical weight loss:  None  Bariatric surgery:  Patient has not had bariatric surgery.    Vit D deficiency  She is taking Vit D 50,000 IU weekly.  Denies side effects.  Denies nausea, vomiting or muscle weakness.    Lab Results  Component Value Date   VD25OH 48.1 07/03/2023   VD25OH 41.7 12/16/2022   VD25OH 36.4 08/19/2022      PHYSICAL EXAM:  Blood pressure 134/82, pulse 84, temperature 98.2 F (36.8 C), height 5' 4 (1.626 m), weight (!) 304 lb (137.9 kg), SpO2 96%. Body mass index is 52.18 kg/m.  General: She is overweight, cooperative, alert, well developed, and in no acute distress. PSYCH: Has normal mood, affect and thought process.   Extremities: No edema.  Neurologic: No gross sensory or motor deficits. No tremors or fasciculations noted.    DIAGNOSTIC DATA REVIEWED:  BMET    Component Value Date/Time   NA 137 07/03/2023 1122   K 4.3 07/03/2023 1122  CL 101 07/03/2023 1122   CO2 22 07/03/2023 1122   GLUCOSE 84 07/03/2023 1122   GLUCOSE 82 06/29/2021 1544   BUN 12 07/03/2023 1122   CREATININE 0.55 (L) 07/03/2023 1122   CREATININE 0.65 06/29/2021 1544   CALCIUM 9.1 07/03/2023 1122   GFRNONAA 92 07/27/2020 1256   GFRAA 107 07/27/2020 1256   Lab Results  Component Value Date   HGBA1C 5.3 09/12/2023   HGBA1C 5.4 10/14/2013   Lab Results  Component Value Date   INSULIN  19.6 07/03/2023   INSULIN  30.4 (H) 04/25/2022   Lab Results  Component Value Date   TSH 1.240 12/16/2022   CBC    Component Value Date/Time   WBC 7.0 12/16/2022 0837   WBC 8.9  01/26/2014 1515   RBC 4.29 12/16/2022 0837   RBC 4.80 01/26/2014 1515   HGB 11.7 12/16/2022 0837   HCT 35.5 12/16/2022 0837   PLT 298 12/16/2022 0837   MCV 83 12/16/2022 0837   MCH 27.3 12/16/2022 0837   MCH 29.4 01/26/2014 1515   MCHC 33.0 12/16/2022 0837   MCHC 34.4 01/26/2014 1515   RDW 14.2 12/16/2022 0837   Iron Studies No results found for: IRON, TIBC, FERRITIN, IRONPCTSAT Lipid Panel     Component Value Date/Time   CHOL 181 12/16/2022 0837   TRIG 77 12/16/2022 0837   HDL 51 12/16/2022 0837   CHOLHDL 3.5 12/16/2022 0837   CHOLHDL 2.6 12/07/2020 0000   VLDL 21 05/21/2016 0802   LDLCALC 116 (H) 12/16/2022 0837   LDLCALC 86 12/07/2020 0000   LDLDIRECT 111 (H) 02/20/2012 0805   Hepatic Function Panel     Component Value Date/Time   PROT 6.8 07/03/2023 1122   ALBUMIN 4.2 07/03/2023 1122   AST 20 07/03/2023 1122   ALT 15 07/03/2023 1122   ALKPHOS 76 07/03/2023 1122   BILITOT 0.3 07/03/2023 1122      Component Value Date/Time   TSH 1.240 12/16/2022 0837   TSH 1.070 12/03/2021 1036   Nutritional Lab Results  Component Value Date   VD25OH 48.1 07/03/2023   VD25OH 41.7 12/16/2022   VD25OH 36.4 08/19/2022     ASSESSMENT AND PLAN  TREATMENT PLAN FOR OBESITY:  Recommended Dietary Goals  Stacey Castaneda is currently in the action stage of change. As such, her goal is to continue weight management plan. She has agreed to keeping a food journal and adhering to recommended goals of 1800 calories and 120+ grams of protein. I've recommended that she track and will review macros at her next visit.  We had a long discussion today about reducing eating out, meal prepping and also the cost of simply eating out as much as she is.  She would benefit from a meal prep company.  She has used [employer] in the past.  Clean eat would might be a good option for her in the future.  Behavioral Intervention  We discussed the following Behavioral Modification Strategies today:  increasing lean protein intake to established goals, decreasing simple carbohydrates , increasing vegetables, increasing fiber rich foods, avoiding skipping meals, increasing water intake , work on tracking and journaling calories using tracking application, and continue to work on maintaining a reduced calorie state, getting the recommended amount of protein, incorporating whole foods, making healthy choices, staying well hydrated and practicing mindfulness when eating..  Additional resources provided today: NA  Recommended Physical Activity Goals  Fiza has been advised to work up to 150 minutes of moderate intensity aerobic activity a week and strengthening  exercises 2-3 times per week for cardiovascular health, weight loss maintenance and preservation of muscle mass.   She has agreed to Think about enjoyable ways to increase daily physical activity and overcoming barriers to exercise, Increase physical activity in their day and reduce sedentary time (increase NEAT)., Work on scheduling and tracking physical activity. , and continue to gradually increase the amount and intensity of exercise routine   Pharmacotherapy We discussed various medication options to help Ketty with her weight loss efforts and we both agreed to start [facility] 0.25mg .  Side effects discussed.  Contraindications: Pancreatitis (active gallstones) Medullary thyroid  cancer High triglycerides (>500)-will need labs prior to starting Multiple Endocrine Neoplasia syndrome type 2 (MEN 2) Trying to get pregnant Breastfeeding Use with caution with taking insulin  or sulfonylureas (will need to monitor blood sugars for hypoglycemia)  Avoid orlistat due to history of vitamin D  deficiency Would need to use phentermine  with caution due to hypertension-I do not currently recommend high-dose phentermine .  ASSOCIATED CONDITIONS ADDRESSED TODAY  Action/Plan  Vitamin D  deficiency -     Vitamin D  (Ergocalciferol ); Take 1  capsule (50,000 Units total) by mouth every 14 (fourteen) days.  Dispense: 6 capsule; Refill: 0  Class 3 severe obesity due to excess calories with body mass index (BMI) of 50.0 to 59.9 in adult -     Tzhncb; Inject 0.25 mg into the skin once a week.  Dispense: 2 mL; Refill: 0       Patient is interested in more information about bariatric surgery .  I referred her to [facility] website to review video for bariatric surgery.  Return in about 4 weeks (around 12/03/2023).SABRA She was informed of the importance of frequent follow up visits to maximize her success with intensive lifestyle modifications for her multiple health conditions.   ATTESTASTION STATEMENTS:  Reviewed by clinician on day of visit: allergies, medications, problem list, medical history, surgical history, family history, social history, and previous encounter notes.     Corean SAUNDERS. Jennylee Uehara FNP-C

## 2023-11-06 ENCOUNTER — Other Ambulatory Visit (HOSPITAL_BASED_OUTPATIENT_CLINIC_OR_DEPARTMENT_OTHER): Payer: Self-pay

## 2023-11-06 ENCOUNTER — Telehealth: Payer: Self-pay

## 2023-11-06 NOTE — Patient Instructions (Addendum)
 What is a GLP-1 Glucagon like peptide-1 (GLP-1) agonists represent a class of medications used to treat type 2 diabetes mellitus and obesity.  GLP-1 medications mimic the action of a hormone called glucagon like peptide 1.  When blood sugar levels start to rise/increase these drugs stimulate the body to produce more insulin.  When that happens, the extra insulin helps to lower the blood sugar levels in the body.  This in returns helps with decreasing cravings.  These medications also slow the movement of food from the stomach into the small intestine.  This in return helps one to full faster and longer.   Diabetic medications: Approved for treatment of diabetes mellitus but does not have full approval for weight loss use Victoza (liraglutide) Ozempic (semaglutide) Mounjaro Trulicity Rybelsus  Weight loss medications: Approved for long-term weight loss use.        Saxenda (liraglutide) Wegovy (semaglutide) Zepbound Contraindications:  Pancreatitis (active gallstones) Medullary thyroid cancer High triglycerides (>500)-will need labs prior to starting Multiple Endocrine Neoplasia syndrome type 2 (MEN 2) Trying to get pregnant Breastfeeding Use with caution with taking insulin or sulfonylureas (will need to monitor blood sugars for hypoglycemia) Side effects (most common): Most common side effects are nausea, gas, bloating and constipation.  Other possible side effects are headaches, belching, diarrhea, tiredness (fatigue), vomiting, upset stomach, dizziness, heartburn and stomach (abdominal pain).  If you think that you are becoming dehydrated, please inform our office or your primary family provider.  Stop immediately and go to ER if you have any symptoms of a serious allergic reaction including swelling of your face, lips, tongue or throat; problems breathing or swallowing; severe rash or itching; fainting or feeling dizzy; or very rapid heart rate.                                                                                          Steps to starting your [facility]  The office staff will send a prior authorization request to your insurance company for approval. We will send you a mychart message once we hear back from your insurance with a decision.  This can take up to 7-10 business days.   Once your WegovyTis approved, you may then pick up [facility] pen from your pharmacy.    Learn how to do Wegovy injections on the [employer] website. There is a training video that will walk you through how to safely perform the injection. If you have questions for our clinical staff, please contact our  clinical staff. If you have any symptoms of allergic reaction to [facility] discontinue immediately and call 911.  1. What should I tell my provider before using WegovyT ? have or have had problems with your pancreas or kidneys. have type 2 diabetes and a history of diabetic retinopathy. have or have had depression, suicidal thoughts, or mental health issues. are pregnant or plan to become pregnant. Joesphine Bare may harm your unborn baby. You should stop using WegovyT 3 months before you plan to become pregnant or if you are breastfeeding or plan to breastfeed. It is not known if WegovyT passes into your breast milk.  2. What is [place] and  how does it work?  Joesphine Bare is an injectable prescription medication prescribed by your provider to help with your weight loss.  This medicine will be most effective when combined with a reduced calorie diet and physical activity.  Joesphine Bare is not for the treatment of type 2 diabetes mellitus. Joesphine Bare should not be used with other GLP-1 receptor agonist medicines. The addition of WegovyT in  patients treated with insulin has not been evaluated. When initiating WegovyT, consider reducing the dose of concomitantly administered insulin secretagogues (such as sulfonylureas) or insulin to reduce the risk of  hypoglycemia.  One role of GLP-1 is to send a signal to your  brain to tell it you are full. It also slows down stomach emptying which will make you feel full longer and may help with reducing cravings.   3.  How should I take WegovyT?  Administer WegovyT once weekly, on the same day each week, at any time of day, with or without meals Inject subcutaneously in the abdomen, thigh or upper arm Initiate at 0.25 mg once weekly for 4 weeks. In 4 week intervals, increase the dose until a dose of 2.4 mg is reached (we will discuss with you the dosage at each visit). The maintenance dose of WegovyT is 2.4 mg once weekly.  The dosing schedule of Wegovy is:  0.25 mg per week X 4 weeks 0.5 mg per week X 4 weeks 1.0 mg per week X 4 weeks 1.7 mg per week X 4 weeks 2.4 mg per week   Missed dose   If you miss your injection day, go ahead inject your current dose. You can go >7 days, but not <7 days between injections. You may change your injection day (It must be >7 days). If you miss >2 doses, you can still keep next injection dose the same or follow de-escalation schedule which may minimize GI symptoms.   In patients with type 2 diabetes, monitor blood glucose prior to starting and during WEGOVYT treatment.   Inject your dose of Wegovy under the skin (subcutaneous injection) in your stomach area (abdomen), upper leg (thigh) or upper arm. Do not inject into a vein or a muscle. The injection site should be rotated and not given in the same spot each day. Hold the needle under the skin and count to "10". This will allow all of the medicine to be dispensed under the skin. Always wipe your skin with an alcohol prep pad before injection  Dispose of used pen in an approved sharps container. More practical options that can be put in the trash  to go to the landfill are milk jugs or plastic laundry detergent containers with a screw on lid.  What side effects may I notice from taking WegovyT?  Side effects that usually do not require medical attention (report to our  office if they continue or are bothersome): Nausea (most common but decreases over time in most people as their body gets used to the medicine) Diarrhea Constipation (you may take an over the counter laxative if needed) Headache Decreased appetite Upset stomach Tiredness Dizziness Feeling bloated Hair loss Belching Gas Heartburn  Side effects that you should call 911 as soon as possible Vomiting Stomach pain Fever Yellowing of your skin or eyes  Clay-colored stools Increased heart rate while at rest Low blood sugar  Sudden changes in mood, behaviors, thoughts, feelings, or thoughts of suicide If you get a lump or swelling in your neck, hoarseness, trouble swallowing, or shortness of breath. Allergic  reaction such as skin rash, itching, hives, swelling of the face, tongue, or lips  Helpful tips for managing nausea Nausea is a common side effect when first starting WegovyT. If you experience nausea, be sure to connect with your health care provider. He or she will offer guidance on ways to manage it, which may include: Eat bland, low-fat foods, like crackers, toast and rice  Eat foods that contain water, like soups and gelatin  Avoid lying down after you eat  Go outdoors for fresh air  Eat more slowly    Other important information Do not drop your pen or knock it against hard surfaces  Do not expose your pen to any liquids  If you think that your pen may be damaged, do not try to fix it. Use a new one Keep the pen cap on until you are ready to inject. Your pen will no longer be sterile if you store an unused pen without the cap, if you pull the pen cap off and put it on again, or if the pen cap is missing. This could lead to an infection  Store the [place] pen in the refrigerator from 47F to 47F (2C to 8C) If needed, before removing the pen cap, WegovyT can be stored from 8C to 30C (47F to 21F) in the original carton for up to 28 days.  Keep WegovyT in the original  carton to protect it from light  Do not freeze  Throw away pen if WegovyT has been frozen, has been exposed to light or temperatures above 21F (30C), or has been out of the refrigerator for 28 days or longer It's important to properly dispose of your used WegovyT pens. Do not throw the pen away in your household trash. Instead, use an FDA-cleared sharps disposable container or a sturdy household container with a tight-fitting lid, like a heavy duty plastic container.   ZOXWRU pen training website: [employer]astyTh[employer]  Wegovy savings and support link: achegone.com

## 2023-11-06 NOTE — Telephone Encounter (Signed)
 PA submitted through Cover My Meds for [facility]. Key: [id number]  Per patients insurance: Your patient will pay 100% of a discounted price for this medication. Any amount the patient pays will not apply to their deductible or out-of-pocket expenses

## 2023-12-05 ENCOUNTER — Other Ambulatory Visit (HOSPITAL_BASED_OUTPATIENT_CLINIC_OR_DEPARTMENT_OTHER): Payer: Self-pay

## 2023-12-11 ENCOUNTER — Ambulatory Visit: Admitting: Family Medicine

## 2023-12-11 ENCOUNTER — Other Ambulatory Visit (HOSPITAL_BASED_OUTPATIENT_CLINIC_OR_DEPARTMENT_OTHER): Payer: Self-pay

## 2023-12-11 ENCOUNTER — Telehealth: Payer: Self-pay

## 2023-12-11 ENCOUNTER — Encounter: Payer: Self-pay | Admitting: Family Medicine

## 2023-12-11 VITALS — BP 136/79 | HR 84 | Temp 98.2°F | Ht 64.0 in | Wt 304.0 lb

## 2023-12-11 DIAGNOSIS — J302 Other seasonal allergic rhinitis: Secondary | ICD-10-CM | POA: Diagnosis not present

## 2023-12-11 DIAGNOSIS — G4733 Obstructive sleep apnea (adult) (pediatric): Secondary | ICD-10-CM | POA: Diagnosis not present

## 2023-12-11 DIAGNOSIS — R1312 Dysphagia, oropharyngeal phase: Secondary | ICD-10-CM | POA: Diagnosis not present

## 2023-12-11 DIAGNOSIS — E88819 Insulin resistance, unspecified: Secondary | ICD-10-CM

## 2023-12-11 DIAGNOSIS — Z6841 Body Mass Index (BMI) 40.0 and over, adult: Secondary | ICD-10-CM

## 2023-12-11 DIAGNOSIS — E66813 Obesity, class 3: Secondary | ICD-10-CM

## 2023-12-11 DIAGNOSIS — Z91119 Patient's noncompliance with dietary regimen due to unspecified reason: Secondary | ICD-10-CM

## 2023-12-11 MED ORDER — ZEPBOUND 2.5 MG/0.5ML ~~LOC~~ SOAJ
2.5000 mg | SUBCUTANEOUS | 0 refills | Status: DC
  Filled 2023-12-11: qty 2, 28d supply, fill #0

## 2023-12-11 MED ORDER — METFORMIN HCL ER 750 MG PO TB24: 750.0000 mg | ORAL_TABLET | Freq: Every day | ORAL | 0 refills | Status: DC

## 2023-12-11 NOTE — Telephone Encounter (Signed)
 PA submitted through Cover My Meds for Zepbound . Awaiting insurance determination. Key: [id number]

## 2023-12-11 NOTE — Progress Notes (Signed)
 Office: [phone]  /  Fax: [phone]  WEIGHT SUMMARY AND BIOMETRICS  Starting Date: 12/03/21  Starting Weight: 312lb   Weight Lost Since Last Visit: 0lb   Vitals Temp: 98.2 F (36.8 C) BP: 136/79 Pulse Rate: 84 SpO2: 96 %   Body Composition  Body Fat %: 57.1 % Fat Mass (lbs): 173.6 lbs Muscle Mass (lbs): 123.8 lbs Visceral Fat Rating : 22    HPI  Chief Complaint: OBESITY  Stacey Castaneda is here to discuss her progress with her obesity treatment plan. She is on the keeping a food journal and adhering to recommended goals of 1800 calories and 120 protein and states she is following her eating plan approximately 10-20 % of the time. She states she is exercising 30 minutes 1-2 times every other week.  Interval History:  Since last office visit she is down 0 lb This gives her a net weight loss of 8 lb in 2 years of medically supervised weight management She was last seen by Corean Scala FNP last month and Wegovy  was prescribed but denied by insurance She continues to struggle with poor food choices, eating out frequently and overeating She is working long days and has a poor support system at home (husband) She c/o feeling like pills are getting stuck in her throat She has had more problems with seasonal allergies and coughing  Pharmacotherapy: metformin  XR 750 mg daily  PHYSICAL EXAM:  Blood pressure 136/79, pulse 84, temperature 98.2 F (36.8 C), height 5' 4 (1.626 m), weight (!) 304 lb (137.9 kg), SpO2 96%. Body mass index is 52.18 kg/m.  General: She is overweight, cooperative, alert, well developed, and in no acute distress. PSYCH: Has normal mood, affect and thought process.   Lungs: Normal breathing effort, no conversational dyspnea.  ASSESSMENT AND PLAN  TREATMENT PLAN FOR OBESITY:  Recommended Dietary Goals  Stacey Castaneda is currently in the action stage of change. As such, her goal is to continue weight management plan. She has agreed to  keeping a food journal and adhering to recommended goals of 1800 calories and 100 g of  protein and practicing portion control and making smarter food choices, such as increasing vegetables and decreasing simple carbohydrates.  Behavioral Intervention  We discussed the following Behavioral Modification Strategies today: increasing lean protein intake to established goals, increasing fiber rich foods, increasing water intake , work on meal planning and preparation, work on tracking and journaling calories using tracking application, keeping healthy foods at home, identifying sources and decreasing liquid calories, continue to practice mindfulness when eating, planning for success, and continue to work on maintaining a reduced calorie state, getting the recommended amount of protein, incorporating whole foods, making healthy choices, staying well hydrated and practicing mindfulness when eating..  Additional resources provided today: NA  Recommended Physical Activity Goals  Stacey Castaneda has been advised to work up to 150 minutes of moderate intensity aerobic activity a week and strengthening exercises 2-3 times per week for cardiovascular health, weight loss maintenance and preservation of muscle mass.   She has agreed to Think about enjoyable ways to increase daily physical activity and overcoming barriers to exercise and Increase physical activity in their day and reduce sedentary time (increase NEAT). Referral to PREP program made  Pharmacotherapy changes for the treatment of obesity: begin Zepbound  for moderate OSA indication Patient denies a personal or family history of pancreatitis, medullary thyroid  carcinoma or multiple endocrine neoplasia type II. Recommend reviewing pen training video online.  ASSOCIATED CONDITIONS ADDRESSED TODAY  OSA on  CPAP Reviewed PSA from Dr Quita Salt, 01/03/2014 showing an AHI of 24.8. She is compliant with CPAP nightly She has continued to struggle with obesity  for years with c/o fatigue. She is an excellent candidate for Zepbound .  Reviewed website with patient for further information. -     Zepbound ; Inject 2.5 mg into the skin once a week.  Dispense: 2 mL; Refill: 0  Class 3 severe obesity due to excess calories with body mass index (BMI) of 50.0 to 59.9 in adult -     Amb Referral To Provider Referral Exercise Program (P.R.E.P)  Insulin  resistance Lab Results  Component Value Date   NA 137 07/03/2023   K 4.3 07/03/2023   CO2 22 07/03/2023   GLUCOSE 84 07/03/2023   BUN 12 07/03/2023   CREATININE 0.55 (L) 07/03/2023   CALCIUM 9.1 07/03/2023   EGFR 110 07/03/2023   GFRNONAA 92 07/27/2020   Improving fasting insulin  on metformin  XR 750 mg daily Last fasting insulin  19.6 Tolerating metformin  well.  Has room for improvement with diet and exercise Plan to repeat fasting insulin  and BMP in 2 mos and look for further improvements with the addition of Zepbound  + exercise. -     metFORMIN  HCl ER; Take 1 tablet (750 mg total) by mouth daily with breakfast.  Dispense: 90 tablet; Refill: 0  Oropharyngeal dysphagia New.  C/o a pill getting 'stuck' in the R side of her throat causing slight discomfort and the pill went down and she coughed it back up (open capsule).  Denies foods getting stuck, epigastric pain or sore throat.  Denies GERD or heartburn.  Has never had EGD.  Last colonoscopy with Dr Avram 2016.  Has chronic throat clearing with post nasal drip.  She is at risk for an esophageal stricture.  Recommend drinking a full glass of water or lowfat milk with pills, taking one at a time.  Chew food well.  Referral back to GI in the coming months for EGD, sooner if needed.  Nonadherence with dietary restriction Unchanged Poor support and lack of motivation to fully implement changes in the past 2 years Consider ref to RD and/ or weight loss surgery  Seasonal allergies She is using Breo 1 puff daily, Zyrtec 10 mg daily, Flonase  2 spray/ nostril  daily (not consistent) and Ventolin  HFA prn.  She c/o post nasal draining, increased coughing and DOE with stable pulse ox today.  Declined ref to allergist today.  Be sure taking all allergy meds daily as directed Has f/u with PCP   She was informed of the importance of frequent follow up visits to maximize her success with intensive lifestyle modifications for her multiple health conditions.   ATTESTASTION STATEMENTS:  Reviewed by clinician on day of visit: allergies, medications, problem list, medical history, surgical history, family history, social history, and previous encounter notes pertinent to obesity diagnosis.   I have personally spent 48 minutes total time today in preparation, patient care, nutritional counseling and education,  and documentation for this visit, including the following: review of most recent clinical lab tests, prescribing medications/ refilling medications, reviewing medical assistant documentation, review and interpretation of bioimpedence results.     Darice Haddock, D.O. DABFM, DABOM [facility] [address] [place], [place] [address] [phone]

## 2023-12-11 NOTE — Patient Instructions (Signed)
 Resume your BREO daily, ZYRTEC daily and FLONASE  daily Use Albuterol  inhaler for rescue  Continue to work on healthy eating and referral to PREP program made

## 2023-12-12 ENCOUNTER — Other Ambulatory Visit (HOSPITAL_BASED_OUTPATIENT_CLINIC_OR_DEPARTMENT_OTHER): Payer: Self-pay

## 2023-12-15 ENCOUNTER — Encounter (INDEPENDENT_AMBULATORY_CARE_PROVIDER_SITE_OTHER): Payer: Self-pay

## 2023-12-15 ENCOUNTER — Other Ambulatory Visit (HOSPITAL_BASED_OUTPATIENT_CLINIC_OR_DEPARTMENT_OTHER): Payer: Self-pay

## 2023-12-15 NOTE — Telephone Encounter (Signed)
 This medication may be excluded from the patient's benefit.   For more information, please reach out to [facility] directly at [phone].

## 2023-12-15 NOTE — Telephone Encounter (Signed)
This medication may be excluded from the patient's benefit. For more information, please reach out to [facility] directly at [phone].

## 2023-12-16 ENCOUNTER — Other Ambulatory Visit (HOSPITAL_BASED_OUTPATIENT_CLINIC_OR_DEPARTMENT_OTHER): Payer: Self-pay

## 2023-12-17 ENCOUNTER — Other Ambulatory Visit (HOSPITAL_BASED_OUTPATIENT_CLINIC_OR_DEPARTMENT_OTHER): Payer: Self-pay

## 2023-12-17 ENCOUNTER — Telehealth: Payer: Self-pay

## 2023-12-17 NOTE — Telephone Encounter (Signed)
 Left message about PREP Program and asked her to return my call.

## 2023-12-22 ENCOUNTER — Telehealth: Payer: Self-pay

## 2023-12-22 NOTE — Telephone Encounter (Signed)
 Stacey Castaneda's current schedule does not work for the [employer]. If she is able to join later on she will let me know.

## 2024-01-08 ENCOUNTER — Ambulatory Visit: Admitting: Family Medicine

## 2024-01-12 ENCOUNTER — Ambulatory Visit: Admitting: Family Medicine

## 2024-01-12 ENCOUNTER — Encounter: Payer: Self-pay | Admitting: Family Medicine

## 2024-01-12 VITALS — BP 146/79 | HR 82 | Temp 98.7°F | Ht 64.0 in | Wt 307.0 lb

## 2024-01-12 DIAGNOSIS — I10 Essential (primary) hypertension: Secondary | ICD-10-CM

## 2024-01-12 DIAGNOSIS — G4733 Obstructive sleep apnea (adult) (pediatric): Secondary | ICD-10-CM | POA: Diagnosis not present

## 2024-01-12 DIAGNOSIS — E88819 Insulin resistance, unspecified: Secondary | ICD-10-CM

## 2024-01-12 DIAGNOSIS — R1312 Dysphagia, oropharyngeal phase: Secondary | ICD-10-CM

## 2024-01-12 DIAGNOSIS — Z91119 Patient's noncompliance with dietary regimen due to unspecified reason: Secondary | ICD-10-CM

## 2024-01-12 DIAGNOSIS — Z6841 Body Mass Index (BMI) 40.0 and over, adult: Secondary | ICD-10-CM

## 2024-01-12 DIAGNOSIS — E66813 Obesity, class 3: Secondary | ICD-10-CM

## 2024-01-12 MED ORDER — METFORMIN HCL ER 500 MG PO TB24
ORAL_TABLET | ORAL | Status: DC
Start: 2024-01-12 — End: 2024-03-29

## 2024-01-12 NOTE — Progress Notes (Signed)
 Office: [phone]  /  Fax: [phone]  WEIGHT SUMMARY AND BIOMETRICS  Starting Date: 12/03/21  Starting Weight: 312lb   Weight Lost Since Last Visit: 0lb   Vitals Temp: 98.7 F (37.1 C) BP: (!) 146/79 Pulse Rate: 82 SpO2: 97 %   Body Composition  Body Fat %: 56.7 % Fat Mass (lbs): 174.4 lbs Muscle Mass (lbs): 126.4 lbs Visceral Fat Rating : 22     HPI  Chief Complaint: OBESITY  Stacey Castaneda is here to discuss her progress with her obesity treatment plan. She is on the keeping a food journal and adhering to recommended goals of 1800 calories and 120 protein and states she is following her eating plan approximately 10-20 % of the time. She states she is exercising 0 minutes 0 times per week.   Interval History:  Since last office visit she is up 3 lb She had declined ref to GI (waiting for when colonoscopy is due) She was unable to do the PREP program due to her work schedule She is breathing better with her allergies She is compliant with CPAP She is tolerating metformin  XR 750 mg daily She is getting ~90 g of protein daily She has been eating more bagels lately She did celebrate her anniversary She is eating more fruit when she craves sugar She has a net weight loss of 5 lb in 2 years She has added in more protein shakes She plans to walk more outside her house and walk at the park.  She will get new sneakers She did complete bariatric surgery seminar with [facility] but isn't ready to make an appt  Pharmacotherapy: metformin  XR 750 mg daily  PHYSICAL EXAM:  Blood pressure (!) 146/79, pulse 82, temperature 98.7 F (37.1 C), height 5' 4 (1.626 m), weight (!) 307 lb (139.3 kg), SpO2 97%. Body mass index is 52.7 kg/m.  General: She is overweight, cooperative, alert, well developed, and in no acute distress. PSYCH: Has normal mood, affect and thought process.   Lungs: Normal breathing effort, no conversational dyspnea.   ASSESSMENT AND PLAN  TREATMENT PLAN  FOR OBESITY:  Recommended Dietary Goals  Stacey Castaneda is currently in the action stage of change. As such, her goal is to continue weight management plan. She has agreed to keeping a food journal and adhering to recommended goals of 1800 calories and 90-120 g of  protein and practicing portion control and making smarter food choices, such as increasing vegetables and decreasing simple carbohydrates.  Behavioral Intervention  We discussed the following Behavioral Modification Strategies today: increasing lean protein intake to established goals, increasing fiber rich foods, increasing water intake , work on meal planning and preparation, work on tracking and journaling calories using tracking application, reading food labels , keeping healthy foods at home, avoiding temptations and identifying enticing environmental cues, continue to work on maintaining a reduced calorie state, getting the recommended amount of protein, incorporating whole foods, making healthy choices, staying well hydrated and practicing mindfulness when eating., and rethink your why .  Additional resources provided today: NA  Recommended Physical Activity Goals  Stacey Castaneda has been advised to work up to 150 minutes of moderate intensity aerobic activity a week and strengthening exercises 2-3 times per week for cardiovascular health, weight loss maintenance and preservation of muscle mass.   She has agreed to Think about enjoyable ways to increase daily physical activity and overcoming barriers to exercise and Increase physical activity in their day and reduce sedentary time (increase NEAT). Increase daily walking time  to 20 min/ day  Pharmacotherapy changes for the treatment of obesity: increase Metformin  XR to 1000 mg daily, move to dinner time  ASSOCIATED CONDITIONS ADDRESSED TODAY  OSA on CPAP Reports excellent compliance using CPAP at night Unfortunately, her insurance has denied coverage for Zepbound  even with her FDA  indication of moederate to severe sleep apnea  Class 3 severe obesity due to excess calories with body mass index (BMI) of 50.0 to 59.9 in adult Patient has a net weight loss of 5 lb in 2 years She has struggled with compliance with diet and exercise and medical options are greatly limited due to her insurance coverage She will consider weight loss surgery if failing to progress over the next few months (already did seminar)  Oropharyngeal dysphagia Denied need for GI referral though several times has had a pill 'get stuck' in her throat.  She is chewing food better and denies regurgitation of undigested food  She plans to return to see GI in 2026 for colonoscopy and I would recommend EGD at the same time  Insulin  resistance Improving She has seen improving fasting insulin  levels though still elevated She has fair compliance but does continue to struggle with sugar intake She is tolerating metformin  XR 750 mg fairly well  Continue lifestyle changes/ active plan for weight loss/ increase walking time Increase Metformin  XR to 1000 mg daily - take with dinner to help ease GI upset -- was taking with a protein shake alone  Nonadherence with dietary restriction Unchanged  Essential hypertension BP is elevated today Taking olmesartan - amlodopine- hydrochlorothiazide  40-10-25 mg daily Follow up with PCP for further management Avoid use of stimulant AOMs given her HTN     She was informed of the importance of frequent follow up visits to maximize her success with intensive lifestyle modifications for her multiple health conditions.   ATTESTASTION STATEMENTS:  Reviewed by clinician on day of visit: allergies, medications, problem list, medical history, surgical history, family history, social history, and previous encounter notes pertinent to obesity diagnosis.   I have personally spent 30 minutes total time today in preparation, patient care, nutritional counseling and education,  and  documentation for this visit, including the following: review of most recent clinical lab tests, prescribing medications/ refilling medications, reviewing medical assistant documentation, review and interpretation of bioimpedence results.     Stacey Castaneda, D.O. DABFM, DABOM [facility] [address] [place], [place] [address] [phone]

## 2024-02-22 ENCOUNTER — Other Ambulatory Visit: Payer: Self-pay | Admitting: Family Medicine

## 2024-02-22 DIAGNOSIS — F418 Other specified anxiety disorders: Secondary | ICD-10-CM

## 2024-02-24 ENCOUNTER — Ambulatory Visit: Admitting: Family Medicine

## 2024-02-24 ENCOUNTER — Encounter: Payer: Self-pay | Admitting: Family Medicine

## 2024-02-24 VITALS — BP 137/83 | HR 88 | Temp 98.8°F | Ht 64.0 in | Wt 307.0 lb

## 2024-02-24 DIAGNOSIS — Z6841 Body Mass Index (BMI) 40.0 and over, adult: Secondary | ICD-10-CM

## 2024-02-24 DIAGNOSIS — Z91119 Patient's noncompliance with dietary regimen due to unspecified reason: Secondary | ICD-10-CM | POA: Diagnosis not present

## 2024-02-24 DIAGNOSIS — F418 Other specified anxiety disorders: Secondary | ICD-10-CM

## 2024-02-24 DIAGNOSIS — E66813 Obesity, class 3: Secondary | ICD-10-CM

## 2024-02-24 DIAGNOSIS — E88819 Insulin resistance, unspecified: Secondary | ICD-10-CM | POA: Diagnosis not present

## 2024-02-24 NOTE — Patient Instructions (Signed)
 Begin LIVER SHRINKING DIET:  Protein shake (20-30 g of protein, < 7 g of sugar)--- 3 per day  One frozen meal: 300-450 calories and >20 g of protein  You can have: Sugar free jello Bone broth or chicken broth Zero sugar drinks Cucumber slices, celery, vinegar  2 weeks of Liver Shrinking Diet

## 2024-02-24 NOTE — Progress Notes (Signed)
 Office: [phone]  /  Fax: [phone]  WEIGHT SUMMARY AND BIOMETRICS  Starting Date: 12/03/21  Starting Weight: 312lb   Weight Lost Since Last Visit: 0lb   Vitals Temp: 98.8 F (37.1 C) BP: 137/83 Pulse Rate: 88 SpO2: 96 %   Body Composition  Body Fat %: 57.2 % Fat Mass (lbs): 176 lbs Muscle Mass (lbs): 125 lbs Visceral Fat Rating : 22   HPI  Chief Complaint: OBESITY  Stacey Castaneda is here to discuss her progress with her obesity treatment plan. She is on the keeping a food journal and adhering to recommended goals of 1800 calories and 180 protein and states she is following her eating plan approximately 10 % of the time. She states she is exercising 0 minutes 0 times per week.  Interval History:  Since last office visit she is down 0 lb She has been cooking more at home She has been doing a bit of stress eating with the chance that she may need to find another job This gives her net weight loss of 5 pounds and 2+ years of medically supervised weight management She did go up on metformin  XR to 1000 mg once daily and is tolerating this well taking it with food She is not tracking her calorie intake She admits to continued poor food choices, overeating and eating out frequently. She does no regular exercise She is still considering weight loss surgery as her long-term solution  Pharmacotherapy: Metformin  XR 1000 mg once daily with food  PHYSICAL EXAM:  Blood pressure 137/83, pulse 88, temperature 98.8 F (37.1 C), height 5' 4 (1.626 m), weight (!) 307 lb (139.3 kg), SpO2 96%. Body mass index is 52.7 kg/m.  General: She is overweight, cooperative, alert, well developed, and in no acute distress. PSYCH: Has normal mood, affect and thought process.   Lungs: Normal breathing effort, no conversational dyspnea.   ASSESSMENT AND PLAN  TREATMENT PLAN FOR OBESITY:  Recommended Dietary Goals  Stacey Castaneda is currently in the action stage of change. As such, her goal  is to continue weight management plan. She has agreed to the Very Low Calorie Protein Sparing Modified Fast Plan. Begin 2 weeks of liver shrinking diet with 3 protein shakes per day, one 300 to 400-calorie frozen meal and at 0-calorie drinks.  Plan of care reviewed on after visit summary  Behavioral Intervention  We discussed the following Behavioral Modification Strategies today: increasing lean protein intake to established goals, increasing water intake , keeping healthy foods at home, identifying sources and decreasing liquid calories, and planning for success.  Additional resources provided today: NA  Recommended Physical Activity Goals  Stacey Castaneda has been advised to work up to 150 minutes of moderate intensity aerobic activity a week and strengthening exercises 2-3 times per week for cardiovascular health, weight loss maintenance and preservation of muscle mass.   She has agreed to Think about enjoyable ways to increase daily physical activity and overcoming barriers to exercise and Increase physical activity in their day and reduce sedentary time (increase NEAT). Work on increasing walking time, chair exercises  Pharmacotherapy changes for the treatment of obesity: None  ASSOCIATED CONDITIONS ADDRESSED TODAY  Nonadherence with dietary restriction Unchanged.  Patient remains in the contemplative phase of behavior change for dietary change.  Continued stress seems to be a consistent with barriers to further progress.  She has declined counseling or dietitian visit.  Will begin liver shrinking diet for 2 weeks.  Reassess progress in 2 to 3 weeks.  Class 3  severe obesity due to excess calories with serious comorbidity and body mass index (BMI) of 50.0 to 59.9 in adult (HCC) Unchanged.  Patient has failed to see adequate weight loss and 2+ years of medically supervised weight management.  She has attended surgical seminar and is contemplating bariatric surgery as a more long-term  solution.  She is waiting to see if her job changes in the next 2 months as her insurance would also change.  She has no insurance coverage for antiobesity medication limiting use of GLP-1's.  Insulin  resistance Plan to recheck fasting insulin  level next visit.  Her fasting insulin  was last 19.6 on 2/27, down from 1 year ago at 30.4.  She is tolerating metformin  XR, increased in dose to 1000 mg once daily with food without problem.  Reassess BMP next visit as well.  Depression with anxiety Unchanged.  She is taking bupropion  XL 150 mg once daily with her PCP along with sertraline  100 milligram daily.  Her husband is fairly supportive.  She is still partaking in stress eating behaviors.     She was informed of the importance of frequent follow up visits to maximize her success with intensive lifestyle modifications for her multiple health conditions.   ATTESTASTION STATEMENTS:  Reviewed by clinician on day of visit: allergies, medications, problem list, medical history, surgical history, family history, social history, and previous encounter notes pertinent to obesity diagnosis.   I have personally spent 30 minutes total time today in preparation, patient care, nutritional counseling and education,  and documentation for this visit, including the following: review of most recent clinical lab tests, prescribing medications/ refilling medications, reviewing medical assistant documentation, review and interpretation of bioimpedence results.     Darice Haddock, D.O. DABFM, DABOM [facility] [address] [place], [place] [address] [phone]

## 2024-03-16 ENCOUNTER — Ambulatory Visit: Admitting: Family Medicine

## 2024-03-16 VITALS — Ht 64.0 in | Wt 304.0 lb

## 2024-03-16 DIAGNOSIS — E559 Vitamin D deficiency, unspecified: Secondary | ICD-10-CM

## 2024-03-16 DIAGNOSIS — E88819 Insulin resistance, unspecified: Secondary | ICD-10-CM

## 2024-03-16 DIAGNOSIS — I10 Essential (primary) hypertension: Secondary | ICD-10-CM

## 2024-03-16 DIAGNOSIS — E66813 Obesity, class 3: Secondary | ICD-10-CM

## 2024-03-16 DIAGNOSIS — Z6841 Body Mass Index (BMI) 40.0 and over, adult: Secondary | ICD-10-CM

## 2024-03-16 NOTE — Progress Notes (Signed)
 Patient missed her appointment. Patient was weighed today and we will get labs. Patient has a follow up with Dr. Waylan in 2 weeks.

## 2024-03-17 ENCOUNTER — Ambulatory Visit: Payer: Self-pay | Admitting: Family Medicine

## 2024-03-17 LAB — VITAMIN D 25 HYDROXY (VIT D DEFICIENCY, FRACTURES): Vit D, 25-Hydroxy: 35.4 ng/mL (ref 30.0–100.0)

## 2024-03-17 LAB — COMPREHENSIVE METABOLIC PANEL WITH GFR
ALT: 20 IU/L (ref 0–32)
AST: 18 IU/L (ref 0–40)
Albumin: 4.1 g/dL (ref 3.8–4.9)
Alkaline Phosphatase: 73 IU/L (ref 49–135)
BUN/Creatinine Ratio: 17 (ref 9–23)
BUN: 12 mg/dL (ref 6–24)
Bilirubin Total: 0.3 mg/dL (ref 0.0–1.2)
CO2: 21 mmol/L (ref 20–29)
Calcium: 8.9 mg/dL (ref 8.7–10.2)
Chloride: 101 mmol/L (ref 96–106)
Creatinine, Ser: 0.71 mg/dL (ref 0.57–1.00)
Globulin, Total: 2.8 g/dL (ref 1.5–4.5)
Glucose: 101 mg/dL — ABNORMAL HIGH (ref 70–99)
Potassium: 4.3 mmol/L (ref 3.5–5.2)
Sodium: 134 mmol/L (ref 134–144)
Total Protein: 6.9 g/dL (ref 6.0–8.5)
eGFR: 102 mL/min/1.73 (ref >59)

## 2024-03-17 LAB — VITAMIN B12: Vitamin B-12: 572 pg/mL (ref 232–1245)

## 2024-03-17 LAB — INSULIN, RANDOM: INSULIN: 26.3 u[IU]/mL — ABNORMAL HIGH (ref 2.6–24.9)

## 2024-03-18 ENCOUNTER — Ambulatory Visit: Admitting: Family Medicine

## 2024-03-24 ENCOUNTER — Ambulatory Visit: Admitting: Family Medicine

## 2024-03-29 ENCOUNTER — Ambulatory Visit: Admitting: Family Medicine

## 2024-03-29 ENCOUNTER — Encounter: Payer: Self-pay | Admitting: Family Medicine

## 2024-03-29 VITALS — BP 130/82 | HR 77 | Temp 99.0°F | Ht 64.0 in | Wt 306.0 lb

## 2024-03-29 DIAGNOSIS — E88819 Insulin resistance, unspecified: Secondary | ICD-10-CM

## 2024-03-29 DIAGNOSIS — Z91119 Patient's noncompliance with dietary regimen due to unspecified reason: Secondary | ICD-10-CM

## 2024-03-29 DIAGNOSIS — Z6841 Body Mass Index (BMI) 40.0 and over, adult: Secondary | ICD-10-CM

## 2024-03-29 DIAGNOSIS — E559 Vitamin D deficiency, unspecified: Secondary | ICD-10-CM

## 2024-03-29 MED ORDER — VITAMIN D3 125 MCG (5000 UT) PO CHEW: 5000.0000 [IU] | CHEWABLE_TABLET | Freq: Every day | ORAL | Status: AC

## 2024-03-29 MED ORDER — METFORMIN HCL 1000 MG PO TABS: 1500.0000 mg | ORAL_TABLET | Freq: Every day | ORAL | 0 refills | Status: DC

## 2024-03-29 NOTE — Progress Notes (Signed)
 Office: [phone]  /  Fax: [phone]  WEIGHT SUMMARY AND BIOMETRICS  Starting Date: 12/03/21  Starting Weight: 312lb   Weight Lost Since Last Visit: 0lb   Vitals Temp: 99 F (37.2 C) BP: 130/82 Pulse Rate: 77 SpO2: 99 %   Body Composition  Body Fat %: 58.1 % Fat Mass (lbs): 178 lbs Muscle Mass (lbs): 122 lbs Visceral Fat Rating : 23    HPI  Chief Complaint: OBESITY  Sarinah is here to discuss her progress with her obesity treatment plan. She is on the keeping a food journal and adhering to recommended goals of 1800 calories and 180 protein and states she is following her eating plan approximately 10-20 % of the time. She states she is exercising 0 minutes 0 times per week.  Interval History:  Since last office visit she is up 2 lb She did try out liver shrinking diet for about 4 days but was unable to stay on it She lacks regular exercise and tends to crave more salty foods Her husband has a hard time eating healthy with her She has been wearing her CPAP at night She is net down 3 lb in the past 2+ years of medically supervised weight management She plans to be more active at home this week She would like to improve endurance with walking She may be changing jobs in the early new year  Pharmacotherapy: Metformin  1000 mg XR once daily with food  PHYSICAL EXAM:  Blood pressure 130/82, pulse 77, temperature 99 F (37.2 C), height 5' 4 (1.626 m), weight (!) 306 lb (138.8 kg), SpO2 99%. Body mass index is 52.52 kg/m.  General: She is overweight, cooperative, alert, well developed, and in no acute distress. PSYCH: Has normal mood, affect and thought process.   Lungs: Normal breathing effort, no conversational dyspnea.   ASSESSMENT AND PLAN  TREATMENT PLAN FOR OBESITY:  Recommended Dietary Goals  Udell is currently in the action stage of change. As such, her goal is to continue weight management plan. She has agreed to practicing portion control  and making smarter food choices, such as increasing vegetables and decreasing simple carbohydrates.  Behavioral Intervention  We discussed the following Behavioral Modification Strategies today: increasing lean protein intake to established goals, increasing fiber rich foods, increasing water intake , work on meal planning and preparation, keeping healthy foods at home, and planning for success.  Additional resources provided today: NA  Recommended Physical Activity Goals  Nekita has been advised to work up to 150 minutes of moderate intensity aerobic activity a week and strengthening exercises 2-3 times per week for cardiovascular health, weight loss maintenance and preservation of muscle mass.   She has agreed to Think about enjoyable ways to increase daily physical activity and overcoming barriers to exercise and Increase physical activity in their day and reduce sedentary time (increase NEAT).  Pharmacotherapy changes for the treatment of obesity: None  ASSOCIATED CONDITIONS ADDRESSED TODAY  Insulin  resistance Reviewed labs from last visit.  She still has findings of insulin  resistance and is tolerating metformin  well.  Though her chart reflected 1000 mg XR once daily of metformin , she reports taking 1500 mg once daily without adverse side effect.  Chemistry panel reviewed and up-to-date.  Will continue 1500 mg of metformin  once daily.  She has room for improvement with compliance with diet and exercise changes.  -     metFORMIN  HCl; Take 1.5 tablets (1,500 mg total) by mouth daily with breakfast.  Dispense: 135 tablet; Refill:  0  Morbid obesity (HCC) Reviewed patient's overall progress.  She has lost 3 pounds and 2+ years of medically supervised weight management with a BMI over 50. Her insurance has failed to cover use of a GLP-1 receptor agonist She lacks a good support system at home She has room for improvement with dietary tracking, healthy food choices and regular exercise We  have discussed the role of bariatric surgery will consider in the new year.  BMI 50.0-59.9, adult (HCC)  Vitamin D  insufficiency Reviewed lab findings with patient.  Her vitamin D  level is suboptimal less than 50.  Reminded her to take over-the-counter vitamin D  at 5000 IU once daily.  She had been taking vitamin D  50,000 IU every other week.  Nonadherence with dietary restriction Unchanged She remains in the contemplative phase of behavior change  Other orders -     Vitamin D3; Chew 5,000 Units by mouth daily.      She was informed of the importance of frequent follow up visits to maximize her success with intensive lifestyle modifications for her multiple health conditions.   ATTESTASTION STATEMENTS:  Reviewed by clinician on day of visit: allergies, medications, problem list, medical history, surgical history, family history, social history, and previous encounter notes pertinent to obesity diagnosis.   I have personally spent 30 minutes total time today in preparation, patient care, nutritional counseling and education,  and documentation for this visit, including the following: review of most recent clinical lab tests, prescribing medications/ refilling medications, reviewing medical assistant documentation, review and interpretation of bioimpedence results.     Darice Haddock, D.O. DABFM, DABOM [facility] [address] [place], [place] [address] [phone]

## 2024-04-14 ENCOUNTER — Ambulatory Visit

## 2024-04-14 ENCOUNTER — Other Ambulatory Visit (HOSPITAL_BASED_OUTPATIENT_CLINIC_OR_DEPARTMENT_OTHER): Payer: Self-pay

## 2024-04-14 ENCOUNTER — Ambulatory Visit: Admitting: Family Medicine

## 2024-04-14 ENCOUNTER — Other Ambulatory Visit: Payer: Self-pay

## 2024-04-14 VITALS — BP 126/78 | HR 88 | Ht 64.0 in | Wt 306.0 lb

## 2024-04-14 DIAGNOSIS — E042 Nontoxic multinodular goiter: Secondary | ICD-10-CM | POA: Diagnosis not present

## 2024-04-14 DIAGNOSIS — F418 Other specified anxiety disorders: Secondary | ICD-10-CM

## 2024-04-14 DIAGNOSIS — Z23 Encounter for immunization: Secondary | ICD-10-CM | POA: Diagnosis not present

## 2024-04-14 DIAGNOSIS — R053 Chronic cough: Secondary | ICD-10-CM

## 2024-04-14 DIAGNOSIS — E88819 Insulin resistance, unspecified: Secondary | ICD-10-CM

## 2024-04-14 DIAGNOSIS — G4733 Obstructive sleep apnea (adult) (pediatric): Secondary | ICD-10-CM

## 2024-04-14 DIAGNOSIS — J4540 Moderate persistent asthma, uncomplicated: Secondary | ICD-10-CM | POA: Diagnosis not present

## 2024-04-14 DIAGNOSIS — I10 Essential (primary) hypertension: Secondary | ICD-10-CM

## 2024-04-14 MED ORDER — FLUTICASONE FUROATE-VILANTEROL 100-25 MCG/ACT IN AEPB: 1.0000 | INHALATION_SPRAY | Freq: Every day | RESPIRATORY_TRACT | 3 refills | Status: AC

## 2024-04-14 MED ORDER — BUPROPION HCL ER (XL) 150 MG PO TB24: 150.0000 mg | ORAL_TABLET | Freq: Every day | ORAL | 3 refills | Status: AC

## 2024-04-14 MED ORDER — ALBUTEROL SULFATE HFA 108 (90 BASE) MCG/ACT IN AERS: 2.0000 | INHALATION_SPRAY | RESPIRATORY_TRACT | 4 refills | Status: AC | PRN

## 2024-04-14 MED ORDER — SERTRALINE HCL 100 MG PO TABS: 100.0000 mg | ORAL_TABLET | Freq: Every day | ORAL | 3 refills | Status: AC

## 2024-04-14 MED ORDER — HYDROXYZINE PAMOATE 25 MG PO CAPS: 25.0000 mg | ORAL_CAPSULE | Freq: Every evening | ORAL | 3 refills | Status: AC | PRN

## 2024-04-14 MED ORDER — ALPRAZOLAM 0.25 MG PO TABS
0.2500 mg | ORAL_TABLET | Freq: Every day | ORAL | 0 refills | Status: AC | PRN
  Filled 2024-04-14: qty 15, 15d supply, fill #0

## 2024-04-14 MED ORDER — SPIRONOLACTONE 25 MG PO TABS: 25.0000 mg | ORAL_TABLET | Freq: Every day | ORAL | 3 refills | Status: AC

## 2024-04-14 MED ORDER — OLMESARTAN-AMLODIPINE-HCTZ 40-10-25 MG PO TABS: 1.0000 | ORAL_TABLET | Freq: Every day | ORAL | 3 refills | Status: AC

## 2024-04-14 NOTE — Assessment & Plan Note (Signed)
 Thyroid  nodule Perceived growth of thyroid  nodule. Previous ultrasounds performed. - Ordered thyroid  ultrasound to assess nodule growth.

## 2024-04-14 NOTE — Assessment & Plan Note (Addendum)
 Depression with anxiety Increased stress due to job situation. Previous prescription for stress management expired. - Prescribed Xanax  for stress and panic attacks, 15 pills to last a year. - continue Wellbutrin 

## 2024-04-14 NOTE — Progress Notes (Signed)
 Established Patient Office Visit  Patient ID: Stacey Castaneda, female    DOB: [birth year]  Age: 53 y.o. MRN: [id number] PCP: Alvan Dorothyann BIRCH, MD  Chief Complaint  Patient presents with   Hypertension    Subjective:     HPI  Discussed the use of AI scribe software for clinical note transcription with the patient, who gave verbal consent to proceed.  History of Present Illness Stacey Castaneda is a 53 year old female who presents with chronic cough and stress management concerns.  Chronic productive cough - Persistent cough producing phlegm, duration unspecified - Unresponsive to medication - No associated symptoms of feeling ill - Persistent fatigue - History of smoking - Concerned about possible underlying malignancy - No chest x-ray in recent years - Previous CT scans and sputum cultures performed  Psychological stress and anxiety - Increased stress due to impending job loss as contract ends at the end of the year - Anticipates unemployment - History of panic attacks - Previously used Xanax  for stress management; prescription expired - Requests refill of Xanax  - Has been hoarding expired medication  Bone health concerns - Concerned about bone health due to multiple prior courses of steroids - Considering bone density testing for baseline assessment - No significant current symptoms except occasional arthritic twinges  Dietary habits and concerns - Prefers Japanese food and soybean products - Inquires about potential health concerns related to these foods - Aware of general dietary guidelines regarding fish consumption and mercury exposure     ROS    Objective:     BP 126/78   Pulse 88   Ht 5' 4 (1.626 m)   Wt (!) 306 lb (138.8 kg)   SpO2 97%   BMI 52.52 kg/m    Physical Exam Vitals and nursing note reviewed.  Constitutional:      Appearance: Normal appearance.  HENT:     Head: Normocephalic and atraumatic.  Eyes:      Conjunctiva/sclera: Conjunctivae normal.  Cardiovascular:     Rate and Rhythm: Normal rate and regular rhythm.  Pulmonary:     Effort: Pulmonary effort is normal.     Breath sounds: Normal breath sounds.  Skin:    General: Skin is warm and dry.  Neurological:     Mental Status: She is alert.  Psychiatric:        Mood and Affect: Mood normal.      No results found for any visits on 04/14/24.    The 10-year ASCVD risk score (Arnett DK, et al., 2019) is: 2.1%    Assessment & Plan:   Problem List Items Addressed This Visit       Cardiovascular and Mediastinum   Essential hypertension - Primary   BP at goal today!!!!      Relevant Medications   Olmesartan -amLODIPine -HCTZ 40-10-25 MG TABS   spironolactone  (ALDACTONE ) 25 MG tablet     Respiratory   OSA on CPAP   Moderate persistent asthma   Moderate persistent asthma Chronic cough with phlegm persists despite medication. Differential includes potential cancer screening due to smoking history. - Ordered chest x-ray to evaluate persistent cough and phlegm. - Consider CT scan if chest x-ray is inconclusive. - Discussed potential cancer screening due to smoking history.      Relevant Medications   albuterol  (VENTOLIN  HFA) 108 (90 Base) MCG/ACT inhaler   fluticasone  furoate-vilanterol (BREO ELLIPTA ) 100-25 MCG/ACT AEPB     Endocrine   Multinodular non-toxic goiter   Thyroid  nodule Perceived growth of thyroid   nodule. Previous ultrasounds performed. - Ordered thyroid  ultrasound to assess nodule growth.      Relevant Orders   US  THYROID    Insulin  resistance   Lab Results  Component Value Date   HGBA1C 5.3 09/12/2023   A1C looks great!!! On metformin         Other   Depression with anxiety   Depression with anxiety Increased stress due to job situation. Previous prescription for stress management expired. - Prescribed Xanax  for stress and panic attacks, 15 pills to last a year. - continue Wellbutrin        Relevant Medications   buPROPion  (WELLBUTRIN  XL) 150 MG 24 hr tablet   hydrOXYzine  (VISTARIL ) 25 MG capsule   sertraline  (ZOLOFT ) 100 MG tablet   ALPRAZolam  (XANAX ) 0.25 MG tablet   Other Visit Diagnoses       Encounter for immunization       Relevant Orders   Flu vaccine trivalent PF, 6mos and older(Flulaval,Afluria,Fluarix,Fluzone) (Completed)     Chronic cough       Relevant Orders   DG Chest 2 View       Assessment and Plan Assessment & Plan  General health maintenance Discussion of cervical cancer screening and Pap smear status. Discussion of osteopenia risk due to family history and steroid use. - Ensure cervical cancer screening is up to date. - Plan to Order bone density test due to family history of osteopenia and steroid use.in the future.  - Discussed soy consumption and its effects on health.    Return in about 6 months (around 10/13/2024) for Hypertension.    Dorothyann Byars, MD [facility] at [facility]

## 2024-04-14 NOTE — Assessment & Plan Note (Signed)
 BP at goal today.

## 2024-04-14 NOTE — Assessment & Plan Note (Addendum)
 Lab Results  Component Value Date   HGBA1C 5.3 09/12/2023   A1C looks great!!! On metformin 

## 2024-04-14 NOTE — Assessment & Plan Note (Signed)
 Moderate persistent asthma Chronic cough with phlegm persists despite medication. Differential includes potential cancer screening due to smoking history. - Ordered chest x-ray to evaluate persistent cough and phlegm. - Consider CT scan if chest x-ray is inconclusive. - Discussed potential cancer screening due to smoking history.

## 2024-04-19 ENCOUNTER — Ambulatory Visit: Payer: Self-pay | Admitting: Family Medicine

## 2024-04-19 NOTE — Progress Notes (Signed)
 Hi Arnette, ultrasound of the thyroid  shows multinodular thyroid  gland.  The most dominant or largest nodule is stable but dating back to 2018 which is awesome so no major changes.  The 1 that they labeled nodule #1 on the right upper side of the gland they recommend following up in 1 year no biopsy needed at this point.

## 2024-04-20 NOTE — Progress Notes (Signed)
 Hi Stacey Castaneda, good news chest x-ray looks good no concerning findings.  Do you feel like it is triggering from your asthma?  Are you using your Ranell pretty regularly?

## 2024-04-22 ENCOUNTER — Other Ambulatory Visit (HOSPITAL_BASED_OUTPATIENT_CLINIC_OR_DEPARTMENT_OTHER): Payer: Self-pay

## 2024-04-22 ENCOUNTER — Other Ambulatory Visit: Payer: Self-pay | Admitting: Family Medicine

## 2024-04-22 MED ORDER — PREDNISONE 20 MG PO TABS
40.0000 mg | ORAL_TABLET | Freq: Every day | ORAL | 0 refills | Status: AC
  Filled 2024-04-22: qty 10, 5d supply, fill #0

## 2024-04-22 MED ORDER — PREDNISONE 20 MG PO TABS: 40.0000 mg | ORAL_TABLET | Freq: Every day | ORAL | 0 refills | Status: DC

## 2024-04-22 NOTE — Progress Notes (Signed)
 Ok sending over prednisone  for asthma flare.

## 2024-04-22 NOTE — Progress Notes (Signed)
Meds ordered this encounter  Medications   predniSONE (DELTASONE) 20 MG tablet    Sig: Take 2 tablets (40 mg total) by mouth daily with breakfast.    Dispense:  10 tablet    Refill:  0

## 2024-04-22 NOTE — Telephone Encounter (Signed)
 FYI-  Patient was requesting that we change the pharmacy that prednisone  was sent to from [facility] to [facility].  I have done this and patient was informed.

## 2024-05-26 ENCOUNTER — Ambulatory Visit: Admitting: Family Medicine

## 2024-05-26 ENCOUNTER — Encounter: Payer: Self-pay | Admitting: Family Medicine

## 2024-05-26 VITALS — BP 155/83 | HR 99 | Temp 98.0°F | Ht 64.0 in | Wt 310.0 lb

## 2024-05-26 DIAGNOSIS — F418 Other specified anxiety disorders: Secondary | ICD-10-CM

## 2024-05-26 DIAGNOSIS — I10 Essential (primary) hypertension: Secondary | ICD-10-CM

## 2024-05-26 DIAGNOSIS — Z6841 Body Mass Index (BMI) 40.0 and over, adult: Secondary | ICD-10-CM

## 2024-05-26 DIAGNOSIS — E88819 Insulin resistance, unspecified: Secondary | ICD-10-CM

## 2024-05-26 MED ORDER — METFORMIN HCL 1000 MG PO TABS: 1000.0000 mg | ORAL_TABLET | Freq: Every day | ORAL | 0 refills | Status: AC

## 2024-05-26 NOTE — Patient Instructions (Signed)
 PREP program + exercise on your own -- do what you can!  Resume cat 4 meal plan  Keep me posted  Plan for visit with [facility] to discuss next steps for weight loss surgery

## 2024-05-26 NOTE — Progress Notes (Signed)
 "  Office: [phone]  /  Fax: [phone]  WEIGHT SUMMARY AND BIOMETRICS  Starting Date: 12/03/21  Starting Weight: 312lb   Weight Lost Since Last Visit: 0lb   Vitals Temp: 98 F (36.7 C) BP: (!) 155/83 Pulse Rate: 99 SpO2: 97 %   Body Composition  Body Fat %: 57.9 % Fat Mass (lbs): 179.4 lbs Muscle Mass (lbs): 124 lbs Visceral Fat Rating : 23    HPI  Chief Complaint: OBESITY  Stacey Castaneda is here to discuss her progress with her obesity treatment plan. She is on the practicing portion control and making smarter food choices, such as increasing vegetables and decreasing simple carbohydrates and states she is following her eating plan approximately 10 % of the time. She states she is exercising 0 minutes 0 times per week.  Interval History:  Since last office visit she is up 4 lb She was on a round of steroid since her last visit She had a change of jobs which caused some emotional eating She did indulge more over the holidays She is net up 1 lb in 2.5 years of medically supervised weight management She is considering the role of bariatric surgery, awaiting word on a future job/ health insurance She has more time right now to go to the gym and meal prep  Pharmacotherapy: metformin  XR 1000 mg daily  PHYSICAL EXAM:  Blood pressure (!) 155/83, pulse 99, temperature 98 F (36.7 C), height 5' 4 (1.626 m), weight (!) 310 lb (140.6 kg), SpO2 97%. Body mass index is 53.21 kg/m.  General: She is overweight, cooperative, alert, well developed, and in no acute distress. PSYCH: Has normal mood, affect and thought process.   Lungs: Normal breathing effort, no conversational dyspnea.  ASSESSMENT AND PLAN  TREATMENT PLAN FOR OBESITY:  Recommended Dietary Goals  Stacey Castaneda is currently in the action stage of change. As such, her goal is to continue weight management plan. She has agreed to the Category 4 Plan. Copy given today  Behavioral Intervention  We discussed  the following Behavioral Modification Strategies today: increasing lean protein intake to established goals, increasing fiber rich foods, increasing water intake , work on meal planning and preparation, keeping healthy foods at home, continue to practice mindfulness when eating, planning for success, and avoid all-or-none thinking.  Additional resources provided today: NA  Recommended Physical Activity Goals  Stacey Castaneda has been advised to work up to 150 minutes of moderate intensity aerobic activity a week and strengthening exercises 2-3 times per week for cardiovascular health, weight loss maintenance and preservation of muscle mass.   She has agreed to Think about enjoyable ways to increase daily physical activity and overcoming barriers to exercise and Increase physical activity in their day and reduce sedentary time (increase NEAT). Ref to prep program done  Pharmacotherapy changes for the treatment of obesity: none  ASSOCIATED CONDITIONS ADDRESSED TODAY  Insulin  resistance Last fasting insulin  high at 26.3 Tolerating metformin  XR which she as taking 1000 mg daily. Continue current meds -     metFORMIN  HCl; Take 1 tablet (1,000 mg total) by mouth daily with breakfast.  Dispense: 90 tablet; Refill: 0  Morbid obesity (HCC) -     Amb Referral To Provider Referral Exercise Program (P.R.E.P) Plan for visit at [facility] to take the next steps towards bariatric surgery  BMI 50.0-59.9, adult (HCC)  Essential hypertension BP elevated today Continue olmesartan / amlodopine/ hydrochlorothiazide  40/10/25 mg daily (did not take today)   Depression with anxiety Worsened by recent loss of job  Husband has been supportive Continue sertraline  100 mg daily per PCP Work on self care, sleep at night and mindful eating      She was informed of the importance of frequent follow up visits to maximize her success with intensive lifestyle modifications for her multiple health conditions.   ATTESTASTION  STATEMENTS:  Reviewed by clinician on day of visit: allergies, medications, problem list, medical history, surgical history, family history, social history, and previous encounter notes pertinent to obesity diagnosis.   I have personally spent 30 minutes total time today in preparation, patient care, nutritional counseling and education,  and documentation for this visit, including the following: review of most recent clinical lab tests, prescribing medications/ refilling medications, reviewing medical assistant documentation, review and interpretation of bioimpedence results.     Darice Haddock, D.O. DABFM, DABOM [facility] [address] [place], [place] [address] [phone] "

## 2024-05-31 ENCOUNTER — Telehealth: Payer: Self-pay

## 2024-05-31 NOTE — Telephone Encounter (Signed)
 Called to discuss PREP schedule at [facility], tentatively wants to attend next class on 06/08/24, will call to confirm on Friday; assessment visit scheduled for 06/08/24 at 1:15

## 2024-05-31 NOTE — Telephone Encounter (Signed)
 Is interested in joining a PREP Class and is interested in the [place] location. Stacey Castaneda will call with class availability.

## 2024-06-07 ENCOUNTER — Telehealth: Payer: Self-pay

## 2024-09-21 ENCOUNTER — Ambulatory Visit: Admitting: Family Medicine

## 2024-10-13 ENCOUNTER — Ambulatory Visit: Admitting: Family Medicine
